# Patient Record
Sex: Female | Born: 1955 | Race: White | Hispanic: No | Marital: Married | Smoking: Former smoker
Health system: Southern US, Community
[De-identification: ages and names within clinical notes are randomized; demographics above are authoritative.]

## PROBLEM LIST (undated history)

## (undated) DIAGNOSIS — K754 Autoimmune hepatitis: Secondary | ICD-10-CM

## (undated) DIAGNOSIS — R9430 Abnormal result of cardiovascular function study, unspecified: Secondary | ICD-10-CM

## (undated) DIAGNOSIS — G609 Hereditary and idiopathic neuropathy, unspecified: Secondary | ICD-10-CM

## (undated) DIAGNOSIS — E119 Type 2 diabetes mellitus without complications: Secondary | ICD-10-CM

## (undated) DIAGNOSIS — K7581 Nonalcoholic steatohepatitis (NASH): Secondary | ICD-10-CM

## (undated) DIAGNOSIS — IMO0002 Reserved for concepts with insufficient information to code with codable children: Secondary | ICD-10-CM

## (undated) DIAGNOSIS — K219 Gastro-esophageal reflux disease without esophagitis: Secondary | ICD-10-CM

## (undated) DIAGNOSIS — E785 Hyperlipidemia, unspecified: Secondary | ICD-10-CM

## (undated) DIAGNOSIS — Z973 Presence of spectacles and contact lenses: Secondary | ICD-10-CM

## (undated) DIAGNOSIS — M1990 Unspecified osteoarthritis, unspecified site: Secondary | ICD-10-CM

## (undated) DIAGNOSIS — K449 Diaphragmatic hernia without obstruction or gangrene: Secondary | ICD-10-CM

## (undated) HISTORY — DX: Abnormal result of cardiovascular function study, unspecified: R94.30

## (undated) HISTORY — PX: VAGINAL HYSTERECTOMY: SUR661

## (undated) HISTORY — DX: Reserved for concepts with insufficient information to code with codable children: IMO0002

## (undated) HISTORY — PX: NASAL SINUS SURGERY: SHX719

## (undated) HISTORY — PX: CHOLECYSTECTOMY: SHX55

## (undated) HISTORY — DX: Type 2 diabetes mellitus without complications: E11.9

## (undated) HISTORY — PX: ESOPHAGOGASTRODUODENOSCOPY: SHX1529

## (undated) HISTORY — DX: Hyperlipidemia, unspecified: E78.5

---

## 1997-04-04 HISTORY — PX: VAGINAL HYSTERECTOMY: SUR661

## 1997-09-26 ENCOUNTER — Other Ambulatory Visit: Admission: RE | Admit: 1997-09-26 | Discharge: 1997-09-26 | Payer: Self-pay | Admitting: Family Medicine

## 1997-10-10 ENCOUNTER — Ambulatory Visit (HOSPITAL_COMMUNITY): Admission: RE | Admit: 1997-10-10 | Discharge: 1997-10-10 | Payer: Self-pay | Admitting: Obstetrics & Gynecology

## 1997-11-18 ENCOUNTER — Observation Stay (HOSPITAL_COMMUNITY): Admission: RE | Admit: 1997-11-18 | Discharge: 1997-11-19 | Payer: Self-pay | Admitting: Obstetrics & Gynecology

## 1999-02-16 ENCOUNTER — Other Ambulatory Visit: Admission: RE | Admit: 1999-02-16 | Discharge: 1999-02-16 | Payer: Self-pay | Admitting: Obstetrics & Gynecology

## 1999-11-22 ENCOUNTER — Encounter (INDEPENDENT_AMBULATORY_CARE_PROVIDER_SITE_OTHER): Payer: Self-pay

## 1999-11-22 ENCOUNTER — Other Ambulatory Visit: Admission: RE | Admit: 1999-11-22 | Discharge: 1999-11-22 | Payer: Self-pay | Admitting: Otolaryngology

## 2000-06-06 ENCOUNTER — Other Ambulatory Visit: Admission: RE | Admit: 2000-06-06 | Discharge: 2000-06-06 | Payer: Self-pay | Admitting: Obstetrics & Gynecology

## 2002-04-16 ENCOUNTER — Encounter: Payer: Self-pay | Admitting: Family Medicine

## 2002-04-16 ENCOUNTER — Ambulatory Visit (HOSPITAL_COMMUNITY): Admission: RE | Admit: 2002-04-16 | Discharge: 2002-04-16 | Payer: Self-pay | Admitting: Family Medicine

## 2002-05-09 ENCOUNTER — Ambulatory Visit (HOSPITAL_COMMUNITY): Admission: RE | Admit: 2002-05-09 | Discharge: 2002-05-09 | Payer: Self-pay | Admitting: General Surgery

## 2002-05-09 ENCOUNTER — Encounter: Payer: Self-pay | Admitting: General Surgery

## 2002-06-05 ENCOUNTER — Encounter: Payer: Self-pay | Admitting: Gastroenterology

## 2003-04-05 HISTORY — PX: LIVER BIOPSY: SHX301

## 2003-09-02 ENCOUNTER — Other Ambulatory Visit: Admission: RE | Admit: 2003-09-02 | Discharge: 2003-09-02 | Payer: Self-pay | Admitting: Obstetrics & Gynecology

## 2003-09-05 ENCOUNTER — Encounter: Payer: Self-pay | Admitting: Gastroenterology

## 2003-09-15 ENCOUNTER — Encounter: Payer: Self-pay | Admitting: Gastroenterology

## 2003-10-07 ENCOUNTER — Encounter (INDEPENDENT_AMBULATORY_CARE_PROVIDER_SITE_OTHER): Payer: Self-pay | Admitting: Specialist

## 2003-10-07 ENCOUNTER — Ambulatory Visit (HOSPITAL_COMMUNITY): Admission: RE | Admit: 2003-10-07 | Discharge: 2003-10-07 | Payer: Self-pay | Admitting: Gastroenterology

## 2003-10-07 ENCOUNTER — Encounter: Payer: Self-pay | Admitting: Gastroenterology

## 2003-12-25 ENCOUNTER — Encounter: Payer: Self-pay | Admitting: Gastroenterology

## 2004-01-30 ENCOUNTER — Encounter: Admission: RE | Admit: 2004-01-30 | Discharge: 2004-01-30 | Payer: Self-pay | Admitting: Gastroenterology

## 2004-02-05 ENCOUNTER — Ambulatory Visit: Payer: Self-pay | Admitting: Gastroenterology

## 2004-02-17 ENCOUNTER — Ambulatory Visit: Payer: Self-pay | Admitting: Gastroenterology

## 2004-02-19 ENCOUNTER — Encounter: Payer: Self-pay | Admitting: Gastroenterology

## 2004-02-25 ENCOUNTER — Ambulatory Visit: Payer: Self-pay | Admitting: Gastroenterology

## 2004-03-02 ENCOUNTER — Ambulatory Visit: Payer: Self-pay | Admitting: Gastroenterology

## 2004-03-09 ENCOUNTER — Ambulatory Visit: Payer: Self-pay | Admitting: Gastroenterology

## 2004-04-08 ENCOUNTER — Ambulatory Visit: Payer: Self-pay | Admitting: Gastroenterology

## 2004-06-22 ENCOUNTER — Ambulatory Visit: Payer: Self-pay | Admitting: Gastroenterology

## 2004-07-13 ENCOUNTER — Ambulatory Visit: Payer: Self-pay | Admitting: Gastroenterology

## 2005-04-13 ENCOUNTER — Encounter: Admission: RE | Admit: 2005-04-13 | Discharge: 2005-04-19 | Payer: Self-pay | Admitting: Family Medicine

## 2005-10-11 ENCOUNTER — Emergency Department (HOSPITAL_COMMUNITY): Admission: EM | Admit: 2005-10-11 | Discharge: 2005-10-12 | Payer: Self-pay | Admitting: Emergency Medicine

## 2006-04-04 HISTORY — PX: CHOLECYSTECTOMY: SHX55

## 2006-09-25 ENCOUNTER — Emergency Department (HOSPITAL_COMMUNITY): Admission: EM | Admit: 2006-09-25 | Discharge: 2006-09-25 | Payer: Self-pay | Admitting: Emergency Medicine

## 2008-07-08 ENCOUNTER — Encounter: Payer: Self-pay | Admitting: Gastroenterology

## 2008-08-07 ENCOUNTER — Encounter: Payer: Self-pay | Admitting: Gastroenterology

## 2008-09-04 DIAGNOSIS — K449 Diaphragmatic hernia without obstruction or gangrene: Secondary | ICD-10-CM | POA: Insufficient documentation

## 2008-09-04 DIAGNOSIS — E782 Mixed hyperlipidemia: Secondary | ICD-10-CM | POA: Insufficient documentation

## 2008-09-04 DIAGNOSIS — K754 Autoimmune hepatitis: Secondary | ICD-10-CM | POA: Insufficient documentation

## 2008-09-04 DIAGNOSIS — I10 Essential (primary) hypertension: Secondary | ICD-10-CM

## 2008-09-04 DIAGNOSIS — I152 Hypertension secondary to endocrine disorders: Secondary | ICD-10-CM | POA: Insufficient documentation

## 2008-09-04 DIAGNOSIS — E1169 Type 2 diabetes mellitus with other specified complication: Secondary | ICD-10-CM | POA: Insufficient documentation

## 2008-09-04 DIAGNOSIS — E785 Hyperlipidemia, unspecified: Secondary | ICD-10-CM

## 2008-09-04 DIAGNOSIS — K219 Gastro-esophageal reflux disease without esophagitis: Secondary | ICD-10-CM | POA: Insufficient documentation

## 2008-09-05 ENCOUNTER — Ambulatory Visit: Payer: Self-pay | Admitting: Gastroenterology

## 2008-09-05 DIAGNOSIS — R1011 Right upper quadrant pain: Secondary | ICD-10-CM | POA: Insufficient documentation

## 2008-09-05 DIAGNOSIS — R112 Nausea with vomiting, unspecified: Secondary | ICD-10-CM | POA: Insufficient documentation

## 2008-09-05 LAB — CONVERTED CEMR LAB
Ferritin: 70.8 ng/mL (ref 10.0–291.0)
Folate: 9.2 ng/mL
Iron: 146 ug/dL — ABNORMAL HIGH (ref 42–145)
Saturation Ratios: 37.5 % (ref 20.0–50.0)
Transferrin: 278.1 mg/dL (ref 212.0–360.0)
Vitamin B-12: 264 pg/mL (ref 211–911)

## 2008-09-08 ENCOUNTER — Encounter: Payer: Self-pay | Admitting: Gastroenterology

## 2008-09-08 LAB — CONVERTED CEMR LAB
IgA: 253 mg/dL (ref 68–378)
Tissue Transglutaminase Ab, IgA: 1.6 units (ref <7)

## 2008-09-19 ENCOUNTER — Encounter: Payer: Self-pay | Admitting: Gastroenterology

## 2008-09-19 ENCOUNTER — Ambulatory Visit: Payer: Self-pay | Admitting: Gastroenterology

## 2008-09-23 ENCOUNTER — Encounter: Payer: Self-pay | Admitting: Gastroenterology

## 2008-10-03 ENCOUNTER — Ambulatory Visit: Payer: Self-pay | Admitting: Gastroenterology

## 2009-04-27 ENCOUNTER — Encounter: Payer: Self-pay | Admitting: Gastroenterology

## 2010-05-04 NOTE — Letter (Signed)
Summary: [facility]   Imported By: Sherian Rein 05/14/2009 10:49:08  _____________________________________________________________________  External Attachment:    Type:   Image     Comment:   External Document

## 2010-10-23 ENCOUNTER — Other Ambulatory Visit: Payer: Self-pay

## 2010-10-23 ENCOUNTER — Observation Stay (HOSPITAL_COMMUNITY)
Admission: EM | Admit: 2010-10-23 | Discharge: 2010-10-24 | Disposition: A | Discharge status: Home / Self Care | Attending: Internal Medicine | Admitting: Internal Medicine

## 2010-10-23 ENCOUNTER — Emergency Department (HOSPITAL_COMMUNITY)

## 2010-10-23 ENCOUNTER — Encounter: Payer: Self-pay | Admitting: *Deleted

## 2010-10-23 DIAGNOSIS — R079 Chest pain, unspecified: Secondary | ICD-10-CM | POA: Diagnosis present

## 2010-10-23 DIAGNOSIS — K219 Gastro-esophageal reflux disease without esophagitis: Secondary | ICD-10-CM | POA: Diagnosis present

## 2010-10-23 DIAGNOSIS — R0789 Other chest pain: Principal | ICD-10-CM | POA: Insufficient documentation

## 2010-10-23 DIAGNOSIS — E785 Hyperlipidemia, unspecified: Secondary | ICD-10-CM | POA: Insufficient documentation

## 2010-10-23 DIAGNOSIS — E1169 Type 2 diabetes mellitus with other specified complication: Secondary | ICD-10-CM | POA: Diagnosis present

## 2010-10-23 DIAGNOSIS — K754 Autoimmune hepatitis: Secondary | ICD-10-CM | POA: Insufficient documentation

## 2010-10-23 DIAGNOSIS — M7989 Other specified soft tissue disorders: Secondary | ICD-10-CM | POA: Insufficient documentation

## 2010-10-23 DIAGNOSIS — I10 Essential (primary) hypertension: Secondary | ICD-10-CM | POA: Insufficient documentation

## 2010-10-23 DIAGNOSIS — E782 Mixed hyperlipidemia: Secondary | ICD-10-CM | POA: Diagnosis present

## 2010-10-23 HISTORY — DX: Unspecified osteoarthritis, unspecified site: M19.90

## 2010-10-23 HISTORY — DX: Gastro-esophageal reflux disease without esophagitis: K21.9

## 2010-10-23 HISTORY — DX: Autoimmune hepatitis: K75.4

## 2010-10-23 LAB — BASIC METABOLIC PANEL
BUN: 16 mg/dL (ref 6–23)
CO2: 25 mEq/L (ref 19–32)
Chloride: 101 mEq/L (ref 96–112)
Creatinine, Ser: 0.92 mg/dL (ref 0.50–1.10)
GFR calc Af Amer: >60 mL/min (ref >60)
Glucose, Bld: 110 mg/dL — ABNORMAL HIGH (ref 70–99)

## 2010-10-23 LAB — CARDIAC PANEL(CRET KIN+CKTOT+MB+TROPI)
CK, MB: 2.4 ng/mL (ref 0.3–4.0)
Relative Index: 2.3 (ref 0.0–2.5)
Relative Index: INVALID (ref 0.0–2.5)
Total CK: 98 U/L (ref 7–177)
Troponin I: <0.3 ng/mL (ref <0.30)
Troponin I: <0.3 ng/mL (ref <0.30)

## 2010-10-23 LAB — CBC
HCT: 39.5 % (ref 36.0–46.0)
Hemoglobin: 13.5 g/dL (ref 12.0–15.0)
MCV: 96.6 fL (ref 78.0–100.0)
RDW: 12.8 % (ref 11.5–15.5)
WBC: 6.2 10*3/uL (ref 4.0–10.5)

## 2010-10-23 MED ORDER — SODIUM CHLORIDE 0.9 % IJ SOLN: 3.0000 mL | INTRAMUSCULAR | Status: DC | PRN

## 2010-10-23 MED ORDER — ACETAMINOPHEN 650 MG RE SUPP: 650.0000 mg | Freq: Four times a day (QID) | RECTAL | Status: DC | PRN

## 2010-10-23 MED ORDER — ACETAMINOPHEN 325 MG PO TABS: 650.0000 mg | ORAL_TABLET | Freq: Four times a day (QID) | ORAL | Status: DC | PRN

## 2010-10-23 MED ORDER — SODIUM CHLORIDE 0.9 % IJ SOLN
3.0000 mL | Freq: Two times a day (BID) | INTRAMUSCULAR | Status: DC
  Administered 2010-10-23: 3 mL via INTRAVENOUS
  Filled 2010-10-23: qty 3

## 2010-10-23 MED ORDER — SENNA 8.6 MG PO TABS: 2.0000 | ORAL_TABLET | Freq: Every day | ORAL | Status: DC | PRN

## 2010-10-23 MED ORDER — ZOLPIDEM TARTRATE 5 MG PO TABS: 5.0000 mg | ORAL_TABLET | Freq: Every evening | ORAL | Status: DC | PRN

## 2010-10-23 MED ORDER — ASPIRIN 325 MG PO TABS
325.0000 mg | ORAL_TABLET | ORAL | Status: AC
  Administered 2010-10-23: 325 mg via ORAL
  Filled 2010-10-23: qty 1

## 2010-10-23 MED ORDER — ASPIRIN 81 MG PO CHEW: 324.0000 mg | CHEWABLE_TABLET | Freq: Once | ORAL | Status: DC

## 2010-10-23 MED ORDER — PANTOPRAZOLE SODIUM 40 MG PO TBEC
40.0000 mg | DELAYED_RELEASE_TABLET | Freq: Two times a day (BID) | ORAL | Status: DC
  Administered 2010-10-24: 40 mg via ORAL
  Filled 2010-10-23: qty 1

## 2010-10-23 MED ORDER — MERCAPTOPURINE 50 MG PO TABS
50.0000 mg | ORAL_TABLET | Freq: Every day | ORAL | Status: DC
  Administered 2010-10-23: 50 mg via ORAL
  Filled 2010-10-23 (×3): qty 1

## 2010-10-23 MED ORDER — ONDANSETRON HCL 4 MG/2ML IJ SOLN: 4.0000 mg | Freq: Four times a day (QID) | INTRAMUSCULAR | Status: DC | PRN

## 2010-10-23 MED ORDER — ONDANSETRON HCL 4 MG PO TABS: 4.0000 mg | ORAL_TABLET | Freq: Four times a day (QID) | ORAL | Status: DC | PRN

## 2010-10-23 MED ORDER — SODIUM CHLORIDE 0.9 % IV SOLN: 250.0000 mL | INTRAVENOUS | Status: DC

## 2010-10-23 NOTE — ED Provider Notes (Signed)
Evaluation and management procedures were performed by the PA/NP under my supervision/collaboration.     Dione Booze, MD 10/23/10 [phone]

## 2010-10-23 NOTE — ED Provider Notes (Signed)
History     Chief Complaint  Patient presents with  . Chest Pain   HPI Comments: Pt reports problem with fleeting chest pain for the past 2 months that were mild, would come, last 40 to 45 sec, and go away. Today she felt the mild pain followed by a much more severe pain with tightness that gradually went away with rest. She go up to finish working in the kitchen and the more severe pain with tightness came again. She had mild to mod shortness of breath. No diaphoresis. No vomiting, and no LOC.  Patient is a 55 y.o. female presenting with chest pain. The history is provided by the patient.  Chest Pain The chest pain began 1 - 2 hours ago. Chest pain occurs constantly. The chest pain is unchanged. Associated with: working in [employer]. The severity of the pain is moderate. The quality of the pain is described as sharp and tightness. The pain does not radiate. Pertinent negatives for primary symptoms include no shortness of breath, no cough, no wheezing, no palpitations, no abdominal pain and no dizziness.  Pertinent negatives for past medical history include no seizures.     Past Medical History  Diagnosis Date  . Autoimmune hepatitis   . GERD (gastroesophageal reflux disease)     Past Surgical History  Procedure Date  . Abdominal hysterectomy   . Cholecystectomy   . Nasal sinus surgery     History reviewed. No pertinent family history.  History  Substance Use Topics  . Smoking status: Never Smoker   . Smokeless tobacco: Not on file  . Alcohol Use: No    OB History    Grav Para Term Preterm Abortions TAB SAB Ect Mult Living                  Review of Systems  Constitutional: Negative for activity change.       All ROS Neg except as noted in HPI  HENT: Negative for nosebleeds and neck pain.   Eyes: Negative for photophobia and discharge.  Respiratory: Positive for chest tightness. Negative for cough, shortness of breath and wheezing.   Cardiovascular: Positive for chest  pain and leg swelling. Negative for palpitations.  Gastrointestinal: Negative for abdominal pain and blood in stool.       Indigestion  Genitourinary: Negative for dysuria, frequency and hematuria.  Musculoskeletal: Negative for back pain and arthralgias.  Skin: Negative.   Neurological: Negative for dizziness, seizures and speech difficulty.  Psychiatric/Behavioral: Negative for hallucinations and confusion.    Physical Exam  BP 153/64  Pulse 78  Resp 16  Ht 5' 5.5" (1.664 m)  Wt 220 lb (99.791 kg)  BMI 36.05 kg/m2  SpO2 98%  Physical Exam  Nursing note and vitals reviewed. Constitutional: She is oriented to person, place, and time. She appears well-developed and well-nourished.  Non-toxic appearance.  HENT:  Head: Normocephalic.  Right Ear: Tympanic membrane and external ear normal.  Left Ear: Tympanic membrane and external ear normal.  Eyes: EOM and lids are normal. Pupils are equal, round, and reactive to light.  Neck: Normal range of motion. Neck supple. Carotid bruit is not present.       No carotid bruits  Cardiovascular: Normal rate, regular rhythm, normal heart sounds, intact distal pulses and normal pulses.   Pulmonary/Chest: Breath sounds normal. No respiratory distress. She exhibits no tenderness.  Abdominal: Soft. Bowel sounds are normal. There is no tenderness. There is no guarding.  Musculoskeletal: Normal range of motion.  She exhibits no edema.  Lymphadenopathy:       Head (right side): No submandibular adenopathy present.       Head (left side): No submandibular adenopathy present.    She has no cervical adenopathy.  Neurological: She is alert and oriented to person, place, and time. She has normal strength. No cranial nerve deficit or sensory deficit.  Skin: Skin is warm and dry.  Psychiatric: She has a normal mood and affect. Her speech is normal.    ED Course  Procedures  MDM I have reviewed nursing notes, vital signs, and all appropriate lab and  imaging results for this patient.      Kathie Dike, [place] 10/23/10 1441

## 2010-10-23 NOTE — H&P (Signed)
Taylor Leach [id number] 12/22/1955  Referring physician: PCP: Edilia Bo, PA   Chief Complaint: Chest Pain  HPI: 55 year old female with past medical history of GERD hyperlipidemia also with  autoimmune hepatitis that comes in for atypical chest pain. She relates that for the past 4 days she's been having intermittent chest pains. On her left side. She relates this morning when she was making breakfast she started having these intermittent chest spasms. Resting for about 30 minutes. She relates she was not doing any heavy exertion, she relates nothing makes it better, nothing makes it worse. She has no recent viral  infections. She has no shortness of breath, palpitation, dizziness, nausea, no recent sick contacts and no change in her vision.  Past Medical History  Diagnosis Date  . Autoimmune hepatitis   . GERD (gastroesophageal reflux disease)    Past Surgical History  Procedure Date  . Abdominal hysterectomy   . Cholecystectomy   . Nasal sinus surgery    Social History:  reports that she has never smoked. She does not have any smokeless tobacco history on file. She reports that she drinks about .6 ounces of alcohol per week. She reports that she does not use illicit drugs. Allergies:  Allergies  Allergen Reactions  . Hydrocodone    Family History  Problem Relation Age of Onset  . Cancer Mother   . Cancer Father    Prior to Admission medications   Medication Sig Start Date End Date Taking? Authorizing Provider  mercaptopurine (PURINETHOL) 50 MG tablet Take 50 mg by mouth daily. Give on an empty stomach 1 hour before or 2 hours after meals. Caution: Chemotherapy.    Yes Historical Provider, MD  naproxen (NAPROSYN) 500 MG tablet Take 500 mg by mouth 2 (two) times daily with a meal.     Yes Historical Provider, MD  omeprazole (PRILOSEC) 20 MG capsule Take 20 mg by mouth daily.     Yes Historical Provider, MD   Review of Systems:  10 point review of systems aren't pertinent  positive per history of present illness.  Physical Exam: General: Lying comfortably in bed in no acute distress Eyes: Anicteric pallor ENT: Moist mucous membranes, no discharges Neck: no JVD no bruits no thyroid Cardiovascular: Shows regular rate and rhythm with positive S1-S2 no tenderness to palpation of her chest. Respiratory: Good air movement clear to auscultation Abdomen: Positive bowel sounds nontender nondistended soft. Skin: No rashes ulcerations Musculoskeletal: Moving all 4 extremities without any difficulty. No joint deformities  Neurologic: Alert and oriented x4 coherent for language and nonfocal.  Labs on Admission:  Results for orders placed during the hospital encounter of 10/23/10 (from the past 24 hour(s))  CBC     Status: Normal   Collection Time   10/23/10 12:23 PM      Component Value Range   WBC 6.2  4.0 - 10.5 (K/uL)   RBC 4.09  3.87 - 5.11 (MIL/uL)   Hemoglobin 13.5  12.0 - 15.0 (g/dL)   HCT 40.9  81.1 - 91.4 (%)   MCV 96.6  78.0 - 100.0 (fL)   MCH 33.0  26.0 - 34.0 (pg)   MCHC 34.2  30.0 - 36.0 (g/dL)   RDW 78.2  95.6 - 21.3 (%)   Platelets 248  150 - 400 (K/uL)  BASIC METABOLIC PANEL     Status: Abnormal   Collection Time   10/23/10 12:23 PM      Component Value Range   Sodium 136  135 -  145 (mEq/L)   Potassium 3.8  3.5 - 5.1 (mEq/L)   Chloride 101  96 - 112 (mEq/L)   CO2 25  19 - 32 (mEq/L)   Glucose, Bld 110 (*) 70 - 99 (mg/dL)   BUN 16  6 - 23 (mg/dL)   Creatinine, Ser 2.13  0.50 - 1.10 (mg/dL)   Calcium 9.3  8.4 - 08.6 (mg/dL)   GFR calc non Af Amer >60  >60 (mL/min)   GFR calc Af Amer >60  >60 (mL/min)  CARDIAC PANEL(CRET KIN+CKTOT+MB+TROPI)     Status: Normal   Collection Time   10/23/10 12:23 PM      Component Value Range   Total CK 115  7 - 177 (U/L)   CK, MB 2.7  0.3 - 4.0 (ng/mL)   Troponin I <0.30  <0.30 (ng/mL)   Relative Index 2.3  0.0 - 2.5   CARDIAC PANEL(CRET KIN+CKTOT+MB+TROPI)     Status: Normal   Collection Time   10/23/10   3:10 PM      Component Value Range   Total CK 98  7 - 177 (U/L)   CK, MB 2.4  0.3 - 4.0 (ng/mL)   Troponin I <0.30  <0.30 (ng/mL)   Relative Index RELATIVE INDEX IS INVALID  0.0 - 2.5     Radiological Exams on Admission: No results found.  EKG:  shows normal sinus rhythm good R-wave progression normal axis no T wave inversion no ST segment changes.   Assessment/Plan 1. Atypical chest pain 55 year old female with past medical history of autoimmune hepatitis and GERD. The most likely cause of chest pain is GERD. She is on a PPI. I would admit her for 23 hour observation. Cycle cardiac enzymes repeat an EKG in the morning. I will increase her protonix to twice a day. And watch how she does overnight. If everything comes back within normal limits. We'll schedule a stress as an outpatient. 2. GERD increase PPI to twice a day 3. auto immune hepatitis. Continue current meds.    Code Status: Family Communication: Disposition Plan:     Marinda Elk MD 10/23/2010, 5:24 PM

## 2010-10-23 NOTE — ED Notes (Signed)
Patient is resting comfortably. 

## 2010-10-23 NOTE — ED Notes (Signed)
remains pain free, for admission

## 2010-10-23 NOTE — Progress Notes (Signed)
Case discussed wth Dr Chryl Heck. He will see pt in ED for admission. Pt sitting in room with family. No distress. No c/o pain at this time.

## 2010-10-23 NOTE — ED Notes (Signed)
Pt states intermittent, non radiating  pain to center of chest. Described as "tightening up for about 30-45 seconds, then goes away"  NAD. Denies SOB

## 2010-10-23 NOTE — ED Notes (Signed)
Remains pain free. Talking w/family, waiting for re-eval

## 2010-10-23 NOTE — ED Notes (Signed)
To room via stretcher

## 2010-10-23 NOTE — ED Notes (Signed)
Date: 10/23/2010  Rate: 80  Rhythm: normal sinus rhythm  QRS Axis: normal  Intervals: normal  ST/T Wave abnormalities: normal  Conduction Disutrbances:none  Narrative Interpretation: Normal ECG  Old EKG Reviewed: none available   Dione Booze, MD 10/23/10 1554

## 2010-10-23 NOTE — ED Notes (Signed)
edpa in with pt 

## 2010-10-23 NOTE — ED Notes (Signed)
Patient denies pain and is resting comfortably.  

## 2010-10-24 ENCOUNTER — Other Ambulatory Visit: Payer: Self-pay

## 2010-10-24 LAB — CARDIAC PANEL(CRET KIN+CKTOT+MB+TROPI)
CK, MB: 1.9 ng/mL (ref 0.3–4.0)
CK, MB: 1.8 ng/mL (ref 0.3–4.0)
Relative Index: INVALID (ref 0.0–2.5)
Relative Index: INVALID (ref 0.0–2.5)
Total CK: 69 U/L (ref 7–177)
Total CK: 65 U/L (ref 7–177)

## 2010-10-24 LAB — LIPID PANEL
Cholesterol: 212 mg/dL — ABNORMAL HIGH (ref 0–200)
HDL: 52 mg/dL (ref >39)
Total CHOL/HDL Ratio: 4.1 RATIO

## 2010-10-24 MED ORDER — ASPIRIN 81 MG PO CHEW: 81.0000 mg | CHEWABLE_TABLET | Freq: Once | ORAL | Status: DC

## 2010-10-24 MED ORDER — OMEPRAZOLE 20 MG PO CPDR: 40.0000 mg | DELAYED_RELEASE_CAPSULE | Freq: Two times a day (BID) | ORAL | Status: DC

## 2010-10-24 MED ORDER — SENNA 8.6 MG PO TABS: 2.0000 | ORAL_TABLET | Freq: Every day | ORAL | Status: DC | PRN

## 2010-10-24 NOTE — Progress Notes (Signed)
Evaluation and management procedures were performed by the PA/NP under my supervision/collaboration.   

## 2010-10-24 NOTE — Progress Notes (Signed)
10/24/10 0935 Patient discharged home this morning. Reviewed discharge instructions with patient, given copy of instructions, med list, prescription, carenotes, f/u appointment information. Pt to f/u with cardiology for outpatient echo. Denies pain or discomfort this am, verbalized understanding of instructions. Telemetry and IV site d/c'd per nurse tech this am, site within normal limits. Pt left floor in stable condition via ambulation, accompanied by nurse tech. Lewis Shock, RN

## 2010-10-24 NOTE — Discharge Summary (Signed)
Taylor Leach MRN: [id number] DOB/AGE: [birth year] 55 y.o.  Admit date: 11/10/2010 Discharge date: 10/26/2010  Primary Care Physician:  Edilia Bo, PA   Discharge Diagnoses:   Patient Active Problem List  Diagnoses  . HYPERLIPIDEMIA  . HYPERTENSION  . HIATAL HERNIA  . AUTOIMMUNE HEPATITIS  . NAUSEA AND VOMITING  . NAUSEA  . RUQ PAIN  . Autoimmune hepatitis    DISCHARGE MEDICATION: Discharge Medication List as of 10/24/2010  9:13 AM    START taking these medications   Details  aspirin 81 MG chewable tablet Chew 1 tablet (81 mg total) by mouth once., Starting 10/24/2010, Until Mon 10/24/11, OTC    senna (SENOKOT) 8.6 MG TABS Take 2 tablets (17.2 mg total) by mouth daily as needed., Starting 10/24/2010, Until Discontinued, OTC      CONTINUE these medications which have CHANGED   Details  omeprazole (PRILOSEC) 20 MG capsule Take 2 capsules (40 mg total) by mouth 2 (two) times daily., Starting 10/24/2010, Until Discontinued, OTC      CONTINUE these medications which have NOT CHANGED   Details  fluticasone (FLONASE) 50 MCG/ACT nasal spray Place 2 sprays into the nose daily as needed. Sinus Allergies , Until Discontinued, Historical Med    mercaptopurine (PURINETHOL) 50 MG tablet Take 50 mg by mouth daily. Give on an empty stomach 1 hour before or 2 hours after meals. Caution: Chemotherapy., Until Discontinued, Historical Med      STOP taking these medications     naproxen (NAPROSYN) 500 MG tablet      naproxen (NAPROSYN) 500 MG tablet            Consults: Treatment Team:  [facility]   SIGNIFICANT DIAGNOSTIC STUDIES:  Chest Portable 1 View  11-10-2010  *RADIOLOGY REPORT*  Clinical Data: Chest pain.  PORTABLE CHEST - 1 VIEW  Comparison: 10/11/2005  Findings: Low lung volumes are present, causing crowding of the pulmonary vasculature.  Cardiac and mediastinal contours appear normal.  The lungs appear clear.  No pleural effusion is identified.  IMPRESSION:  No  significant abnormality identified.  Original Report Authenticated By: Dellia Cloud, M.D.           BRIEF ADMITTING H & P;  55 year old female with past medical history of GERD hyperlipidemia also with autoimmune hepatitis that comes in for atypical chest pain. She relates that for the past 4 days she's been having intermittent chest pains. On her left side. She relates this morning when she was making breakfast she started having these intermittent chest spasms. Resting for about 30 minutes. She relates she was not doing any heavy exertion, she relates nothing makes it better, nothing makes it worse. She has no recent viral infections. She has no shortness of breath, palpitation, dizziness, nausea, no recent sick contacts and no change in her vision.    Hospital Course:  Hospital Course by problem list: 1. Chest pain, 55 year old female with past medical history of GERD also autoimmune hepatitis was moved to the floor for atypical chest pains on telemetry strip she had no events. EKG showed normal sinus rhythm EKG revealed T wave inversion. Cardiac enzymes negative x4.  Will followup with Dr. With cardiology as an outpatient for a stress test. 2. HYPERLIPIDEMIA, labs pending will followup with cardiology. 3. GERD, take omeprazole 40 mg twice a day. Stop taking naproxen. Use Tylenol when necessary.  Disposition and Follow-up:  Discharge Orders    Future Appointments: Provider: Department: Dept Phone: Center:   12/10/2010 2:00 PM Tinnie Gens  Sedonia Small, MD [facility] [phone] [facility]     Future Orders Please Complete By Expires   Diet general      Increase activity slowly      Discharge instructions      Comments:   Taking NSAIDs. (Like naproxen) Increase activity as tolerated   Call MD for:      Scheduling Instructions:   With her primary care doctor as routine.      DISCHARGE EXAM:  BP 123/66  Pulse 63  Temp(Src) 98.1 F (36.7 C) (Oral)  Resp 16  Ht 5\' 5"   (1.651 m)  Wt 102.4 kg (225 lb 12 oz)  BMI 37.57 kg/m2  SpO2 97%  General: Lying comfortably in bed in no acute distress  Eyes: Anicteric pallor  ENT: Moist mucous membranes, no discharges  Neck: no JVD no bruits no thyroid  Cardiovascular: Shows regular rate and rhythm with positive S1-S2 no tenderness to palpation of her chest.  Respiratory: Good air movement clear to auscultation  Abdomen: Positive bowel sounds nontender nondistended soft.  Skin: No rashes ulcerations  Musculoskeletal: Moving all 4 extremities without any difficulty. No joint deformities  Neurologic: Alert and oriented x4 coherent for language     Labs on discharge: Cardiac markers continue to be negative.      Signed: Marinda Elk 10/26/2010, 1:25 PM

## 2010-12-02 NOTE — Progress Notes (Signed)
Encounter addended by: Clarene Critchley on: 12/02/2010  6:55 AM<BR>     Documentation filed: Flowsheet VN

## 2010-12-08 ENCOUNTER — Encounter: Payer: Self-pay | Admitting: Cardiology

## 2010-12-10 ENCOUNTER — Ambulatory Visit: Payer: Self-pay | Admitting: Cardiology

## 2010-12-10 NOTE — Progress Notes (Signed)
Encounter addended by: Karen Kays on: 12/10/2010 10:10 AM<BR>     Documentation filed: Flowsheet VN

## 2011-01-03 ENCOUNTER — Encounter: Payer: Self-pay | Admitting: Cardiology

## 2011-01-05 ENCOUNTER — Encounter: Payer: Self-pay | Admitting: Cardiology

## 2011-01-07 ENCOUNTER — Encounter: Payer: Self-pay | Admitting: Cardiology

## 2011-01-07 ENCOUNTER — Other Ambulatory Visit: Payer: Self-pay | Admitting: Cardiology

## 2011-01-07 ENCOUNTER — Telehealth: Payer: Self-pay | Admitting: *Deleted

## 2011-01-07 ENCOUNTER — Ambulatory Visit (INDEPENDENT_AMBULATORY_CARE_PROVIDER_SITE_OTHER): Admitting: Cardiology

## 2011-01-07 ENCOUNTER — Encounter: Payer: Self-pay | Admitting: *Deleted

## 2011-01-07 DIAGNOSIS — R079 Chest pain, unspecified: Secondary | ICD-10-CM | POA: Insufficient documentation

## 2011-01-07 DIAGNOSIS — E785 Hyperlipidemia, unspecified: Secondary | ICD-10-CM

## 2011-01-07 DIAGNOSIS — I10 Essential (primary) hypertension: Secondary | ICD-10-CM

## 2011-01-07 NOTE — Progress Notes (Signed)
HPI The patient is seen today as a new patient.  She had been hospitalized overnight at [facility]  for evaluation of chest pain. There was no MI.  There is a plan for her to be seen by cardiology as an outpatient.  This was delayed because of illness with her husband.  She's been stable.  She does continue to have chest discomfort.  It is random.  It is not related to exertion.  She does not have any at rest at night.  There is no shortness of breath.  There is no nausea vomiting or diaphoresis.  Patient does have a history of autoimmune hepatitis and she is treated for this.  There is also hypertension and hyperlipidemia.  As part of his evaluation I have reviewed all the data available to me including her discharge summary from the hospital in July.  Allergies  Allergen Reactions  . Hydrocodone Nausea And Vomiting and Other (See Comments)    Makes body hot.     Current Outpatient Prescriptions  Medication Sig Dispense Refill  . fluticasone (FLONASE) 50 MCG/ACT nasal spray Place 2 sprays into the nose daily as needed. Sinus Allergies       . ibuprofen (ADVIL,MOTRIN) 200 MG tablet Take 200 mg by mouth every 6 (six) hours as needed.        . mercaptopurine (PURINETHOL) 50 MG tablet Take 50 mg by mouth daily. Give on an empty stomach 1 hour before or 2 hours after meals. Caution: Chemotherapy.      Marland Kitchen omeprazole (PRILOSEC) 20 MG capsule Take 2 capsules (40 mg total) by mouth 2 (two) times daily.        History   Social History  . Marital Status: Married    Spouse Name: N/A    Number of Children: N/A  . Years of Education: N/A   Occupational History  . Not on file.   Social History Main Topics  . Smoking status: Former Smoker -- 1.0 packs/day  . Smokeless tobacco: Not on file  . Alcohol Use: 0.6 oz/week    1 Glasses of wine per week  . Drug Use: No  . Sexually Active: Yes    Birth Control/ Protection: Surgical   Other Topics Concern  . Not on file   Social History Narrative    . No narrative on file    Family History  Problem Relation Age of Onset  . Cancer Mother   . Cancer Father     Past Medical History  Diagnosis Date  . Autoimmune hepatitis   . GERD (gastroesophageal reflux disease)   . Arthritis   . Chest pain   . Dyslipidemia     Past Surgical History  Procedure Date  . Cholecystectomy   . Nasal sinus surgery   . Vaginal hysterectomy     ROS  Patient denies fever, chills, headache, sweats, rash, change in vision, change in hearing, chest pain, cough, nausea vomiting, urinary symptoms.  All other systems are reviewed and are negative.  PHYSICAL EXAM Patient is overweight and quite stable.  She is oriented to person time and place.  Affect is normal.  Head is atraumatic.  There is no xanthelasma.  There is no jugular venous distention.  Lungs are clear.  Respiratory effort is nonlabored.  Cardiac exam reveals S1 and S2.  There are no clicks or significant murmurs.  The abdomen is soft there is no peripheral edema.  No musculoskeletal deformities.  No skin rashes  Filed Vitals:  01/07/11 0820  BP: 131/74  Pulse: 73  Resp: 16  Height: 5\' 5"  (1.651 m)  Weight: 221 lb (100.245 kg)    EKG Hospital dated to light the 22nd 2012 is reviewed.  It was normal.  I have reviewed EKG as she has had some chest discomfort since that time.  I reviewed today's EKG.  It is unchanged.  It is normal. ASSESSMENT & PLAN

## 2011-01-07 NOTE — Assessment & Plan Note (Signed)
Patient has only hepatitis.  She does not have proven coronary disease.  I presume this is why she is not on a statin

## 2011-01-07 NOTE — Assessment & Plan Note (Signed)
Blood pressure is controlled. No change in therapy. 

## 2011-01-07 NOTE — Patient Instructions (Addendum)
   Echo  Stress Echo  If the results of your test are normal or stable, you will receive a letter.  If they are abnormal, the nurse will contact you by phone. Follow up in  3 weeks - see above

## 2011-01-07 NOTE — Telephone Encounter (Signed)
No precert required 

## 2011-01-07 NOTE — Assessment & Plan Note (Signed)
Patient is seen for evaluation of chest pain.  When she was in the hospital briefly overnight there was no enzyme abnormality.  She continues to have some mild symptoms.  We do not have any echo data or exercise at this point.  I believe that an echo and a stress echo would be the most appropriate approach and this will be arranged.

## 2011-01-07 NOTE — Telephone Encounter (Signed)
Stress Echo and 2 D echo scheduled for 01-14-2011 @ [facility] Checking percert

## 2011-01-14 DIAGNOSIS — R072 Precordial pain: Secondary | ICD-10-CM

## 2011-01-19 LAB — URINE CULTURE
Colony Count: NO GROWTH
Culture: NO GROWTH

## 2011-01-19 LAB — COMPREHENSIVE METABOLIC PANEL
ALT: 67 — ABNORMAL HIGH
AST: 30
Albumin: 3.2 — ABNORMAL LOW
Alkaline Phosphatase: 42
BUN: 12
CO2: 27
Calcium: 8.9
Chloride: 104
Creatinine, Ser: 0.89
GFR calc Af Amer: >60
GFR calc non Af Amer: >60
Glucose, Bld: 137 — ABNORMAL HIGH
Potassium: 3.9
Sodium: 135
Total Bilirubin: 0.9
Total Protein: 6

## 2011-01-19 LAB — DIFFERENTIAL
Band Neutrophils: 0
Basophils Relative: 0
Eosinophils Relative: 1
Metamyelocytes Relative: 0
Myelocytes: 0
Neutrophils Relative %: 75

## 2011-01-19 LAB — CBC
HCT: 41.7
Hemoglobin: 14.1
MCHC: 33.7
MCV: 106.4 — ABNORMAL HIGH
Platelets: 255
RBC: 3.92
RDW: 14.3 — ABNORMAL HIGH
WBC: 7

## 2011-01-19 LAB — URINE MICROSCOPIC-ADD ON

## 2011-01-19 LAB — URINALYSIS, ROUTINE W REFLEX MICROSCOPIC
Bilirubin Urine: NEGATIVE
Glucose, UA: NEGATIVE
Ketones, ur: NEGATIVE
Protein, ur: NEGATIVE
pH: 5.5

## 2011-02-17 ENCOUNTER — Encounter: Payer: Self-pay | Admitting: Cardiology

## 2011-02-18 ENCOUNTER — Encounter: Payer: Self-pay | Admitting: Cardiology

## 2011-02-18 ENCOUNTER — Ambulatory Visit: Admitting: Cardiology

## 2012-04-04 HISTORY — PX: RECTOCELE REPAIR: SHX761

## 2012-08-26 ENCOUNTER — Emergency Department (HOSPITAL_COMMUNITY)

## 2012-08-26 ENCOUNTER — Emergency Department (HOSPITAL_COMMUNITY)
Admission: EM | Admit: 2012-08-26 | Discharge: 2012-08-26 | Disposition: A | Discharge status: Home / Self Care | Attending: Emergency Medicine | Admitting: Emergency Medicine

## 2012-08-26 ENCOUNTER — Encounter (HOSPITAL_COMMUNITY): Payer: Self-pay | Admitting: Emergency Medicine

## 2012-08-26 DIAGNOSIS — Z862 Personal history of diseases of the blood and blood-forming organs and certain disorders involving the immune mechanism: Secondary | ICD-10-CM | POA: Insufficient documentation

## 2012-08-26 DIAGNOSIS — H539 Unspecified visual disturbance: Secondary | ICD-10-CM | POA: Insufficient documentation

## 2012-08-26 DIAGNOSIS — Z79899 Other long term (current) drug therapy: Secondary | ICD-10-CM | POA: Insufficient documentation

## 2012-08-26 DIAGNOSIS — K219 Gastro-esophageal reflux disease without esophagitis: Secondary | ICD-10-CM | POA: Insufficient documentation

## 2012-08-26 DIAGNOSIS — R200 Anesthesia of skin: Secondary | ICD-10-CM

## 2012-08-26 DIAGNOSIS — IMO0001 Reserved for inherently not codable concepts without codable children: Secondary | ICD-10-CM | POA: Insufficient documentation

## 2012-08-26 DIAGNOSIS — Z8719 Personal history of other diseases of the digestive system: Secondary | ICD-10-CM | POA: Insufficient documentation

## 2012-08-26 DIAGNOSIS — Z8739 Personal history of other diseases of the musculoskeletal system and connective tissue: Secondary | ICD-10-CM | POA: Insufficient documentation

## 2012-08-26 DIAGNOSIS — Z8639 Personal history of other endocrine, nutritional and metabolic disease: Secondary | ICD-10-CM | POA: Insufficient documentation

## 2012-08-26 DIAGNOSIS — R202 Paresthesia of skin: Secondary | ICD-10-CM

## 2012-08-26 DIAGNOSIS — R110 Nausea: Secondary | ICD-10-CM | POA: Insufficient documentation

## 2012-08-26 DIAGNOSIS — Z87891 Personal history of nicotine dependence: Secondary | ICD-10-CM | POA: Insufficient documentation

## 2012-08-26 DIAGNOSIS — R209 Unspecified disturbances of skin sensation: Secondary | ICD-10-CM | POA: Insufficient documentation

## 2012-08-26 DIAGNOSIS — M79609 Pain in unspecified limb: Secondary | ICD-10-CM | POA: Insufficient documentation

## 2012-08-26 DIAGNOSIS — Z8679 Personal history of other diseases of the circulatory system: Secondary | ICD-10-CM | POA: Insufficient documentation

## 2012-08-26 LAB — CBC WITH DIFFERENTIAL/PLATELET
Basophils Absolute: 0 10*3/uL (ref 0.0–0.1)
Basophils Relative: 1 % (ref 0–1)
Eosinophils Relative: 7 % — ABNORMAL HIGH (ref 0–5)
HCT: 38.3 % (ref 36.0–46.0)
MCHC: 34.7 g/dL (ref 30.0–36.0)
Monocytes Absolute: 0.9 10*3/uL (ref 0.1–1.0)
Neutro Abs: 5 10*3/uL (ref 1.7–7.7)
Platelets: 252 10*3/uL (ref 150–400)
RDW: 12.3 % (ref 11.5–15.5)

## 2012-08-26 LAB — BASIC METABOLIC PANEL
BUN: 17 mg/dL (ref 6–23)
CO2: 27 mEq/L (ref 19–32)
Calcium: 9.4 mg/dL (ref 8.4–10.5)
Glucose, Bld: 166 mg/dL — ABNORMAL HIGH (ref 70–99)
Sodium: 137 mEq/L (ref 135–145)

## 2012-08-26 LAB — TROPONIN I: Troponin I: <0.3 ng/mL (ref <0.30)

## 2012-08-26 MED ORDER — ASPIRIN 81 MG PO CHEW
324.0000 mg | CHEWABLE_TABLET | Freq: Once | ORAL | Status: AC
  Administered 2012-08-26: 324 mg via ORAL
  Filled 2012-08-26: qty 4

## 2012-08-26 MED ORDER — SODIUM CHLORIDE 0.9 % IV SOLN
Freq: Once | INTRAVENOUS | Status: AC
  Administered 2012-08-26: 1000 mL via INTRAVENOUS

## 2012-08-26 MED ORDER — NAPROXEN 500 MG PO TABS: 500.0000 mg | ORAL_TABLET | Freq: Two times a day (BID) | ORAL | Status: DC

## 2012-08-26 NOTE — ED Notes (Signed)
Patient denies chest pain and states that numbness has now resolved.  Patient remains A&O; follows commands.

## 2012-08-26 NOTE — ED Notes (Signed)
Patient states she was walking around [employer] at 1730 today and her left arm began hurting.  Patient states lip became numb and her left hand/fingers became numb.  A&O; skin w/d. Respirations even and unlabored.  Grips and pedal pushes equal.

## 2012-08-26 NOTE — ED Provider Notes (Signed)
History  This chart was scribed for Vida Roller, MD by Bennett Scrape, ED Scribe. This patient was seen in room [facility]2/[facility] and the patient's care was started at 8:14 PM.  CSN: [id number]  Arrival date & time 08/26/12  1947   First MD Initiated Contact with Patient 08/26/12 2007      Chief Complaint  Patient presents with  . Arm Pain  . Numbness    The history is provided by the patient. No language interpreter was used.    HPI Comments: Taylor Leach is a 57 y.o. female who presents to the Emergency Department complaining of gradual onset, gradually worsening, constant pain in the middle of her left arm which began three hours ago while she was shopping. She describes the pain as "throbbing like a tooth ache." She reports that after the pain in her left arm began, she also experienced associated symptoms of numbness in her lips,  tingling in the 4th and 5th fingers of her left hand, nausea, and  minor discomfort in her left jaw. She denies that increased activity increases the pain her arm, causes her to feel SOB, or fatigues her. She also reports that for the past several weeks she has experienced blurred vision in her left eye, and that for months she has experienced neuropathy in her feet although yesterday the neuropathy in her large toe increased. She also denies headaches and leg swelling bilaterally. She states that a year ago she had a prior episode of chest pain which was attributed to acid reflux, and she had a subsequent stress test which did not indicate any cardiac disease.  The pt reports that her A1C is 6.5, and she does not take medication for diabetes due to its side effects. She states that the only medication she takes is motrin. She denies a h/o HTN. 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  She is a former smoker and drinks a glass of wine a week.   Past Medical History  Diagnosis Date  . Autoimmune hepatitis   . GERD (gastroesophageal  reflux disease)   . Arthritis   . Chest pain     Stress echo normal, October, 2012  . Dyslipidemia   . Ejection fraction     EF 55-60%, echo, October, 2012    Past Surgical History  Procedure Laterality Date  . Cholecystectomy    . Nasal sinus surgery    . Vaginal hysterectomy      Family History  Problem Relation Age of Onset  . Cancer Mother   . Cancer Father     History  Substance Use Topics  . Smoking status: Former Smoker -- 1.00 packs/day  . Smokeless tobacco: Not on file  . Alcohol Use: 0.6 oz/week    1 Glasses of wine per week    No OB history.   Review of Systems  Eyes: Positive for visual disturbance.  Gastrointestinal: Positive for nausea. Negative for vomiting.  Musculoskeletal: Positive for myalgias. Negative for back pain.  Neurological: Positive for numbness. Negative for headaches.  All other systems reviewed and are negative.    Allergies  Hydrocodone  Home Medications   Current Outpatient Rx  Name  Route  Sig  Dispense  Refill  . fluticasone (FLONASE) 50 MCG/ACT nasal spray   Nasal   Place 2 sprays into the nose daily as needed. Sinus Allergies          . ibuprofen (ADVIL,MOTRIN) 200 MG tablet   Oral  Take 200 mg by mouth every 6 (six) hours as needed.           . mercaptopurine (PURINETHOL) 50 MG tablet   Oral   Take 50 mg by mouth daily. Give on an empty stomach 1 hour before or 2 hours after meals. Caution: Chemotherapy.         . naproxen (NAPROSYN) 500 MG tablet   Oral   Take 1 tablet (500 mg total) by mouth 2 (two) times daily with a meal.   30 tablet   0   . omeprazole (PRILOSEC) 20 MG capsule   Oral   Take 2 capsules (40 mg total) by mouth 2 (two) times daily.           BP 144/58  Pulse 79  Temp(Src) 98.4 F (36.9 C) (Oral)  Resp 20  Ht 5\' 5"  (1.651 m)  Wt 220 lb (99.791 kg)  BMI 36.61 kg/m2  SpO2 98%  Physical Exam  Nursing note and vitals reviewed. Constitutional: She is oriented to person, place,  and time. She appears well-developed and well-nourished. No distress.  HENT:  Head: Normocephalic and atraumatic.  Mouth/Throat: Oropharynx is clear and moist.  Eyes: Conjunctivae and EOM are normal. Pupils are equal, round, and reactive to light.  Neck: Neck supple. No tracheal deviation present.  Cardiovascular: Normal rate, regular rhythm and normal heart sounds.   Pulmonary/Chest: Effort normal and breath sounds normal. No respiratory distress.  Musculoskeletal: Normal range of motion. She exhibits no edema.  Reproducible pain in the left arm with movement and palpation.   Neurological: She is alert and oriented to person, place, and time. She has normal reflexes. No cranial nerve deficit.  Sensation is intact. No motor or sensory deficits noted  Skin: Skin is warm and dry.  Psychiatric: She has a normal mood and affect. Her behavior is normal.    ED Course  Procedures (including critical care time)  DIAGNOSTIC STUDIES: Oxygen Saturation is 98% on room air, normal by my interpretation.    COORDINATION OF CARE: 8:20 PM Discussed normal EKG with pt and her lack of history of cardiac problems. However, advised pt that tests will be performed due to anomalous symptoms of arm pain, tingling lips, and loss of sensation in fingers. Pt agreed to plan.   Labs Reviewed  BASIC METABOLIC PANEL - Abnormal; Notable for the following:    Glucose, Bld 166 (*)    GFR calc non Af Amer 72 (*)    GFR calc Af Amer 84 (*)    All other components within normal limits  CBC WITH DIFFERENTIAL - Abnormal; Notable for the following:    Eosinophils Relative 7 (*)    All other components within normal limits  TROPONIN I   Dg Chest 2 View  08/26/2012   *RADIOLOGY REPORT*  Clinical Data: Left upper extremity pain.  Tick bite approximately 2 weeks ago.  CHEST - 2 VIEW  Comparison: Portable chest x-ray 10/23/2010.  Findings: Suboptimal inspiration due to body habitus which accounts for crowded bronchovascular  markings at the bases and accentuates the cardiac silhouette.  Taking this into account, cardiac silhouette normal in size, unchanged.  Thoracic aorta mildly tortuous, unchanged.  Hilar and mediastinal contours otherwise unremarkable.  Lungs clear.  Bronchovascular markings normal. Pulmonary vascularity normal.  No pneumothorax.  No pleural effusions.  Mild degenerative changes involving the thoracic spine.  IMPRESSION: Suboptimal inspiration.  No acute cardiopulmonary disease.   Original Report Authenticated By: Hulan Saas, M.D.  1. Upper extremity pain, left   2. Numbness and tingling       MDM  The patient's symptoms are consistent with more of a muscular or a neuropathic pain as right fourth and fifth digits of the left hand are involved which is old or nerve distribution. She has reproducible pain on my exam and has an EKG which is unremarkable and unchanged from prior EKGs from 2 years ago. She has a normal troponin, normal lab work and a chest x-ray which shows no acute findings. She is stable for discharge, Naprosyn for pain, precautions for return and given that she is expressed her understanding. She is low risk for acute coronary syndrome.  ED ECG REPORT  I personally interpreted this EKG   Date: 08/26/2012   Rate: 75  Rhythm: normal sinus rhythm  QRS Axis: normal  Intervals: normal  ST/T Wave abnormalities: normal  Conduction Disutrbances:none  Narrative Interpretation:   Old EKG Reviewed: unchanged  I personally performed the services described in this documentation, which was scribed in my presence. The recorded information has been reviewed and is accurate.    Meds given in ED:  Medications  0.9 %  sodium chloride infusion (1,000 mLs Intravenous New Bag/Given 08/26/12 2114)  aspirin chewable tablet 324 mg (324 mg Oral Given 08/26/12 2059)    New Prescriptions   NAPROXEN (NAPROSYN) 500 MG TABLET    Take 1 tablet (500 mg total) by mouth 2 (two) times daily with  a meal.       Vida Roller, MD 08/26/12 2134

## 2013-11-14 ENCOUNTER — Encounter: Payer: Self-pay | Admitting: Gastroenterology

## 2013-11-14 ENCOUNTER — Encounter: Payer: Self-pay | Admitting: Family Medicine

## 2014-09-06 LAB — HM DIABETES EYE EXAM

## 2015-07-13 DIAGNOSIS — I10 Essential (primary) hypertension: Secondary | ICD-10-CM | POA: Diagnosis not present

## 2015-07-13 DIAGNOSIS — K754 Autoimmune hepatitis: Secondary | ICD-10-CM | POA: Diagnosis not present

## 2015-07-13 DIAGNOSIS — R740 Nonspecific elevation of levels of transaminase and lactic acid dehydrogenase [LDH]: Secondary | ICD-10-CM | POA: Diagnosis not present

## 2015-07-13 DIAGNOSIS — Z6839 Body mass index (BMI) 39.0-39.9, adult: Secondary | ICD-10-CM | POA: Diagnosis not present

## 2015-07-13 DIAGNOSIS — E8881 Metabolic syndrome: Secondary | ICD-10-CM | POA: Diagnosis not present

## 2015-07-13 DIAGNOSIS — Z87891 Personal history of nicotine dependence: Secondary | ICD-10-CM | POA: Diagnosis not present

## 2015-07-13 DIAGNOSIS — K449 Diaphragmatic hernia without obstruction or gangrene: Secondary | ICD-10-CM | POA: Diagnosis not present

## 2015-07-13 DIAGNOSIS — K219 Gastro-esophageal reflux disease without esophagitis: Secondary | ICD-10-CM | POA: Diagnosis not present

## 2015-07-13 DIAGNOSIS — K760 Fatty (change of) liver, not elsewhere classified: Secondary | ICD-10-CM | POA: Diagnosis not present

## 2015-07-13 DIAGNOSIS — Z5181 Encounter for therapeutic drug level monitoring: Secondary | ICD-10-CM | POA: Diagnosis not present

## 2015-07-13 DIAGNOSIS — Z79899 Other long term (current) drug therapy: Secondary | ICD-10-CM | POA: Diagnosis not present

## 2015-07-13 DIAGNOSIS — E1140 Type 2 diabetes mellitus with diabetic neuropathy, unspecified: Secondary | ICD-10-CM | POA: Diagnosis not present

## 2015-07-13 DIAGNOSIS — R1011 Right upper quadrant pain: Secondary | ICD-10-CM | POA: Diagnosis not present

## 2015-07-13 DIAGNOSIS — E781 Pure hyperglyceridemia: Secondary | ICD-10-CM | POA: Diagnosis not present

## 2015-07-13 DIAGNOSIS — R5383 Other fatigue: Secondary | ICD-10-CM | POA: Diagnosis not present

## 2015-07-13 DIAGNOSIS — E669 Obesity, unspecified: Secondary | ICD-10-CM | POA: Diagnosis not present

## 2015-08-27 DIAGNOSIS — J40 Bronchitis, not specified as acute or chronic: Secondary | ICD-10-CM | POA: Diagnosis not present

## 2015-08-27 DIAGNOSIS — K219 Gastro-esophageal reflux disease without esophagitis: Secondary | ICD-10-CM | POA: Diagnosis not present

## 2015-08-28 ENCOUNTER — Encounter: Payer: Self-pay | Admitting: Gastroenterology

## 2015-10-23 ENCOUNTER — Telehealth: Payer: Self-pay | Admitting: *Deleted

## 2015-10-23 NOTE — Telephone Encounter (Signed)
Patient was on schedule with Dr Adela LankArmbruster. However, it was recently discovered that she is being seen at [facility] (Dr Piedad Climesarling). Last appointment 07/13/15. Patient will be taken off of Dr Lanetta InchArmbruster's schedule until all records have been received and reviewed by Dr Adela LankArmbruster. Once received, Dr Adela LankArmbruster will decide if he will be able to accept patient.

## 2015-10-25 DIAGNOSIS — N3000 Acute cystitis without hematuria: Secondary | ICD-10-CM | POA: Diagnosis not present

## 2015-10-25 DIAGNOSIS — R109 Unspecified abdominal pain: Secondary | ICD-10-CM | POA: Diagnosis not present

## 2015-10-28 DIAGNOSIS — M9903 Segmental and somatic dysfunction of lumbar region: Secondary | ICD-10-CM | POA: Diagnosis not present

## 2015-10-28 DIAGNOSIS — M5031 Other cervical disc degeneration,  high cervical region: Secondary | ICD-10-CM | POA: Diagnosis not present

## 2015-10-28 DIAGNOSIS — M9901 Segmental and somatic dysfunction of cervical region: Secondary | ICD-10-CM | POA: Diagnosis not present

## 2015-10-29 ENCOUNTER — Ambulatory Visit: Admitting: Gastroenterology

## 2015-11-04 DIAGNOSIS — K759 Inflammatory liver disease, unspecified: Secondary | ICD-10-CM | POA: Diagnosis not present

## 2015-11-04 DIAGNOSIS — Z6838 Body mass index (BMI) 38.0-38.9, adult: Secondary | ICD-10-CM | POA: Diagnosis not present

## 2015-11-04 DIAGNOSIS — I10 Essential (primary) hypertension: Secondary | ICD-10-CM | POA: Diagnosis not present

## 2015-11-04 DIAGNOSIS — K449 Diaphragmatic hernia without obstruction or gangrene: Secondary | ICD-10-CM | POA: Diagnosis not present

## 2015-11-04 DIAGNOSIS — M1990 Unspecified osteoarthritis, unspecified site: Secondary | ICD-10-CM | POA: Diagnosis not present

## 2015-11-04 DIAGNOSIS — K219 Gastro-esophageal reflux disease without esophagitis: Secondary | ICD-10-CM | POA: Diagnosis not present

## 2015-11-04 DIAGNOSIS — K222 Esophageal obstruction: Secondary | ICD-10-CM | POA: Diagnosis not present

## 2015-11-04 DIAGNOSIS — K2970 Gastritis, unspecified, without bleeding: Secondary | ICD-10-CM | POA: Diagnosis not present

## 2015-11-04 DIAGNOSIS — E039 Hypothyroidism, unspecified: Secondary | ICD-10-CM | POA: Diagnosis not present

## 2015-11-04 DIAGNOSIS — E119 Type 2 diabetes mellitus without complications: Secondary | ICD-10-CM | POA: Diagnosis not present

## 2015-11-04 DIAGNOSIS — Z79899 Other long term (current) drug therapy: Secondary | ICD-10-CM | POA: Diagnosis not present

## 2015-11-04 DIAGNOSIS — E669 Obesity, unspecified: Secondary | ICD-10-CM | POA: Diagnosis not present

## 2015-11-04 DIAGNOSIS — Z87891 Personal history of nicotine dependence: Secondary | ICD-10-CM | POA: Diagnosis not present

## 2016-01-11 DIAGNOSIS — Z87891 Personal history of nicotine dependence: Secondary | ICD-10-CM | POA: Diagnosis not present

## 2016-01-11 DIAGNOSIS — I10 Essential (primary) hypertension: Secondary | ICD-10-CM | POA: Diagnosis not present

## 2016-01-11 DIAGNOSIS — E669 Obesity, unspecified: Secondary | ICD-10-CM | POA: Diagnosis not present

## 2016-01-11 DIAGNOSIS — E1140 Type 2 diabetes mellitus with diabetic neuropathy, unspecified: Secondary | ICD-10-CM | POA: Diagnosis not present

## 2016-01-11 DIAGNOSIS — E781 Pure hyperglyceridemia: Secondary | ICD-10-CM | POA: Diagnosis not present

## 2016-01-11 DIAGNOSIS — E042 Nontoxic multinodular goiter: Secondary | ICD-10-CM | POA: Diagnosis not present

## 2016-01-11 DIAGNOSIS — K754 Autoimmune hepatitis: Secondary | ICD-10-CM | POA: Diagnosis not present

## 2016-01-11 DIAGNOSIS — Z7951 Long term (current) use of inhaled steroids: Secondary | ICD-10-CM | POA: Diagnosis not present

## 2016-01-11 DIAGNOSIS — M1990 Unspecified osteoarthritis, unspecified site: Secondary | ICD-10-CM | POA: Diagnosis not present

## 2016-01-11 DIAGNOSIS — R740 Nonspecific elevation of levels of transaminase and lactic acid dehydrogenase [LDH]: Secondary | ICD-10-CM | POA: Diagnosis not present

## 2016-01-11 DIAGNOSIS — R300 Dysuria: Secondary | ICD-10-CM | POA: Diagnosis not present

## 2016-01-11 DIAGNOSIS — Z6838 Body mass index (BMI) 38.0-38.9, adult: Secondary | ICD-10-CM | POA: Diagnosis not present

## 2016-01-11 DIAGNOSIS — T451X5A Adverse effect of antineoplastic and immunosuppressive drugs, initial encounter: Secondary | ICD-10-CM | POA: Diagnosis not present

## 2016-01-11 DIAGNOSIS — N390 Urinary tract infection, site not specified: Secondary | ICD-10-CM | POA: Diagnosis not present

## 2016-01-11 DIAGNOSIS — K760 Fatty (change of) liver, not elsewhere classified: Secondary | ICD-10-CM | POA: Diagnosis not present

## 2016-01-11 DIAGNOSIS — Z7984 Long term (current) use of oral hypoglycemic drugs: Secondary | ICD-10-CM | POA: Diagnosis not present

## 2016-01-11 DIAGNOSIS — Z87440 Personal history of urinary (tract) infections: Secondary | ICD-10-CM | POA: Diagnosis not present

## 2016-01-11 DIAGNOSIS — N343 Urethral syndrome, unspecified: Secondary | ICD-10-CM | POA: Diagnosis not present

## 2016-01-23 DIAGNOSIS — Z803 Family history of malignant neoplasm of breast: Secondary | ICD-10-CM | POA: Diagnosis not present

## 2016-01-23 DIAGNOSIS — Z1231 Encounter for screening mammogram for malignant neoplasm of breast: Secondary | ICD-10-CM | POA: Diagnosis not present

## 2016-05-18 ENCOUNTER — Encounter: Payer: Self-pay | Admitting: Physician Assistant

## 2016-05-18 ENCOUNTER — Ambulatory Visit (INDEPENDENT_AMBULATORY_CARE_PROVIDER_SITE_OTHER): Payer: BLUE CROSS/BLUE SHIELD | Admitting: Physician Assistant

## 2016-05-18 VITALS — BP 145/79 | HR 64 | Temp 97.3°F | Ht 65.0 in | Wt 227.8 lb

## 2016-05-18 DIAGNOSIS — E7800 Pure hypercholesterolemia, unspecified: Secondary | ICD-10-CM | POA: Insufficient documentation

## 2016-05-18 DIAGNOSIS — R7309 Other abnormal glucose: Secondary | ICD-10-CM | POA: Insufficient documentation

## 2016-05-18 DIAGNOSIS — K219 Gastro-esophageal reflux disease without esophagitis: Secondary | ICD-10-CM

## 2016-05-18 DIAGNOSIS — J301 Allergic rhinitis due to pollen: Secondary | ICD-10-CM | POA: Insufficient documentation

## 2016-05-18 DIAGNOSIS — Z23 Encounter for immunization: Secondary | ICD-10-CM | POA: Diagnosis not present

## 2016-05-18 DIAGNOSIS — K754 Autoimmune hepatitis: Secondary | ICD-10-CM

## 2016-05-18 DIAGNOSIS — Z0000 Encounter for general adult medical examination without abnormal findings: Secondary | ICD-10-CM

## 2016-05-18 LAB — BAYER DCA HB A1C WAIVED: HB A1C: 7 % — AB (ref <7.0)

## 2016-05-18 MED ORDER — FLUTICASONE PROPIONATE 50 MCG/ACT NA SUSP: 2.0000 | Freq: Every day | NASAL | 11 refills | Status: DC

## 2016-05-18 MED ORDER — PANTOPRAZOLE SODIUM 40 MG PO TBEC: 40.0000 mg | DELAYED_RELEASE_TABLET | Freq: Every day | ORAL | 11 refills | Status: DC

## 2016-05-18 NOTE — Progress Notes (Signed)
BP (!) 145/79   Pulse 64   Temp 97.3 F (36.3 C) (Oral)   Ht '5\' 5"'  (1.651 m)   Wt 227 lb 12.8 oz (103.3 kg)   BMI 37.91 kg/m    Subjective:    Patient ID: Taylor Leach, female    DOB: 1956/01/04, 61 y.o.   MRN: [id number]  Taylor Leach is a 61 y.o. female presenting on 05/18/2016 for Annual Exam  HPI Patient here to be established as new patient at [facility].  This patient is known to me from [facility]. This patient comes in for annual well physical examination. All medications are reviewed today. There are no reports of any problems with the medications. All of the medical conditions are reviewed and updated.  Lab work is reviewed and will be ordered as medically necessary. There are no new problems reported with today's visit.  Patient reports doing well overall.  She reports that in the past she has taken Crestor without complication. She did have trouble with fenofibrate raising her liver function tests. She has also taken metformin in the past without severe complication. We will consider these medications after her labs have returned today.  Past Medical History:  Diagnosis Date  . Arthritis   . Autoimmune hepatitis ([place])   . Chest pain    Stress echo normal, October, 2012  . Dyslipidemia   . Ejection fraction    EF 55-60%, echo, October, 2012  . GERD (gastroesophageal reflux disease)    Relevant past medical, surgical, family and social history reviewed and updated as indicated. Interim medical history since our last visit reviewed. Allergies and medications reviewed and updated.   Data reviewed from any sources in EPIC.  Review of Systems  Constitutional: Negative.  Negative for activity change, fatigue and fever.  HENT: Negative.   Eyes: Negative.   Respiratory: Negative.  Negative for cough.   Cardiovascular: Negative.  Negative for chest pain.  Gastrointestinal: Positive for diarrhea and nausea. Negative for abdominal distention  and abdominal pain.  Endocrine: Negative.   Genitourinary: Negative.  Negative for dysuria.  Musculoskeletal: Positive for arthralgias.  Skin: Negative.   Neurological: Negative.      Social History   Social History  . Marital status: Married    Spouse name: N/A  . Number of children: N/A  . Years of education: N/A   Occupational History  . Not on file.   Social History Main Topics  . Smoking status: Former Smoker    Packs/day: 1.00  . Smokeless tobacco: Never Used  . Alcohol use 0.6 oz/week    1 Glasses of wine per week  . Drug use: No  . Sexual activity: Yes    Birth control/ protection: Surgical   Other Topics Concern  . Not on file   Social History Narrative  . No narrative on file    Past Surgical History:  Procedure Laterality Date  . CHOLECYSTECTOMY    . NASAL SINUS SURGERY    . VAGINAL HYSTERECTOMY      Family History  Problem Relation Age of Onset  . Cancer Mother   . Cancer Father     Allergies as of 05/18/2016      Reactions   Fenofibrate Micronized Other (See Comments)   Caused increased LFT's Caused increased LFT's   Hydrocodone Nausea And Vomiting, Other (See Comments)   Makes body hot.    Hydrocodone-acetaminophen Nausea And Vomiting      Medication List  Accurate as of 05/18/16 11:17 AM. Always use your most recent med list.          fluticasone 50 MCG/ACT nasal spray Commonly known as:  FLONASE Place 2 sprays into both nostrils daily. Sinus Allergies   ibuprofen 200 MG tablet Commonly known as:  ADVIL,MOTRIN Take 200 mg by mouth every 6 (six) hours as needed.   mercaptopurine 50 MG tablet Commonly known as:  PURINETHOL Take 50 mg by mouth daily. Give on an empty stomach 1 hour before or 2 hours after meals. Caution: Chemotherapy.   pantoprazole 40 MG tablet Commonly known as:  PROTONIX Take 1 tablet (40 mg total) by mouth daily.          Objective:    BP (!) 145/79   Pulse 64   Temp 97.3 F (36.3 C)  (Oral)   Ht '5\' 5"'  (1.651 m)   Wt 227 lb 12.8 oz (103.3 kg)   BMI 37.91 kg/m   Allergies  Allergen Reactions  . Fenofibrate Micronized Other (See Comments)    Caused increased LFT's Caused increased LFT's  . Hydrocodone Nausea And Vomiting and Other (See Comments)    Makes body hot.   . Hydrocodone-Acetaminophen Nausea And Vomiting   Wt Readings from Last 3 Encounters:  05/18/16 227 lb 12.8 oz (103.3 kg)  08/26/12 220 lb (99.8 kg)  01/07/11 221 lb (100.2 kg)    Physical Exam  Constitutional: She is oriented to person, place, and time. She appears well-developed and well-nourished.  HENT:  Head: Normocephalic and atraumatic.  Right Ear: Tympanic membrane, external ear and ear canal normal.  Left Ear: Tympanic membrane, external ear and ear canal normal.  Nose: Nose normal. No rhinorrhea.  Mouth/Throat: Oropharynx is clear and moist and mucous membranes are normal. No oropharyngeal exudate or posterior oropharyngeal erythema.  Eyes: Conjunctivae and EOM are normal. Pupils are equal, round, and reactive to light.  Neck: Normal range of motion. Neck supple.  Cardiovascular: Normal rate, regular rhythm, normal heart sounds and intact distal pulses.   Pulmonary/Chest: Effort normal and breath sounds normal.  Abdominal: Soft. Bowel sounds are normal.  Neurological: She is alert and oriented to person, place, and time. She has normal reflexes.  Skin: Skin is warm and dry. No rash noted.  Psychiatric: She has a normal mood and affect. Her behavior is normal. Judgment and thought content normal.        Assessment & Plan:   1. Well adult exam - CBC with Differential/Platelet - CMP14+EGFR - Lipid panel - TSH - Bayer DCA Hb A1c Waived  2. Autoimmune hepatitis ([place])  3. Elevated glucose - Bayer DCA Hb A1c Waived  4. Gastroesophageal reflux disease without esophagitis - pantoprazole (PROTONIX) 40 MG tablet; Take 1 tablet (40 mg total) by mouth daily.  Dispense: 30 tablet;  Refill: 11  5. Pure hypercholesterolemia  6. Chronic allergic rhinitis due to pollen, unspecified seasonality - fluticasone (FLONASE) 50 MCG/ACT nasal spray; Place 2 sprays into both nostrils daily. Sinus Allergies  Dispense: 16 g; Refill: 11   Continue all other maintenance medications as listed above. Educational handout given for carb counting  Follow up plan: Return in about 3 months (around 08/15/2016) for recheck.  Terald Sleeper PA-C [facility] [address]  [place], [place] [address] [phone]   05/18/2016, 11:17 AM

## 2016-05-18 NOTE — Patient Instructions (Signed)
Carbohydrate Counting for Diabetes Mellitus, Adult Carbohydrate counting is a method for keeping track of how many carbohydrates you eat. Eating carbohydrates naturally increases the amount of sugar (glucose) in the blood. Counting how many carbohydrates you eat helps keep your blood glucose within normal limits, which helps you manage your diabetes (diabetes mellitus). It is important to know how many carbohydrates you can safely have in each meal. This is different for every person. A diet and nutrition specialist (registered dietitian) can help you make a meal plan and calculate how many carbohydrates you should have at each meal and snack. Carbohydrates are found in the following foods:  Grains, such as breads and cereals.  Dried beans and soy products.  Starchy vegetables, such as potatoes, peas, and corn.  Fruit and fruit juices.  Milk and yogurt.  Sweets and snack foods, such as cake, cookies, candy, chips, and soft drinks. How do I count carbohydrates? There are two ways to count carbohydrates in food. You can use either of the methods or a combination of both. Reading "Nutrition Facts" on packaged food  The "Nutrition Facts" list is included on the labels of almost all packaged foods and beverages in the [place] It includes:  The serving size.  Information about nutrients in each serving, including the grams (g) of carbohydrate per serving. To use the "Nutrition Facts":  Decide how many servings you will have.  Multiply the number of servings by the number of carbohydrates per serving.  The resulting number is the total amount of carbohydrates that you will be having. Learning standard serving sizes of other foods  When you eat foods containing carbohydrates that are not packaged or do not include "Nutrition Facts" on the label, you need to measure the servings in order to count the amount of carbohydrates:  Measure the foods that you will eat with a food scale or measuring  cup, if needed.  Decide how many standard-size servings you will eat.  Multiply the number of servings by 15. Most carbohydrate-rich foods have about 15 g of carbohydrates per serving.  For example, if you eat 8 oz (170 g) of strawberries, you will have eaten 2 servings and 30 g of carbohydrates (2 servings x 15 g = 30 g).  For foods that have more than one food mixed, such as soups and casseroles, you must count the carbohydrates in each food that is included. The following list contains standard serving sizes of common carbohydrate-rich foods. Each of these servings has about 15 g of carbohydrates:   hamburger bun or  English muffin.   oz (15 mL) syrup.   oz (14 g) jelly.  1 slice of bread.  1 six-inch tortilla.  3 oz (85 g) cooked rice or pasta.  4 oz (113 g) cooked dried beans.  4 oz (113 g) starchy vegetable, such as peas, corn, or potatoes.  4 oz (113 g) hot cereal.  4 oz (113 g) mashed potatoes or  of a large baked potato.  4 oz (113 g) canned or frozen fruit.  4 oz (120 mL) fruit juice.  4-6 crackers.  6 chicken nuggets.  6 oz (170 g) unsweetened dry cereal.  6 oz (170 g) plain fat-free yogurt or yogurt sweetened with artificial sweeteners.  8 oz (240 mL) milk.  8 oz (170 g) fresh fruit or one small piece of fruit.  24 oz (680 g) popped popcorn. Example of carbohydrate counting Sample meal  3 oz (85 g) chicken breast.  6 oz (  170 g) brown rice.  4 oz (113 g) corn.  8 oz (240 mL) milk.  8 oz (170 g) strawberries with sugar-free whipped topping. Carbohydrate calculation 1. Identify the foods that contain carbohydrates:  Rice.  Corn.  Milk.  Strawberries. 2. Calculate how many servings you have of each food:  2 servings rice.  1 serving corn.  1 serving milk.  1 serving strawberries. 3. Multiply each number of servings by 15 g:  2 servings rice x 15 g = 30 g.  1 serving corn x 15 g = 15 g.  1 serving milk x 15 g = 15  g.  1 serving strawberries x 15 g = 15 g. 4. Add together all of the amounts to find the total grams of carbohydrates eaten:  30 g + 15 g + 15 g + 15 g = 75 g of carbohydrates total. This information is not intended to replace advice given to you by your health care provider. Make sure you discuss any questions you have with your health care provider. Document Released: 03/21/2005 Document Revised: 10/09/2015 Document Reviewed: 09/02/2015 Elsevier Interactive Patient Education  2017 [employer]  

## 2016-05-19 LAB — CBC WITH DIFFERENTIAL/PLATELET
BASOS: 0 %
Basophils Absolute: 0 10*3/uL (ref 0.0–0.2)
EOS (ABSOLUTE): 0.3 10*3/uL (ref 0.0–0.4)
Eos: 5 %
HEMOGLOBIN: 14.4 g/dL (ref 11.1–15.9)
Hematocrit: 41.6 % (ref 34.0–46.6)
Immature Grans (Abs): 0 10*3/uL (ref 0.0–0.1)
Immature Granulocytes: 0 %
Lymphocytes Absolute: 1.4 10*3/uL (ref 0.7–3.1)
Lymphs: 25 %
MCH: 34.2 pg — AB (ref 26.6–33.0)
MCHC: 34.6 g/dL (ref 31.5–35.7)
MCV: 99 fL — AB (ref 79–97)
MONOCYTES: 12 %
Monocytes Absolute: 0.7 10*3/uL (ref 0.1–0.9)
NEUTROS ABS: 3.2 10*3/uL (ref 1.4–7.0)
Neutrophils: 58 %
Platelets: 256 10*3/uL (ref 150–379)
RBC: 4.21 x10E6/uL (ref 3.77–5.28)
RDW: 13.9 % (ref 12.3–15.4)
WBC: 5.6 10*3/uL (ref 3.4–10.8)

## 2016-05-19 LAB — LIPID PANEL
CHOL/HDL RATIO: 5.5 ratio — AB (ref 0.0–4.4)
Cholesterol, Total: 231 mg/dL — ABNORMAL HIGH (ref 100–199)
HDL: 42 mg/dL (ref >39)
LDL CALC: 132 mg/dL — AB (ref 0–99)
Triglycerides: 283 mg/dL — ABNORMAL HIGH (ref 0–149)
VLDL CHOLESTEROL CAL: 57 mg/dL — AB (ref 5–40)

## 2016-05-19 LAB — CMP14+EGFR
A/G RATIO: 1.3 (ref 1.2–2.2)
ALBUMIN: 4.5 g/dL (ref 3.6–4.8)
ALT: 111 IU/L — ABNORMAL HIGH (ref 0–32)
AST: 60 IU/L — ABNORMAL HIGH (ref 0–40)
Alkaline Phosphatase: 76 IU/L (ref 39–117)
BILIRUBIN TOTAL: 0.5 mg/dL (ref 0.0–1.2)
BUN / CREAT RATIO: 14 (ref 12–28)
BUN: 12 mg/dL (ref 8–27)
CHLORIDE: 99 mmol/L (ref 96–106)
CO2: 18 mmol/L (ref 18–29)
Calcium: 9.4 mg/dL (ref 8.7–10.3)
Creatinine, Ser: 0.84 mg/dL (ref 0.57–1.00)
GFR calc non Af Amer: 76 mL/min/{1.73_m2} (ref >59)
GFR, EST AFRICAN AMERICAN: 87 mL/min/{1.73_m2} (ref >59)
Globulin, Total: 3.5 g/dL (ref 1.5–4.5)
Glucose: 102 mg/dL — ABNORMAL HIGH (ref 65–99)
Potassium: 4.3 mmol/L (ref 3.5–5.2)
Sodium: 142 mmol/L (ref 134–144)
TOTAL PROTEIN: 8 g/dL (ref 6.0–8.5)

## 2016-05-19 LAB — TSH: TSH: 2.51 u[IU]/mL (ref 0.450–4.500)

## 2016-07-06 DIAGNOSIS — K760 Fatty (change of) liver, not elsewhere classified: Secondary | ICD-10-CM | POA: Diagnosis not present

## 2016-07-06 DIAGNOSIS — K754 Autoimmune hepatitis: Secondary | ICD-10-CM | POA: Diagnosis not present

## 2016-07-06 DIAGNOSIS — Z6837 Body mass index (BMI) 37.0-37.9, adult: Secondary | ICD-10-CM | POA: Diagnosis not present

## 2016-07-27 ENCOUNTER — Encounter: Payer: Self-pay | Admitting: Physician Assistant

## 2016-07-27 ENCOUNTER — Ambulatory Visit (INDEPENDENT_AMBULATORY_CARE_PROVIDER_SITE_OTHER): Payer: BLUE CROSS/BLUE SHIELD | Admitting: Physician Assistant

## 2016-07-27 VITALS — BP 124/78 | HR 88 | Temp 97.0°F | Ht 65.0 in | Wt 226.8 lb

## 2016-07-27 DIAGNOSIS — E041 Nontoxic single thyroid nodule: Secondary | ICD-10-CM | POA: Diagnosis not present

## 2016-07-27 DIAGNOSIS — R222 Localized swelling, mass and lump, trunk: Secondary | ICD-10-CM | POA: Diagnosis not present

## 2016-07-27 DIAGNOSIS — E119 Type 2 diabetes mellitus without complications: Secondary | ICD-10-CM

## 2016-07-27 DIAGNOSIS — E1169 Type 2 diabetes mellitus with other specified complication: Secondary | ICD-10-CM

## 2016-07-27 DIAGNOSIS — E785 Hyperlipidemia, unspecified: Secondary | ICD-10-CM

## 2016-07-27 MED ORDER — EXENATIDE ER 2 MG ~~LOC~~ SRER: 2.0000 mg | SUBCUTANEOUS | 6 refills | Status: DC

## 2016-07-27 MED ORDER — GABAPENTIN 100 MG PO CAPS: 300.0000 mg | ORAL_CAPSULE | Freq: Every day | ORAL | 3 refills | Status: DC

## 2016-07-27 MED ORDER — AMOXICILLIN 500 MG PO CAPS: 1000.0000 mg | ORAL_CAPSULE | Freq: Two times a day (BID) | ORAL | 0 refills | Status: DC

## 2016-07-28 DIAGNOSIS — R222 Localized swelling, mass and lump, trunk: Secondary | ICD-10-CM | POA: Insufficient documentation

## 2016-07-28 DIAGNOSIS — E119 Type 2 diabetes mellitus without complications: Secondary | ICD-10-CM | POA: Insufficient documentation

## 2016-07-28 DIAGNOSIS — E041 Nontoxic single thyroid nodule: Secondary | ICD-10-CM | POA: Insufficient documentation

## 2016-07-28 HISTORY — DX: Type 2 diabetes mellitus without complications: E11.9

## 2016-07-28 NOTE — Progress Notes (Signed)
BP 124/78   Pulse 88   Temp 97 F (36.1 C) (Oral)   Ht  (1.651 m)   Wt 226 lb 12.8 oz (102.9 kg)   BMI 37.74 kg/m    Subjective:    Patient ID: Taylor Leach, female    DOB: Apr 29, 1955, 61 y.o.   MRN: [id number]  HPI: Taylor Leach is a 61 y.o. female presenting on 07/27/2016 for Sinusitis and Discuss neuropathy (The pain wakes her up )  This patient comes in for periodic recheck on medications and conditions including worsening diabetes, need for medicine change, neuropathy, fatty liver, high  Cholesterol, family history of Castleman's disease.  This patient has had many days of sinus headache and postnasal drainage. There is copious drainage at times. Denies any fever at this time. There has been a history of sinus infections in the past.  No history of sinus surgery. There is cough at night. It has become more prevalent in recent days.  All medications are reviewed today. There are no reports of any problems with the medications. All of the medical conditions are reviewed and updated.  Lab work is reviewed and will be ordered as medically necessary. There are no new problems reported with today's visit.   Relevant past medical, surgical, family and social history reviewed and updated as indicated. Allergies and medications reviewed and updated.  Past Medical History:  Diagnosis Date  . Arthritis   . Autoimmune hepatitis (HCC)   . Chest pain    Stress echo normal, October, 2012  . Dyslipidemia   . Ejection fraction    EF 55-60%, echo, October, 2012  . GERD (gastroesophageal reflux disease)     Past Surgical History:  Procedure Laterality Date  . CHOLECYSTECTOMY    . NASAL SINUS SURGERY    . VAGINAL HYSTERECTOMY      Review of Systems  Constitutional: Positive for fever. Negative for activity change, appetite change and fatigue.  HENT: Positive for congestion, postnasal drip, sinus pain, sinus pressure and sore throat.   Eyes: Negative.   Respiratory: Negative.   Negative for cough and shortness of breath.   Cardiovascular: Negative.  Negative for chest pain, palpitations and leg swelling.  Gastrointestinal: Negative.  Negative for abdominal pain.  Endocrine: Negative.   Genitourinary: Negative.  Negative for dysuria.  Musculoskeletal: Negative.   Skin: Negative.   Neurological: Positive for numbness and headaches. Negative for tremors and weakness.    Allergies as of 07/27/2016      Reactions   Fenofibrate Micronized Other (See Comments)   Caused increased LFT's Caused increased LFT's   Hydrocodone Nausea And Vomiting, Other (See Comments)   Makes body hot.    Hydrocodone-acetaminophen Nausea And Vomiting      Medication List       Accurate as of 07/27/16 11:59 PM. Always use your most recent med list.          amoxicillin 500 MG capsule Commonly known as:  AMOXIL Take 2 capsules (1,000 mg total) by mouth 2 (two) times daily.   atorvastatin 10 MG tablet Commonly known as:  LIPITOR   Exenatide ER 2 MG Srer Commonly known as:  BYDUREON Inject 2 mg into the skin once a week.   fluticasone 50 MCG/ACT nasal spray Commonly known as:  FLONASE Place 2 sprays into both nostrils daily. Sinus Allergies   gabapentin 100 MG capsule Commonly known as:  NEURONTIN Take 3 capsules (300 mg total) by mouth at bedtime.   ibuprofen  200 MG tablet Commonly known as:  ADVIL,MOTRIN Take 200 mg by mouth every 6 (six) hours as needed.   mercaptopurine 50 MG tablet Commonly known as:  PURINETHOL Take 50 mg by mouth daily. Give on an empty stomach 1 hour before or 2 hours after meals. Caution: Chemotherapy.   pantoprazole 40 MG tablet Commonly known as:  PROTONIX Take 1 tablet (40 mg total) by mouth daily.          Objective:    BP 124/78   Pulse 88   Temp 97 F (36.1 C) (Oral)   Ht  (1.651 m)   Wt 226 lb 12.8 oz (102.9 kg)   BMI 37.74 kg/m   Allergies  Allergen Reactions  . Fenofibrate Micronized Other (See Comments)     Caused increased LFT's Caused increased LFT's  . Hydrocodone Nausea And Vomiting and Other (See Comments)    Makes body hot.   . Hydrocodone-Acetaminophen Nausea And Vomiting    Physical Exam  Constitutional: She is oriented to person, place, and time. She appears well-developed and well-nourished.  HENT:  Head: Normocephalic and atraumatic.  Right Ear: Tympanic membrane, external ear and ear canal normal. No middle ear effusion.  Left Ear: Tympanic membrane, external ear and ear canal normal.  No middle ear effusion.  Nose: Nose normal. No rhinorrhea. Right sinus exhibits no maxillary sinus tenderness. Left sinus exhibits no maxillary sinus tenderness.  Mouth/Throat: Uvula is midline, oropharynx is clear and moist and mucous membranes are normal. No oropharyngeal exudate or posterior oropharyngeal erythema.  Eyes: Conjunctivae and EOM are normal. Pupils are equal, round, and reactive to light. Right eye exhibits no discharge. Left eye exhibits no discharge.  Neck: Normal range of motion. Neck supple.  Cardiovascular: Normal rate, regular rhythm, normal heart sounds and intact distal pulses.   Pulmonary/Chest: Effort normal and breath sounds normal. No respiratory distress. She has no wheezes.  Abdominal: Soft. Bowel sounds are normal.  Lymphadenopathy:    She has no cervical adenopathy.  Neurological: She is alert and oriented to person, place, and time. She has normal reflexes.  Skin: Skin is warm and dry. No rash noted.  Psychiatric: She has a normal mood and affect. Her behavior is normal. Judgment and thought content normal.    Results for orders placed or performed in visit on 05/26/16  HM DIABETES EYE EXAM  Result Value Ref Range   HM Diabetic Eye Exam No Retinopathy No Retinopathy      Assessment & Plan:   1. Hyperlipidemia associated with type 2 diabetes mellitus (HCC) - atorvastatin (LIPITOR) 10 MG tablet; ; Refill: 10 - Exenatide ER (BYDUREON) 2 MG SRER; Inject 2 mg  into the skin once a week.  Dispense: 1 each; Refill: 6  2. Diabetes mellitus without complication (HCC) - Exenatide ER (BYDUREON) 2 MG SRER; Inject 2 mg into the skin once a week.  Dispense: 1 each; Refill: 6  3. Chest mass - CT Chest Wo Contrast; Future  4. Thyroid nodule - US Thyroid Biopsy; Future   Current Outpatient Prescriptions:  .  atorvastatin (LIPITOR) 10 MG tablet, , Disp: , Rfl: 10 .  fluticasone (FLONASE) 50 MCG/ACT nasal spray, Place 2 sprays into both nostrils daily. Sinus Allergies, Disp: 16 g, Rfl: 11 .  ibuprofen (ADVIL,MOTRIN) 200 MG tablet, Take 200 mg by mouth every 6 (six) hours as needed.  , Disp: , Rfl:  .  mercaptopurine (PURINETHOL) 50 MG tablet, Take 50 mg by mouth daily. Give on an  empty stomach 1 hour before or 2 hours after meals. Caution: Chemotherapy., Disp: , Rfl:  .  pantoprazole (PROTONIX) 40 MG tablet, Take 1 tablet (40 mg total) by mouth daily., Disp: 30 tablet, Rfl: 11 .  amoxicillin (AMOXIL) 500 MG capsule, Take 2 capsules (1,000 mg total) by mouth 2 (two) times daily., Disp: 40 capsule, Rfl: 0 .  Exenatide ER (BYDUREON) 2 MG SRER, Inject 2 mg into the skin once a week., Disp: 1 each, Rfl: 6 .  gabapentin (NEURONTIN) 100 MG capsule, Take 3 capsules (300 mg total) by mouth at bedtime., Disp: 90 capsule, Rfl: 3  Continue all other maintenance medications as listed above.  Follow up plan: Follow-up as needed or worsening of symptoms. Call office for any issues.  Educational handout given for autoimmune hepatitis  Remus Loffler PA-C [facility] [address]  [place], [place] [address] [phone]   07/28/2016, 8:37 PM

## 2016-07-28 NOTE — Patient Instructions (Signed)
Autoimmune Hepatitis Autoimmune hepatitis is a disease in which body's disease-fighting system (immune system) attacks the liver and causes it to become inflamed. Without treatment, this condition gets worse. It can last for years and lead to scarring of the liver (cirrhosis) and liver failure. It also increases a person's risk for liver cancer. What are the causes? The cause of this condition is not known. Genetic factors, the environment, and previous infections may play a role. What increases the risk? This condition is more likely to develop in:  Females.  People who are 27-68 years of age. What are the signs or symptoms? Symptoms of this condition include:  Fatigue (common).  A yellowish color to the skin, the whites of the eyes, and mucous membranes (jaundice).  Itching.  Rash.  Joint pain.  Discomfort in the abdomen.  Nausea.  Loss of appetite. People who have advanced disease are more likely to have symptoms such as:  Fluid in the abdomen (ascites).  Confusion.  A stop in menstrual periods in women. How is this diagnosed? This condition is diagnosed with:  Blood tests.  A liver biopsy. In this test, a sample of liver tissue is removed from the body with a needle and examined under a microscope. How is this treated? This condition is treated with medicines that slow down the immune system. These medicines may need to be taken for life. In severe cases, this condition may be treated with a liver transplant. Usually, the earlier a person gets treatment for this condition, the better the results will be. Most people stop having symptoms with treatment. Follow these instructions at home:  Do not drink alcohol. It may cause further damage to your liver.  Take over-the-counter and prescription medicines only as told by your health care provider.  Keep all follow-up visits as told by your health care provider. This is important. Contact a health care provider  if:  You have a fever.  You have new symptoms.  Your symptoms get worse.  You have increasing fatigue or weakness.  You develop jaundice, or your jaundice gets worse. Get help right away if:  You are unable to eat or drink.  You feel nauseous or you vomit.  You feel confused.  You develop a rash.  Your skin, throat, mouth, or face becomes swollen.  You have a seizure.  You become very sleepy or you have trouble waking up.  You bruise or bleed easily.  You develop pain in your abdomen.  You feel dizzy.  You have difficulty walking. This information is not intended to replace advice given to you by your health care provider. Make sure you discuss any questions you have with your health care provider. Document Released: 06/11/2003 Document Revised: 12/08/2015 Document Reviewed: 07/14/2014 Elsevier Interactive Patient Education  2017 [employer].

## 2016-07-29 ENCOUNTER — Telehealth: Payer: Self-pay | Admitting: *Deleted

## 2016-07-29 MED ORDER — OXYCODONE HCL 5 MG PO CAPS: 2.5000 mg | ORAL_CAPSULE | Freq: Four times a day (QID) | ORAL | 0 refills | Status: DC | PRN

## 2016-07-29 MED ORDER — OXYCODONE HCL 5 MG PO TABS: 5.0000 mg | ORAL_TABLET | Freq: Four times a day (QID) | ORAL | 0 refills | Status: DC | PRN

## 2016-07-29 MED ORDER — ACETAMINOPHEN-CODEINE 300-30 MG PO TABS: 1.0000 | ORAL_TABLET | Freq: Four times a day (QID) | ORAL | 0 refills | Status: DC | PRN

## 2016-07-29 NOTE — Telephone Encounter (Signed)
Patient aware that rx is ready to be picked up.  

## 2016-07-29 NOTE — Telephone Encounter (Signed)
Patient states that her sinuses are really unbearable and wants to know if we can send in something for pain. Please advise

## 2016-07-29 NOTE — Telephone Encounter (Signed)
Patient states that she is unable to take tylenol due to her liver and codeine makes her sick. Please advise

## 2016-07-29 NOTE — Addendum Note (Signed)
Addended by: Tamera Punt on: 07/29/2016 12:07 PM   Modules accepted: Orders

## 2016-07-29 NOTE — Telephone Encounter (Signed)
Patient aware.

## 2016-08-04 ENCOUNTER — Other Ambulatory Visit: Payer: Self-pay | Admitting: Physician Assistant

## 2016-08-10 ENCOUNTER — Ambulatory Visit
Admission: RE | Admit: 2016-08-10 | Discharge: 2016-08-10 | Disposition: A | Discharge status: Home / Self Care | Payer: BLUE CROSS/BLUE SHIELD | Source: Ambulatory Visit | Attending: Physician Assistant | Admitting: Physician Assistant

## 2016-08-10 DIAGNOSIS — R222 Localized swelling, mass and lump, trunk: Secondary | ICD-10-CM | POA: Diagnosis not present

## 2016-08-10 DIAGNOSIS — E041 Nontoxic single thyroid nodule: Secondary | ICD-10-CM

## 2016-08-13 LAB — HM DIABETES EYE EXAM

## 2016-08-18 DIAGNOSIS — H52223 Regular astigmatism, bilateral: Secondary | ICD-10-CM | POA: Diagnosis not present

## 2016-08-18 DIAGNOSIS — H524 Presbyopia: Secondary | ICD-10-CM | POA: Diagnosis not present

## 2016-08-18 DIAGNOSIS — H5213 Myopia, bilateral: Secondary | ICD-10-CM | POA: Diagnosis not present

## 2016-08-22 ENCOUNTER — Ambulatory Visit (INDEPENDENT_AMBULATORY_CARE_PROVIDER_SITE_OTHER): Payer: BLUE CROSS/BLUE SHIELD | Admitting: Pediatrics

## 2016-08-22 ENCOUNTER — Encounter: Payer: Self-pay | Admitting: Pediatrics

## 2016-08-22 VITALS — BP 137/77 | HR 73 | Temp 98.0°F | Ht 65.0 in | Wt 225.0 lb

## 2016-08-22 DIAGNOSIS — J069 Acute upper respiratory infection, unspecified: Secondary | ICD-10-CM | POA: Diagnosis not present

## 2016-08-22 DIAGNOSIS — J40 Bronchitis, not specified as acute or chronic: Secondary | ICD-10-CM | POA: Diagnosis not present

## 2016-08-22 MED ORDER — AZITHROMYCIN 250 MG PO TABS
ORAL_TABLET | ORAL | 0 refills | Status: DC
Start: 2016-08-22 — End: 2016-11-07

## 2016-08-22 NOTE — Patient Instructions (Addendum)
Netipot with distilled water 2-3 times a day to clear out sinuses Or Normal saline nasal spray Flonase steroid nasal spray Antihistamine daily such as cetirizine Lots of fluids  

## 2016-08-22 NOTE — Progress Notes (Signed)
  Subjective:   Patient ID: Taylor Leach, female    DOB: [birth year], 61 y.o.   MRN: [id number] CC: Cough (low gd fever) and Nasal Congestion  HPI: Taylor Leach is a 61 y.o. female presenting for Cough (low gd fever) and Nasal Congestion  Started about a week ago with sore throat Taking sudafed Two days felt worse Feels about the same yesterday and today Worse today than end of last week  Relevant past medical, surgical, family and social history reviewed. Allergies and medications reviewed and updated. History  Smoking Status  . Former Smoker  . Packs/day: 1.00  Smokeless Tobacco  . Never Used   ROS: Per HPI   Objective:    BP 137/77 (BP Location: Left Arm)   Pulse 73   Temp 98 F (36.7 C) (Oral)   Ht 5\' 5"  (1.651 m)   Wt 225 lb (102.1 kg)   BMI 37.44 kg/m   Wt Readings from Last 3 Encounters:  08/22/16 225 lb (102.1 kg)  07/27/16 226 lb 12.8 oz (102.9 kg)  05/18/16 227 lb 12.8 oz (103.3 kg)    Gen: NAD, alert, cooperative with exam, NCAT, dry cough present EYES: EOMI, no conjunctival injection, or no icterus ENT:  TMs pearly gray b/l, OP without erythema LYMPH: no cervical LAD CV: NRRR, normal S1/S2, no murmur, distal pulses 2+ b/l Resp: CTABL, no wheezes, normal WOB Ext: No edema, warm Neuro: Alert and oriented MSK: normal muscle bulk  Assessment & Plan:  Taylor Leach was seen today for cough and nasal congestion.  Diagnoses and all orders for this visit:  Bronchitis Ongoing cough for a week, worse today Treat with below -     azithromycin (ZITHROMAX) 250 MG tablet; Take 2 the first day and then one each day after.  Acute URI Discussed symptomatic care, sinus rinses, antihistamine use  Follow up plan: Return if symptoms worsen or fail to improve. Rex Krasarol Vincent, MD [facility]

## 2016-11-07 ENCOUNTER — Encounter: Payer: Self-pay | Admitting: Physician Assistant

## 2016-11-07 ENCOUNTER — Ambulatory Visit (INDEPENDENT_AMBULATORY_CARE_PROVIDER_SITE_OTHER): Payer: BLUE CROSS/BLUE SHIELD | Admitting: Physician Assistant

## 2016-11-07 VITALS — BP 120/66 | HR 64 | Temp 98.0°F | Ht 65.0 in | Wt 214.0 lb

## 2016-11-07 DIAGNOSIS — E785 Hyperlipidemia, unspecified: Secondary | ICD-10-CM

## 2016-11-07 DIAGNOSIS — E1169 Type 2 diabetes mellitus with other specified complication: Secondary | ICD-10-CM

## 2016-11-07 DIAGNOSIS — I10 Essential (primary) hypertension: Secondary | ICD-10-CM

## 2016-11-07 DIAGNOSIS — K754 Autoimmune hepatitis: Secondary | ICD-10-CM | POA: Diagnosis not present

## 2016-11-07 DIAGNOSIS — K219 Gastro-esophageal reflux disease without esophagitis: Secondary | ICD-10-CM | POA: Diagnosis not present

## 2016-11-07 LAB — BAYER DCA HB A1C WAIVED: HB A1C (BAYER DCA - WAIVED): 6.1 % (ref <7.0)

## 2016-11-07 MED ORDER — METFORMIN HCL 500 MG PO TABS: 500.0000 mg | ORAL_TABLET | Freq: Two times a day (BID) | ORAL | 3 refills | Status: DC

## 2016-11-07 NOTE — Patient Instructions (Signed)
In a few days you may receive a survey in the mail or online from [employer] regarding your visit with us today. Please take a moment to fill this out. Your feedback is very important to our whole office. It can help us better understand your needs as well as improve your experience and satisfaction. Thank you for taking your time to complete it. We care about you.  Janiyla Long, PA-C  

## 2016-11-07 NOTE — Progress Notes (Signed)
BP 120/66   Pulse 64   Temp 98 F (36.7 C) (Oral)   Ht '5\' 5"'  (1.651 m)   Wt 214 lb (97.1 kg)   BMI 35.61 kg/m    Subjective:    Patient ID: Taylor Leach, female    DOB: [birth year], 61 y.o.   MRN: [id number]  HPI: Taylor Leach is a 61 y.o. female presenting on 11/07/2016 for Follow-up (3 month ); Diabetes; and Hyperlipidemia  This patient comes in for periodic recheck on medications and conditions including hypertension, GERD, autoimmune hepatitis, NIDDM, hyperlipidemia. She reports doing well and does not need anything else. She will go to Dr. Drue Novel in October for recheck. Stopped bydureon due to nausea. Has gone on a keto diet.   All medications are reviewed today. There are no reports of any problems with the medications. All of the medical conditions are reviewed and updated.  Lab work is reviewed and will be ordered as medically necessary. There are no new problems reported with today's visit.   Relevant past medical, surgical, family and social history reviewed and updated as indicated. Allergies and medications reviewed and updated.  Past Medical History:  Diagnosis Date  . Arthritis   . Autoimmune hepatitis ([place])   . Chest pain    Stress echo normal, October, 2012  . Dyslipidemia   . Ejection fraction    EF 55-60%, echo, October, 2012  . GERD (gastroesophageal reflux disease)     Past Surgical History:  Procedure Laterality Date  . CHOLECYSTECTOMY    . NASAL SINUS SURGERY    . VAGINAL HYSTERECTOMY      Review of Systems  Constitutional: Negative.  Negative for activity change, fatigue and fever.  HENT: Negative.   Eyes: Negative.   Respiratory: Negative.  Negative for cough.   Cardiovascular: Negative.  Negative for chest pain.  Gastrointestinal: Negative.  Negative for abdominal pain.  Endocrine: Negative.   Genitourinary: Negative.  Negative for dysuria.  Musculoskeletal: Positive for arthralgias and myalgias. Negative for joint swelling.  Skin: Negative.    Neurological: Negative.     Allergies as of 11/07/2016      Reactions   Fenofibrate Micronized Other (See Comments)   Caused increased LFT's Caused increased LFT's   Codeine    Hydrocodone Nausea And Vomiting, Other (See Comments)   Makes body hot.    Hydrocodone-acetaminophen Nausea And Vomiting      Medication List       Accurate as of 11/07/16  8:33 AM. Always use your most recent med list.          atorvastatin 10 MG tablet Commonly known as:  LIPITOR   fluticasone 50 MCG/ACT nasal spray Commonly known as:  FLONASE Place 2 sprays into both nostrils daily. Sinus Allergies   ibuprofen 200 MG tablet Commonly known as:  ADVIL,MOTRIN Take 200 mg by mouth every 6 (six) hours as needed.   magnesium 30 MG tablet Take 30 mg by mouth 2 (two) times daily.   mercaptopurine 50 MG tablet Commonly known as:  PURINETHOL Take 50 mg by mouth daily. Give on an empty stomach 1 hour before or 2 hours after meals. Caution: Chemotherapy.   metFORMIN 500 MG tablet Commonly known as:  GLUCOPHAGE Take 1 tablet (500 mg total) by mouth 2 (two) times daily with a meal.   pantoprazole 40 MG tablet Commonly known as:  PROTONIX Take 1 tablet (40 mg total) by mouth daily.   vitamin B-12 1000 MCG tablet Commonly known  as:  CYANOCOBALAMIN Take 1,000 mcg by mouth daily.   vitamin E 1000 UNIT capsule Take 1,000 Units by mouth daily.          Objective:    BP 120/66   Pulse 64   Temp 98 F (36.7 C) (Oral)   Ht '5\' 5"'  (1.651 m)   Wt 214 lb (97.1 kg)   BMI 35.61 kg/m   Allergies  Allergen Reactions  . Fenofibrate Micronized Other (See Comments)    Caused increased LFT's Caused increased LFT's  . Codeine   . Hydrocodone Nausea And Vomiting and Other (See Comments)    Makes body hot.   . Hydrocodone-Acetaminophen Nausea And Vomiting    Physical Exam  Constitutional: She is oriented to person, place, and time. She appears well-developed and well-nourished.  HENT:  Head:  Normocephalic and atraumatic.  Right Ear: Tympanic membrane, external ear and ear canal normal.  Left Ear: Tympanic membrane, external ear and ear canal normal.  Nose: Nose normal. No rhinorrhea.  Mouth/Throat: Oropharynx is clear and moist and mucous membranes are normal. No oropharyngeal exudate or posterior oropharyngeal erythema.  Eyes: Pupils are equal, round, and reactive to light. Conjunctivae and EOM are normal.  Neck: Normal range of motion. Neck supple.  Cardiovascular: Normal rate, regular rhythm, normal heart sounds and intact distal pulses.   Pulmonary/Chest: Effort normal and breath sounds normal.  Abdominal: Soft. Bowel sounds are normal.  Neurological: She is alert and oriented to person, place, and time. She has normal reflexes.  Skin: Skin is warm and dry. No rash noted.  Psychiatric: She has a normal mood and affect. Her behavior is normal. Judgment and thought content normal.  Nursing note and vitals reviewed.   Results for orders placed or performed in visit on 09/14/16  HM DIABETES EYE EXAM  Result Value Ref Range   HM Diabetic Eye Exam No Retinopathy No Retinopathy      Assessment & Plan:   1. Essential hypertension - CMP14+EGFR - CBC with Differential/Platelet - TSH  2. Gastroesophageal reflux disease without esophagitis  3. Autoimmune hepatitis ([place])  4. Hyperlipidemia associated with type 2 diabetes mellitus (HCC) - CMP14+EGFR - CBC with Differential/Platelet - Lipid panel - TSH - Bayer DCA Hb A1c Waived - metFORMIN (GLUCOPHAGE) 500 MG tablet; Take 1 tablet (500 mg total) by mouth 2 (two) times daily with a meal.  Dispense: 180 tablet; Refill: 3   Current Outpatient Prescriptions:  .  atorvastatin (LIPITOR) 10 MG tablet, , Disp: , Rfl: 10 .  fluticasone (FLONASE) 50 MCG/ACT nasal spray, Place 2 sprays into both nostrils daily. Sinus Allergies, Disp: 16 g, Rfl: 11 .  ibuprofen (ADVIL,MOTRIN) 200 MG tablet, Take 200 mg by mouth every 6 (six) hours  as needed.  , Disp: , Rfl:  .  magnesium 30 MG tablet, Take 30 mg by mouth 2 (two) times daily., Disp: , Rfl:  .  mercaptopurine (PURINETHOL) 50 MG tablet, Take 50 mg by mouth daily. Give on an empty stomach 1 hour before or 2 hours after meals. Caution: Chemotherapy., Disp: , Rfl:  .  pantoprazole (PROTONIX) 40 MG tablet, Take 1 tablet (40 mg total) by mouth daily., Disp: 30 tablet, Rfl: 11 .  vitamin B-12 (CYANOCOBALAMIN) 1000 MCG tablet, Take 1,000 mcg by mouth daily., Disp: , Rfl:  .  vitamin E 1000 UNIT capsule, Take 1,000 Units by mouth daily., Disp: , Rfl:  .  metFORMIN (GLUCOPHAGE) 500 MG tablet, Take 1 tablet (500 mg total) by mouth  2 (two) times daily with a meal., Disp: 180 tablet, Rfl: 3  Continue all other maintenance medications as listed above.  Follow up plan: Return in about 3 months (around 02/07/2017) for recheck.  Educational handout given for [place] PA-C [facility] [address]  [place], [place] [id number] [phone]   11/07/2016, 8:33 AM

## 2016-11-08 LAB — CBC WITH DIFFERENTIAL/PLATELET
Basophils Absolute: 0 10*3/uL (ref 0.0–0.2)
Basos: 1 %
EOS (ABSOLUTE): 0.3 10*3/uL (ref 0.0–0.4)
EOS: 7 %
HEMATOCRIT: 40 % (ref 34.0–46.6)
HEMOGLOBIN: 13.6 g/dL (ref 11.1–15.9)
IMMATURE GRANS (ABS): 0 10*3/uL (ref 0.0–0.1)
Immature Granulocytes: 0 %
LYMPHS ABS: 1.2 10*3/uL (ref 0.7–3.1)
LYMPHS: 27 %
MCH: 34 pg — ABNORMAL HIGH (ref 26.6–33.0)
MCHC: 34 g/dL (ref 31.5–35.7)
MCV: 100 fL — ABNORMAL HIGH (ref 79–97)
MONOCYTES: 11 %
Monocytes Absolute: 0.5 10*3/uL (ref 0.1–0.9)
NEUTROS ABS: 2.4 10*3/uL (ref 1.4–7.0)
Neutrophils: 54 %
Platelets: 243 10*3/uL (ref 150–379)
RBC: 4 x10E6/uL (ref 3.77–5.28)
RDW: 14.1 % (ref 12.3–15.4)
WBC: 4.5 10*3/uL (ref 3.4–10.8)

## 2016-11-08 LAB — LIPID PANEL
CHOL/HDL RATIO: 3.5 ratio (ref 0.0–4.4)
CHOLESTEROL TOTAL: 172 mg/dL (ref 100–199)
HDL: 49 mg/dL (ref >39)
LDL Calculated: 95 mg/dL (ref 0–99)
Triglycerides: 140 mg/dL (ref 0–149)
VLDL Cholesterol Cal: 28 mg/dL (ref 5–40)

## 2016-11-08 LAB — CMP14+EGFR
ALBUMIN: 4.1 g/dL (ref 3.6–4.8)
ALT: 55 IU/L — ABNORMAL HIGH (ref 0–32)
AST: 29 IU/L (ref 0–40)
Albumin/Globulin Ratio: 1.4 (ref 1.2–2.2)
Alkaline Phosphatase: 54 IU/L (ref 39–117)
BUN / CREAT RATIO: 12 (ref 12–28)
BUN: 11 mg/dL (ref 8–27)
Bilirubin Total: 0.3 mg/dL (ref 0.0–1.2)
CO2: 23 mmol/L (ref 20–29)
CREATININE: 0.9 mg/dL (ref 0.57–1.00)
Calcium: 9.2 mg/dL (ref 8.7–10.3)
Chloride: 103 mmol/L (ref 96–106)
GFR calc non Af Amer: 70 mL/min/{1.73_m2} (ref >59)
GFR, EST AFRICAN AMERICAN: 80 mL/min/{1.73_m2} (ref >59)
GLUCOSE: 131 mg/dL — AB (ref 65–99)
Globulin, Total: 2.9 g/dL (ref 1.5–4.5)
Potassium: 4.2 mmol/L (ref 3.5–5.2)
Sodium: 141 mmol/L (ref 134–144)
TOTAL PROTEIN: 7 g/dL (ref 6.0–8.5)

## 2016-11-08 LAB — TSH: TSH: 3.61 u[IU]/mL (ref 0.450–4.500)

## 2017-01-03 DIAGNOSIS — M7742 Metatarsalgia, left foot: Secondary | ICD-10-CM | POA: Diagnosis not present

## 2017-01-03 DIAGNOSIS — M79672 Pain in left foot: Secondary | ICD-10-CM | POA: Diagnosis not present

## 2017-02-18 DIAGNOSIS — Z1231 Encounter for screening mammogram for malignant neoplasm of breast: Secondary | ICD-10-CM | POA: Diagnosis not present

## 2017-02-21 DIAGNOSIS — M7742 Metatarsalgia, left foot: Secondary | ICD-10-CM | POA: Diagnosis not present

## 2017-03-07 DIAGNOSIS — M7742 Metatarsalgia, left foot: Secondary | ICD-10-CM | POA: Diagnosis not present

## 2017-03-07 DIAGNOSIS — M79672 Pain in left foot: Secondary | ICD-10-CM | POA: Diagnosis not present

## 2017-03-16 DIAGNOSIS — E119 Type 2 diabetes mellitus without complications: Secondary | ICD-10-CM | POA: Diagnosis not present

## 2017-03-16 DIAGNOSIS — Z79899 Other long term (current) drug therapy: Secondary | ICD-10-CM | POA: Diagnosis not present

## 2017-03-16 DIAGNOSIS — M25572 Pain in left ankle and joints of left foot: Secondary | ICD-10-CM | POA: Diagnosis not present

## 2017-03-16 DIAGNOSIS — M7742 Metatarsalgia, left foot: Secondary | ICD-10-CM | POA: Diagnosis not present

## 2017-03-16 DIAGNOSIS — M2042 Other hammer toe(s) (acquired), left foot: Secondary | ICD-10-CM | POA: Diagnosis not present

## 2017-03-16 DIAGNOSIS — Z886 Allergy status to analgesic agent status: Secondary | ICD-10-CM | POA: Diagnosis not present

## 2017-03-16 DIAGNOSIS — K219 Gastro-esophageal reflux disease without esophagitis: Secondary | ICD-10-CM | POA: Diagnosis not present

## 2017-03-16 DIAGNOSIS — Z7984 Long term (current) use of oral hypoglycemic drugs: Secondary | ICD-10-CM | POA: Diagnosis not present

## 2017-03-16 DIAGNOSIS — M216X2 Other acquired deformities of left foot: Secondary | ICD-10-CM | POA: Diagnosis not present

## 2017-03-16 DIAGNOSIS — G5762 Lesion of plantar nerve, left lower limb: Secondary | ICD-10-CM | POA: Diagnosis not present

## 2017-03-30 DIAGNOSIS — M216X2 Other acquired deformities of left foot: Secondary | ICD-10-CM | POA: Diagnosis not present

## 2017-03-30 DIAGNOSIS — M79672 Pain in left foot: Secondary | ICD-10-CM | POA: Diagnosis not present

## 2017-03-30 DIAGNOSIS — M7742 Metatarsalgia, left foot: Secondary | ICD-10-CM | POA: Diagnosis not present

## 2017-04-04 HISTORY — PX: LIVER BIOPSY: SHX301

## 2017-05-02 DIAGNOSIS — M79672 Pain in left foot: Secondary | ICD-10-CM | POA: Diagnosis not present

## 2017-05-02 DIAGNOSIS — M216X2 Other acquired deformities of left foot: Secondary | ICD-10-CM | POA: Diagnosis not present

## 2017-05-16 ENCOUNTER — Ambulatory Visit (INDEPENDENT_AMBULATORY_CARE_PROVIDER_SITE_OTHER): Payer: BLUE CROSS/BLUE SHIELD | Admitting: Physician Assistant

## 2017-05-16 ENCOUNTER — Encounter: Payer: Self-pay | Admitting: Physician Assistant

## 2017-05-16 VITALS — BP 135/77 | HR 69 | Temp 97.3°F | Ht 65.0 in | Wt 230.2 lb

## 2017-05-16 DIAGNOSIS — E119 Type 2 diabetes mellitus without complications: Secondary | ICD-10-CM | POA: Diagnosis not present

## 2017-05-16 DIAGNOSIS — K754 Autoimmune hepatitis: Secondary | ICD-10-CM

## 2017-05-16 DIAGNOSIS — J301 Allergic rhinitis due to pollen: Secondary | ICD-10-CM

## 2017-05-16 DIAGNOSIS — I10 Essential (primary) hypertension: Secondary | ICD-10-CM | POA: Diagnosis not present

## 2017-05-16 LAB — BAYER DCA HB A1C WAIVED: HB A1C (BAYER DCA - WAIVED): 7.6 % — ABNORMAL HIGH (ref <7.0)

## 2017-05-16 MED ORDER — FLUTICASONE PROPIONATE 50 MCG/ACT NA SUSP: 2.0000 | Freq: Every day | NASAL | 11 refills | Status: DC

## 2017-05-16 MED ORDER — BLOOD GLUCOSE MONITOR KIT: PACK | 0 refills | Status: AC

## 2017-05-16 NOTE — Progress Notes (Signed)
BP 135/77   Pulse 69   Temp (!) 97.3 F (36.3 C) (Oral)   Ht '5\' 5"'  (1.651 m)   Wt 230 lb 3.2 oz (104.4 kg)   BMI 38.31 kg/m    Subjective:    Patient ID: Taylor Leach, female    DOB: 02/26/1956, 62 y.o.   MRN: [id number]  HPI: Taylor Leach is a 62 y.o. female presenting on 05/16/2017 for Follow-up (6 month ) This patient comes in for periodic recheck on medications and conditions including hypertension, diabetes, autoimmune hepatitis and allergic rhinitis.  Overall she is doing fairly well.  She had foot surgery.  She has not fully recovered from it.  She is back in regular shoes.  She has limited walking at this time.  She knows she has gained weight in regards to having much less activity.  She is going to work hard on her diet modifications at this time.  We will have labs drawn today.  All medications are reviewed today. There are no reports of any problems with the medications. All of the medical conditions are reviewed and updated.  Lab work is reviewed and will be ordered as medically necessary. There are no new problems reported with today's visit.    Past Medical History:  Diagnosis Date  . Arthritis   . Autoimmune hepatitis ([place])   . Chest pain    Stress echo normal, October, 2012  . Dyslipidemia   . Ejection fraction    EF 55-60%, echo, October, 2012  . GERD (gastroesophageal reflux disease)    Relevant past medical, surgical, family and social history reviewed and updated as indicated. Interim medical history since our last visit reviewed. Allergies and medications reviewed and updated. DATA REVIEWED: CHART IN EPIC  Family History reviewed for pertinent findings.  Review of Systems  Constitutional: Negative.  Negative for activity change, fatigue and fever.  HENT: Negative.   Eyes: Negative.   Respiratory: Negative.  Negative for cough.   Cardiovascular: Negative.  Negative for chest pain.  Gastrointestinal: Negative.  Negative for abdominal pain.  Endocrine:  Negative.   Genitourinary: Negative.  Negative for dysuria.  Musculoskeletal: Negative.   Skin: Negative.   Neurological: Negative.     Allergies as of 05/16/2017      Reactions   Fenofibrate Micronized Other (See Comments)   Caused increased LFT's Caused increased LFT's   Codeine    Hydrocodone Nausea And Vomiting, Other (See Comments)   Makes body hot.    Hydrocodone-acetaminophen Nausea And Vomiting      Medication List        Accurate as of 05/16/17  8:44 AM. Always use your most recent med list.          atorvastatin 10 MG tablet Commonly known as:  LIPITOR   blood glucose meter kit and supplies Kit Dispense based on patient and insurance preference. Use up to four times daily as directed. (FOR ICD-9 250.00, 250.01). E11.9   fluticasone 50 MCG/ACT nasal spray Commonly known as:  FLONASE Place 2 sprays into both nostrils daily. Sinus Allergies   ibuprofen 200 MG tablet Commonly known as:  ADVIL,MOTRIN Take 200 mg by mouth every 6 (six) hours as needed.   magnesium 30 MG tablet Take 30 mg by mouth 2 (two) times daily.   mercaptopurine 50 MG tablet Commonly known as:  PURINETHOL Take 50 mg by mouth daily. Give on an empty stomach 1 hour before or 2 hours after meals. Caution: Chemotherapy.  metFORMIN 500 MG tablet Commonly known as:  GLUCOPHAGE Take 1 tablet (500 mg total) by mouth 2 (two) times daily with a meal.   pantoprazole 40 MG tablet Commonly known as:  PROTONIX Take 1 tablet (40 mg total) by mouth daily.   vitamin B-12 1000 MCG tablet Commonly known as:  CYANOCOBALAMIN Take 1,000 mcg by mouth daily.   vitamin E 1000 UNIT capsule Take 1,000 Units by mouth daily.          Objective:    BP 135/77   Pulse 69   Temp (!) 97.3 F (36.3 C) (Oral)   Ht '5\' 5"'  (1.651 m)   Wt 230 lb 3.2 oz (104.4 kg)   BMI 38.31 kg/m   Allergies  Allergen Reactions  . Fenofibrate Micronized Other (See Comments)    Caused increased LFT's Caused increased  LFT's  . Codeine   . Hydrocodone Nausea And Vomiting and Other (See Comments)    Makes body hot.   . Hydrocodone-Acetaminophen Nausea And Vomiting    Wt Readings from Last 3 Encounters:  05/16/17 230 lb 3.2 oz (104.4 kg)  11/07/16 214 lb (97.1 kg)  08/22/16 225 lb (102.1 kg)    Physical Exam  Constitutional: She is oriented to person, place, and time. She appears well-developed and well-nourished.  HENT:  Head: Normocephalic and atraumatic.  Right Ear: Tympanic membrane, external ear and ear canal normal.  Left Ear: Tympanic membrane, external ear and ear canal normal.  Nose: Nose normal. No rhinorrhea.  Mouth/Throat: Oropharynx is clear and moist and mucous membranes are normal. No oropharyngeal exudate or posterior oropharyngeal erythema.  Eyes: Conjunctivae and EOM are normal. Pupils are equal, round, and reactive to light.  Neck: Normal range of motion. Neck supple.  Cardiovascular: Normal rate, regular rhythm, normal heart sounds and intact distal pulses.  Pulmonary/Chest: Effort normal and breath sounds normal.  Abdominal: Soft. Bowel sounds are normal.  Neurological: She is alert and oriented to person, place, and time. She has normal reflexes.  Skin: Skin is warm and dry. No rash noted.  Psychiatric: She has a normal mood and affect. Her behavior is normal. Judgment and thought content normal.  Nursing note and vitals reviewed.   Results for orders placed or performed in visit on 11/07/16  CMP14+EGFR  Result Value Ref Range   Glucose 131 (H) 65 - 99 mg/dL   BUN 11 8 - 27 mg/dL   Creatinine, Ser 0.90 0.57 - 1.00 mg/dL   GFR calc non Af Amer 70 >59 mL/min/1.73   GFR calc Af Amer 80 >59 mL/min/1.73   BUN/Creatinine Ratio 12 12 - 28   Sodium 141 134 - 144 mmol/L   Potassium 4.2 3.5 - 5.2 mmol/L   Chloride 103 96 - 106 mmol/L   CO2 23 20 - 29 mmol/L   Calcium 9.2 8.7 - 10.3 mg/dL   Total Protein 7.0 6.0 - 8.5 g/dL   Albumin 4.1 3.6 - 4.8 g/dL   Globulin, Total 2.9  1.5 - 4.5 g/dL   Albumin/Globulin Ratio 1.4 1.2 - 2.2   Bilirubin Total 0.3 0.0 - 1.2 mg/dL   Alkaline Phosphatase 54 39 - 117 IU/L   AST 29 0 - 40 IU/L   ALT 55 (H) 0 - 32 IU/L  CBC with Differential/Platelet  Result Value Ref Range   WBC 4.5 3.4 - 10.8 x10E3/uL   RBC 4.00 3.77 - 5.28 x10E6/uL   Hemoglobin 13.6 11.1 - 15.9 g/dL   Hematocrit 40.0 34.0 - 46.6 %  MCV 100 (H) 79 - 97 fL   MCH 34.0 (H) 26.6 - 33.0 pg   MCHC 34.0 31.5 - 35.7 g/dL   RDW 14.1 12.3 - 15.4 %   Platelets 243 150 - 379 x10E3/uL   Neutrophils 54 Not Estab. %   Lymphs 27 Not Estab. %   Monocytes 11 Not Estab. %   Eos 7 Not Estab. %   Basos 1 Not Estab. %   Neutrophils Absolute 2.4 1.4 - 7.0 x10E3/uL   Lymphocytes Absolute 1.2 0.7 - 3.1 x10E3/uL   Monocytes Absolute 0.5 0.1 - 0.9 x10E3/uL   EOS (ABSOLUTE) 0.3 0.0 - 0.4 x10E3/uL   Basophils Absolute 0.0 0.0 - 0.2 x10E3/uL   Immature Granulocytes 0 Not Estab. %   Immature Grans (Abs) 0.0 0.0 - 0.1 x10E3/uL  Lipid panel  Result Value Ref Range   Cholesterol, Total 172 100 - 199 mg/dL   Triglycerides 140 0 - 149 mg/dL   HDL 49 >39 mg/dL   VLDL Cholesterol Cal 28 5 - 40 mg/dL   LDL Calculated 95 0 - 99 mg/dL   Chol/HDL Ratio 3.5 0.0 - 4.4 ratio  TSH  Result Value Ref Range   TSH 3.610 0.450 - 4.500 uIU/mL  Bayer DCA Hb A1c Waived  Result Value Ref Range   Bayer DCA Hb A1c Waived 6.1 <7.0 %      Assessment & Plan:   1. Chronic allergic rhinitis due to pollen - fluticasone (FLONASE) 50 MCG/ACT nasal spray; Place 2 sprays into both nostrils daily. Sinus Allergies  Dispense: 16 g; Refill: 11  2. Essential hypertension - CBC with Differential/Platelet - CMP14+EGFR - Lipid panel  3. Autoimmune hepatitis ([place]) - CBC with Differential/Platelet - CMP14+EGFR  4. Diabetes mellitus without complication ([place]) - CBC with Differential/Platelet - Lipid panel - Thyroid Panel With TSH - blood glucose meter kit and supplies KIT; Dispense based on patient  and insurance preference. Use up to four times daily as directed. (FOR ICD-9 250.00, 250.01). E11.9  Dispense: 1 each; Refill: 0 - Bayer DCA Hb A1c Waived   Continue all other maintenance medications as listed above.  Follow up plan: Recheck 6 months  Educational handout given for [place] PA-C [facility] [address]  [place], [place] [address] [phone]   05/16/2017, 8:44 AM

## 2017-05-17 LAB — CMP14+EGFR
A/G RATIO: 1.2 (ref 1.2–2.2)
ALT: 74 IU/L — AB (ref 0–32)
AST: 35 IU/L (ref 0–40)
Albumin: 4.2 g/dL (ref 3.6–4.8)
Alkaline Phosphatase: 79 IU/L (ref 39–117)
BILIRUBIN TOTAL: 0.4 mg/dL (ref 0.0–1.2)
BUN/Creatinine Ratio: 14 (ref 12–28)
BUN: 13 mg/dL (ref 8–27)
CHLORIDE: 106 mmol/L (ref 96–106)
CO2: 19 mmol/L — ABNORMAL LOW (ref 20–29)
Calcium: 9.4 mg/dL (ref 8.7–10.3)
Creatinine, Ser: 0.92 mg/dL (ref 0.57–1.00)
GFR calc non Af Amer: 67 mL/min/{1.73_m2} (ref >59)
GFR, EST AFRICAN AMERICAN: 78 mL/min/{1.73_m2} (ref >59)
Globulin, Total: 3.4 g/dL (ref 1.5–4.5)
Glucose: 165 mg/dL — ABNORMAL HIGH (ref 65–99)
POTASSIUM: 4.7 mmol/L (ref 3.5–5.2)
Sodium: 143 mmol/L (ref 134–144)
Total Protein: 7.6 g/dL (ref 6.0–8.5)

## 2017-05-17 LAB — LIPID PANEL
Chol/HDL Ratio: 3.2 ratio (ref 0.0–4.4)
Cholesterol, Total: 155 mg/dL (ref 100–199)
HDL: 48 mg/dL (ref >39)
LDL Calculated: 68 mg/dL (ref 0–99)
Triglycerides: 193 mg/dL — ABNORMAL HIGH (ref 0–149)
VLDL Cholesterol Cal: 39 mg/dL (ref 5–40)

## 2017-05-17 LAB — CBC WITH DIFFERENTIAL/PLATELET
BASOS: 0 %
Basophils Absolute: 0 10*3/uL (ref 0.0–0.2)
EOS (ABSOLUTE): 0.2 10*3/uL (ref 0.0–0.4)
Eos: 5 %
Hematocrit: 42.1 % (ref 34.0–46.6)
Hemoglobin: 14.4 g/dL (ref 11.1–15.9)
Immature Grans (Abs): 0 10*3/uL (ref 0.0–0.1)
Immature Granulocytes: 1 %
Lymphocytes Absolute: 1.4 10*3/uL (ref 0.7–3.1)
Lymphs: 30 %
MCH: 33.6 pg — ABNORMAL HIGH (ref 26.6–33.0)
MCHC: 34.2 g/dL (ref 31.5–35.7)
MCV: 98 fL — AB (ref 79–97)
Monocytes Absolute: 0.5 10*3/uL (ref 0.1–0.9)
Monocytes: 10 %
NEUTROS PCT: 54 %
Neutrophils Absolute: 2.5 10*3/uL (ref 1.4–7.0)
PLATELETS: 211 10*3/uL (ref 150–379)
RBC: 4.29 x10E6/uL (ref 3.77–5.28)
RDW: 13.8 % (ref 12.3–15.4)
WBC: 4.6 10*3/uL (ref 3.4–10.8)

## 2017-05-17 LAB — THYROID PANEL WITH TSH
FREE THYROXINE INDEX: 2.3 (ref 1.2–4.9)
T3 UPTAKE RATIO: 27 % (ref 24–39)
T4 TOTAL: 8.5 ug/dL (ref 4.5–12.0)
TSH: 2.72 u[IU]/mL (ref 0.450–4.500)

## 2017-05-30 ENCOUNTER — Ambulatory Visit (INDEPENDENT_AMBULATORY_CARE_PROVIDER_SITE_OTHER): Payer: BLUE CROSS/BLUE SHIELD | Admitting: Physician Assistant

## 2017-05-30 ENCOUNTER — Ambulatory Visit: Payer: BLUE CROSS/BLUE SHIELD | Admitting: Family Medicine

## 2017-05-30 ENCOUNTER — Encounter: Payer: Self-pay | Admitting: Physician Assistant

## 2017-05-30 VITALS — BP 142/83 | HR 69 | Temp 97.2°F | Ht 65.0 in | Wt 228.0 lb

## 2017-05-30 DIAGNOSIS — N3000 Acute cystitis without hematuria: Secondary | ICD-10-CM

## 2017-05-30 DIAGNOSIS — J0110 Acute frontal sinusitis, unspecified: Secondary | ICD-10-CM

## 2017-05-30 DIAGNOSIS — R300 Dysuria: Secondary | ICD-10-CM | POA: Diagnosis not present

## 2017-05-30 LAB — URINALYSIS, COMPLETE
BILIRUBIN UA: NEGATIVE
Ketones, UA: NEGATIVE
LEUKOCYTES UA: NEGATIVE
Nitrite, UA: NEGATIVE
PH UA: 5.5 (ref 5.0–7.5)
PROTEIN UA: NEGATIVE
RBC UA: NEGATIVE
Specific Gravity, UA: 1.015 (ref 1.005–1.030)
Urobilinogen, Ur: 0.2 mg/dL (ref 0.2–1.0)

## 2017-05-30 LAB — MICROSCOPIC EXAMINATION
RBC, UA: NONE SEEN /hpf (ref 0–?)
Renal Epithel, UA: NONE SEEN /hpf

## 2017-05-30 MED ORDER — NAPROXEN 500 MG PO TABS: 500.0000 mg | ORAL_TABLET | Freq: Two times a day (BID) | ORAL | 1 refills | Status: DC

## 2017-05-30 MED ORDER — SULFAMETHOXAZOLE-TRIMETHOPRIM 800-160 MG PO TABS: 1.0000 | ORAL_TABLET | Freq: Two times a day (BID) | ORAL | 0 refills | Status: DC

## 2017-05-30 NOTE — Progress Notes (Signed)
BP (!) 142/83   Pulse 69   Temp (!) 97.2 F (36.2 C) (Oral)   Ht _0  (1.651 m)   Wt 228 lb (103.4 kg)   BMI 37.94 kg/m    Subjective:    Patient ID: Taylor Leach, female    DOB: [birth year], 62 y.o.   MRN: [id number]  HPI: Taylor Leach is a 62 y.o. female presenting on 05/30/2017 for Sinus Problem and Urinary Tract Infection  This patient has had several days of dysuria, frequency and nocturia. There is also pain over the bladder in the suprapubic region, no back pain. Denies leakage or hematuria.  Denies fever or chills. No pain in flank area. This patient has had many days of sinus headache and postnasal drainage. There is copious drainage at times. Denies any fever at this time. There has been a history of sinus infections in the past.  No history of sinus surgery. There is cough at night. It has become more prevalent in recent days.  Past Medical History:  Diagnosis Date  . Arthritis   . Autoimmune hepatitis ([place])   . Chest pain    Stress echo normal, October, 2012  . Dyslipidemia   . Ejection fraction    EF 55-60%, echo, October, 2012  . GERD (gastroesophageal reflux disease)    Relevant past medical, surgical, family and social history reviewed and updated as indicated. Interim medical history since our last visit reviewed. Allergies and medications reviewed and updated. DATA REVIEWED: CHART IN EPIC  Family History reviewed for pertinent findings.  Review of Systems  Constitutional: Positive for chills and fatigue. Negative for activity change, appetite change and fever.  HENT: Positive for congestion, postnasal drip, sinus pain and sore throat.   Eyes: Negative.   Respiratory: Positive for cough. Negative for shortness of breath and wheezing.   Cardiovascular: Negative.  Negative for chest pain, palpitations and leg swelling.  Gastrointestinal: Negative.   Genitourinary: Positive for frequency and urgency. Negative for hematuria.  Musculoskeletal: Negative.   Skin:  Negative.   Neurological: Positive for headaches.    Allergies as of 05/30/2017      Reactions   Fenofibrate Micronized Other (See Comments)   Caused increased LFT's Caused increased LFT's   Codeine    Hydrocodone Nausea And Vomiting, Other (See Comments)   Makes body hot.    Hydrocodone-acetaminophen Nausea And Vomiting      Medication List        Accurate as of 05/30/17 12:12 PM. Always use your most recent med list.          atorvastatin 10 MG tablet Commonly known as:  LIPITOR   blood glucose meter kit and supplies Kit Dispense based on patient and insurance preference. Use up to four times daily as directed. (FOR ICD-9 250.00, 250.01). E11.9   fluticasone 50 MCG/ACT nasal spray Commonly known as:  FLONASE Place 2 sprays into both nostrils daily. Sinus Allergies   ibuprofen 200 MG tablet Commonly known as:  ADVIL,MOTRIN Take 200 mg by mouth every 6 (six) hours as needed.   magnesium 30 MG tablet Take 30 mg by mouth 2 (two) times daily.   mercaptopurine 50 MG tablet Commonly known as:  PURINETHOL Take 50 mg by mouth daily. Give on an empty stomach 1 hour before or 2 hours after meals. Caution: Chemotherapy.   metFORMIN 500 MG tablet Commonly known as:  GLUCOPHAGE Take 1 tablet (500 mg total) by mouth 2 (two) times daily with a meal.  naproxen 500 MG tablet Commonly known as:  NAPROSYN Take 1 tablet (500 mg total) by mouth 2 (two) times daily with a meal.   pantoprazole 40 MG tablet Commonly known as:  PROTONIX Take 1 tablet (40 mg total) by mouth daily.   sulfamethoxazole-trimethoprim 800-160 MG tablet Commonly known as:  BACTRIM DS Take 1 tablet by mouth 2 (two) times daily.   vitamin B-12 1000 MCG tablet Commonly known as:  CYANOCOBALAMIN Take 1,000 mcg by mouth daily.   vitamin E 1000 UNIT capsule Take 1,000 Units by mouth daily.          Objective:    BP (!) 142/83   Pulse 69   Temp (!) 97.2 F (36.2 C) (Oral)   Ht _0  (1.651 m)    Wt 228 lb (103.4 kg)   BMI 37.94 kg/m   Allergies  Allergen Reactions  . Fenofibrate Micronized Other (See Comments)    Caused increased LFT's Caused increased LFT's  . Codeine   . Hydrocodone Nausea And Vomiting and Other (See Comments)    Makes body hot.   . Hydrocodone-Acetaminophen Nausea And Vomiting    Wt Readings from Last 3 Encounters:  05/30/17 228 lb (103.4 kg)  05/16/17 230 lb 3.2 oz (104.4 kg)  11/07/16 214 lb (97.1 kg)    Physical Exam  Constitutional: She is oriented to person, place, and time. She appears well-developed and well-nourished.  HENT:  Head: Normocephalic and atraumatic.  Right Ear: Tympanic membrane and external ear normal. No middle ear effusion.  Left Ear: Tympanic membrane and external ear normal.  No middle ear effusion.  Nose: Mucosal edema and rhinorrhea present. Right sinus exhibits no maxillary sinus tenderness. Left sinus exhibits no maxillary sinus tenderness.  Mouth/Throat: Uvula is midline. Posterior oropharyngeal erythema present.  Eyes: Conjunctivae and EOM are normal. Pupils are equal, round, and reactive to light. Right eye exhibits no discharge. Left eye exhibits no discharge.  Neck: Normal range of motion.  Cardiovascular: Normal rate, regular rhythm and normal heart sounds.  Pulmonary/Chest: Effort normal and breath sounds normal. No respiratory distress. She has no wheezes.  Abdominal: Soft. She exhibits no distension and no mass. There is tenderness. There is no rebound and no guarding.  Lymphadenopathy:    She has no cervical adenopathy.  Neurological: She is alert and oriented to person, place, and time.  Skin: Skin is warm and dry.  Psychiatric: She has a normal mood and affect.    Results for orders placed or performed in visit on 05/30/17  Microscopic Examination  Result Value Ref Range   WBC, UA 0-5 0 - 5 /hpf   RBC, UA None seen 0 - 2 /hpf   Epithelial Cells (non renal) 0-10 0 - 10 /hpf   Renal Epithel, UA None seen  None seen /hpf   Bacteria, UA Few None seen/Few  Urinalysis, Complete  Result Value Ref Range   Specific Gravity, UA 1.015 1.005 - 1.030   pH, UA 5.5 5.0 - 7.5   Color, UA Yellow Yellow   Appearance Ur Clear Clear   Leukocytes, UA Negative Negative   Protein, UA Negative Negative/Trace   Glucose, UA 1+ (A) Negative   Ketones, UA Negative Negative   RBC, UA Negative Negative   Bilirubin, UA Negative Negative   Urobilinogen, Ur 0.2 0.2 - 1.0 mg/dL   Nitrite, UA Negative Negative   Microscopic Examination See below:       Assessment & Plan:   1. Dysuria - Urine  Culture - Urinalysis, Complete - Microscopic Examination  2. Acute cystitis without hematuria - sulfamethoxazole-trimethoprim (BACTRIM DS) 800-160 MG tablet; Take 1 tablet by mouth 2 (two) times daily.  Dispense: 14 tablet; Refill: 0  3. Acute non-recurrent frontal sinusitis - sulfamethoxazole-trimethoprim (BACTRIM DS) 800-160 MG tablet; Take 1 tablet by mouth 2 (two) times daily.  Dispense: 14 tablet; Refill: 0   Continue all other maintenance medications as listed above.  Follow up plan: No Follow-up on file.  Educational handout given for [place] PA-C [facility] [address]  [place], [place] [address] [phone]   05/30/2017, 12:12 PM

## 2017-05-30 NOTE — Progress Notes (Deleted)
Subjective: CC: ?sinus inf/ UTI PCP: Terald Sleeper, PA-C Taylor Leach is a 62 y.o. female presenting to clinic today for:  1. Cold symptoms  Patient reports *** that started ***.  *** cough, hemoptysis, congestion, rhinorrhea, sinus pressure, headache, SOB, dizziness, rash, nausea, vomiting, diarrhea, fevers, chills, myalgia, sick contacts, recent travel.  Patient has used *** with *** relief of symptoms.  *** history of COPD or asthma.  *** tobacco use/ exposure.  2. Urinary symptoms Patient reports a *** h/o *** .  *** urinary frequency, urgency, hematuria, fevers, chills, abdominal pain, nausea, vomiting, back pain, vaginal discharge.  Patient has used *** for symptoms.  Patient *** a h/o frequent or recurrent UTIs.      ROS: Per HPI  Allergies  Allergen Reactions  . Fenofibrate Micronized Other (See Comments)    Caused increased LFT's Caused increased LFT's  . Codeine   . Hydrocodone Nausea And Vomiting and Other (See Comments)    Makes body hot.   . Hydrocodone-Acetaminophen Nausea And Vomiting   Past Medical History:  Diagnosis Date  . Arthritis   . Autoimmune hepatitis ([place])   . Chest pain    Stress echo normal, October, 2012  . Dyslipidemia   . Ejection fraction    EF 55-60%, echo, October, 2012  . GERD (gastroesophageal reflux disease)     Current Outpatient Medications:  .  atorvastatin (LIPITOR) 10 MG tablet, , Disp: , Rfl: 10 .  blood glucose meter kit and supplies KIT, Dispense based on patient and insurance preference. Use up to four times daily as directed. (FOR ICD-9 250.00, 250.01). E11.9, Disp: 1 each, Rfl: 0 .  fluticasone (FLONASE) 50 MCG/ACT nasal spray, Place 2 sprays into both nostrils daily. Sinus Allergies, Disp: 16 g, Rfl: 11 .  ibuprofen (ADVIL,MOTRIN) 200 MG tablet, Take 200 mg by mouth every 6 (six) hours as needed.  , Disp: , Rfl:  .  magnesium 30 MG tablet, Take 30 mg by mouth 2 (two) times daily., Disp: , Rfl:  .  mercaptopurine  (PURINETHOL) 50 MG tablet, Take 50 mg by mouth daily. Give on an empty stomach 1 hour before or 2 hours after meals. Caution: Chemotherapy., Disp: , Rfl:  .  metFORMIN (GLUCOPHAGE) 500 MG tablet, Take 1 tablet (500 mg total) by mouth 2 (two) times daily with a meal., Disp: 180 tablet, Rfl: 3 .  pantoprazole (PROTONIX) 40 MG tablet, Take 1 tablet (40 mg total) by mouth daily., Disp: 30 tablet, Rfl: 11 .  vitamin B-12 (CYANOCOBALAMIN) 1000 MCG tablet, Take 1,000 mcg by mouth daily., Disp: , Rfl:  .  vitamin E 1000 UNIT capsule, Take 1,000 Units by mouth daily., Disp: , Rfl:  Social History   Socioeconomic History  . Marital status: Married    Spouse name: Not on file  . Number of children: Not on file  . Years of education: Not on file  . Highest education level: Not on file  Social Needs  . Financial resource strain: Not on file  . Food insecurity - worry: Not on file  . Food insecurity - inability: Not on file  . Transportation needs - medical: Not on file  . Transportation needs - non-medical: Not on file  Occupational History  . Not on file  Tobacco Use  . Smoking status: Former Smoker    Packs/day: 1.00  . Smokeless tobacco: Never Used  Substance and Sexual Activity  . Alcohol use: Yes    Alcohol/week: 0.6 oz  Types: 1 Glasses of wine per week  . Drug use: No  . Sexual activity: Yes    Birth control/protection: Surgical  Other Topics Concern  . Not on file  Social History Narrative  . Not on file   Family History  Problem Relation Age of Onset  . Cancer Mother   . Cancer Father     Objective: Office vital signs reviewed. There were no vitals taken for this visit.  Physical Examination:  General: Awake, alert, *** nourished, No acute distress HEENT: Normal    Neck: No masses palpated. No lymphadenopathy    Ears: Tympanic membranes intact, normal light reflex, no erythema, no bulging    Eyes: PERRLA, extraocular membranes intact, sclera ***    Nose: nasal  turbinates moist, *** nasal discharge    Throat: moist mucus membranes, no erythema, *** tonsillar exudate.  Airway is patent Cardio: regular rate and rhythm, S1S2 heard, no murmurs appreciated Pulm: clear to auscultation bilaterally, no wheezes, rhonchi or rales; normal work of breathing on room air GI: soft, non-tender, non-distended, bowel sounds present x4, no hepatomegaly, no splenomegaly, no masses GU: external vaginal tissue ***, cervix ***, *** punctate lesions on cervix appreciated, *** discharge from cervical os, *** bleeding, *** cervical motion tenderness, *** abdominal/ adnexal masses Extremities: warm, well perfused, No edema, cyanosis or clubbing; +*** pulses bilaterally MSK: *** gait and *** station Skin: dry; intact; no rashes or lesions Neuro: *** Strength and light touch sensation grossly intact, *** DTRs ***/4  Assessment/ Plan: 62 y.o. female   ***  No orders of the defined types were placed in this encounter.  No orders of the defined types were placed in this encounter.    [name], DO [facility] [phone]

## 2017-05-31 LAB — URINE CULTURE

## 2017-06-03 ENCOUNTER — Other Ambulatory Visit: Payer: Self-pay | Admitting: Physician Assistant

## 2017-06-03 DIAGNOSIS — K219 Gastro-esophageal reflux disease without esophagitis: Secondary | ICD-10-CM

## 2017-06-20 ENCOUNTER — Other Ambulatory Visit: Payer: Self-pay | Admitting: Physician Assistant

## 2017-07-19 DIAGNOSIS — K754 Autoimmune hepatitis: Secondary | ICD-10-CM | POA: Diagnosis not present

## 2017-07-19 DIAGNOSIS — Z6838 Body mass index (BMI) 38.0-38.9, adult: Secondary | ICD-10-CM | POA: Diagnosis not present

## 2017-07-19 DIAGNOSIS — K7581 Nonalcoholic steatohepatitis (NASH): Secondary | ICD-10-CM | POA: Diagnosis not present

## 2017-08-23 ENCOUNTER — Encounter: Payer: Self-pay | Admitting: Physician Assistant

## 2017-08-23 ENCOUNTER — Ambulatory Visit (INDEPENDENT_AMBULATORY_CARE_PROVIDER_SITE_OTHER): Payer: BLUE CROSS/BLUE SHIELD | Admitting: Physician Assistant

## 2017-08-23 VITALS — BP 153/88 | HR 65 | Ht 65.0 in | Wt 231.4 lb

## 2017-08-23 DIAGNOSIS — I10 Essential (primary) hypertension: Secondary | ICD-10-CM | POA: Diagnosis not present

## 2017-08-23 DIAGNOSIS — E119 Type 2 diabetes mellitus without complications: Secondary | ICD-10-CM

## 2017-08-23 LAB — CMP14+EGFR
ALT: 153 IU/L — AB (ref 0–32)
AST: 78 IU/L — ABNORMAL HIGH (ref 0–40)
Albumin/Globulin Ratio: 1.3 (ref 1.2–2.2)
Albumin: 4 g/dL (ref 3.6–4.8)
Alkaline Phosphatase: 74 IU/L (ref 39–117)
BILIRUBIN TOTAL: 0.5 mg/dL (ref 0.0–1.2)
BUN/Creatinine Ratio: 17 (ref 12–28)
BUN: 14 mg/dL (ref 8–27)
CHLORIDE: 99 mmol/L (ref 96–106)
CO2: 22 mmol/L (ref 20–29)
Calcium: 9.3 mg/dL (ref 8.7–10.3)
Creatinine, Ser: 0.84 mg/dL (ref 0.57–1.00)
GFR calc Af Amer: 87 mL/min/{1.73_m2} (ref >59)
GFR calc non Af Amer: 75 mL/min/{1.73_m2} (ref >59)
Globulin, Total: 3.1 g/dL (ref 1.5–4.5)
Glucose: 174 mg/dL — ABNORMAL HIGH (ref 65–99)
Potassium: 4.4 mmol/L (ref 3.5–5.2)
Sodium: 136 mmol/L (ref 134–144)
TOTAL PROTEIN: 7.1 g/dL (ref 6.0–8.5)

## 2017-08-23 LAB — BAYER DCA HB A1C WAIVED: HB A1C: 7.6 % — AB (ref <7.0)

## 2017-08-23 MED ORDER — LISINOPRIL 5 MG PO TABS: 5.0000 mg | ORAL_TABLET | Freq: Every day | ORAL | 3 refills | Status: DC

## 2017-08-23 MED ORDER — NYSTATIN 100000 UNIT/ML MT SUSP
5.0000 mL | Freq: Four times a day (QID) | OROMUCOSAL | 0 refills | Status: DC
Start: 2017-08-23 — End: 2018-10-12

## 2017-08-23 NOTE — Progress Notes (Signed)
BP (!) 153/88   Pulse 65   Ht '5\' 5"'  (1.651 m)   Wt 231 lb 6.4 oz (105 kg)   BMI 38.51 kg/m    Subjective:    Patient ID: Taylor Leach, female    DOB: 26-Mar-1956, 62 y.o.   MRN: [id number]  HPI: Taylor Leach is a 62 y.o. female presenting on 08/23/2017 for Hypertension; Thrush; and Diabetes  Patient comes in for recheck on her hypertension and diabetes.  She does need lab work today.  She states overall she is feeling fairly good.  She is still seeing her hepatitis a doctor at [facility] for her autoimmune hepatitis.  They did make a change of medication.  She does not have any other complaints at this time   Past Medical History:  Diagnosis Date  . Arthritis   . Autoimmune hepatitis ([place])   . Chest pain    Stress echo normal, October, 2012  . Dyslipidemia   . Ejection fraction    EF 55-60%, echo, October, 2012  . GERD (gastroesophageal reflux disease)    Relevant past medical, surgical, family and social history reviewed and updated as indicated. Interim medical history since our last visit reviewed. Allergies and medications reviewed and updated. DATA REVIEWED: CHART IN EPIC  Family History reviewed for pertinent findings.  Review of Systems  Constitutional: Negative.  Negative for activity change, fatigue and fever.  HENT: Negative.   Eyes: Negative.   Respiratory: Negative.  Negative for cough.   Cardiovascular: Negative.  Negative for chest pain.  Gastrointestinal: Negative.  Negative for abdominal pain.  Endocrine: Negative.   Genitourinary: Negative.  Negative for dysuria.  Musculoskeletal: Negative.   Skin: Negative.   Neurological: Negative.     Allergies as of 08/23/2017      Reactions   Fenofibrate Micronized Other (See Comments)   Caused increased LFT's Caused increased LFT's   Codeine    Hydrocodone Nausea And Vomiting, Other (See Comments)   Makes body hot.    Hydrocodone-acetaminophen Nausea And Vomiting      Medication List        Accurate as of  08/23/17 12:31 PM. Always use your most recent med list.          atorvastatin 10 MG tablet Commonly known as:  LIPITOR   blood glucose meter kit and supplies Kit Dispense based on patient and insurance preference. Use up to four times daily as directed. (FOR ICD-9 250.00, 250.01). E11.9   CONTOUR NEXT TEST test strip Generic drug:  glucose blood CHECK BLOOD SUGAR UP TO 4 TIMES A DAY OR AS DIRECTED   fluticasone 50 MCG/ACT nasal spray Commonly known as:  FLONASE Place 2 sprays into both nostrils daily. Sinus Allergies   ibuprofen 200 MG tablet Commonly known as:  ADVIL,MOTRIN Take 200 mg by mouth every 6 (six) hours as needed.   lisinopril 5 MG tablet Commonly known as:  PRINIVIL,ZESTRIL Take 1 tablet (5 mg total) by mouth daily.   magnesium 30 MG tablet Take 30 mg by mouth 2 (two) times daily.   mercaptopurine 50 MG tablet Commonly known as:  PURINETHOL Take 1.5 tablets PO daily   metFORMIN 500 MG tablet Commonly known as:  GLUCOPHAGE Take 1 tablet (500 mg total) by mouth 2 (two) times daily with a meal.   naproxen 500 MG tablet Commonly known as:  NAPROSYN Take 1 tablet (500 mg total) by mouth 2 (two) times daily with a meal.   nystatin 100000 UNIT/ML suspension  Commonly known as:  MYCOSTATIN Take 5 mLs (500,000 Units total) by mouth 4 (four) times daily.   pantoprazole 40 MG tablet Commonly known as:  PROTONIX TAKE 1 TABLET DAILY   vitamin B-12 1000 MCG tablet Commonly known as:  CYANOCOBALAMIN Take 1,000 mcg by mouth daily.   vitamin E 1000 UNIT capsule Take 1,000 Units by mouth daily.          Objective:    BP (!) 153/88   Pulse 65   Ht '5\' 5"'  (1.651 m)   Wt 231 lb 6.4 oz (105 kg)   BMI 38.51 kg/m   Allergies  Allergen Reactions  . Fenofibrate Micronized Other (See Comments)    Caused increased LFT's Caused increased LFT's  . Codeine   . Hydrocodone Nausea And Vomiting and Other (See Comments)    Makes body hot.   .  Hydrocodone-Acetaminophen Nausea And Vomiting    Wt Readings from Last 3 Encounters:  08/23/17 231 lb 6.4 oz (105 kg)  05/30/17 228 lb (103.4 kg)  05/16/17 230 lb 3.2 oz (104.4 kg)    Physical Exam  Constitutional: She is oriented to person, place, and time. She appears well-developed and well-nourished.  HENT:  Head: Normocephalic and atraumatic.  Eyes: Pupils are equal, round, and reactive to light. Conjunctivae and EOM are normal.  Cardiovascular: Normal rate, regular rhythm, normal heart sounds and intact distal pulses.  Pulmonary/Chest: Effort normal and breath sounds normal.  Abdominal: Soft. Bowel sounds are normal.  Neurological: She is alert and oriented to person, place, and time. She has normal reflexes.  Skin: Skin is warm and dry. No rash noted.  Psychiatric: She has a normal mood and affect. Her behavior is normal. Judgment and thought content normal.    Results for orders placed or performed in visit on 08/23/17  Bayer DCA Hb A1c Waived  Result Value Ref Range   HB A1C (BAYER DCA - WAIVED) 7.6 (H) <7.0 %      Assessment & Plan:   1. Essential hypertension - CMP14+EGFR  2. Diabetes mellitus without complication ([place]) - IEP32+RJJO - Bayer DCA Hb A1c Waived   Continue all other maintenance medications as listed above.  Follow up plan: Return in about 3 months (around 11/23/2017) for recheck.  Educational handout given for [employer] PA-C [place] [address]  [place], [place] [address] [phone]   08/23/2017, 12:31 PM

## 2017-08-23 NOTE — Patient Instructions (Signed)
In a few days you may receive a survey in the mail or online from [employer] regarding your visit with us today. Please take a moment to fill this out. Your feedback is very important to our whole office. It can help us better understand your needs as well as improve your experience and satisfaction. Thank you for taking your time to complete it. We care about you.  Reika Callanan, PA-C  

## 2017-11-22 ENCOUNTER — Other Ambulatory Visit: Payer: Self-pay | Admitting: Physician Assistant

## 2017-11-22 DIAGNOSIS — E785 Hyperlipidemia, unspecified: Principal | ICD-10-CM

## 2017-11-22 DIAGNOSIS — E1169 Type 2 diabetes mellitus with other specified complication: Secondary | ICD-10-CM

## 2017-11-30 ENCOUNTER — Encounter: Payer: Self-pay | Admitting: Physician Assistant

## 2017-11-30 ENCOUNTER — Ambulatory Visit (INDEPENDENT_AMBULATORY_CARE_PROVIDER_SITE_OTHER): Payer: BLUE CROSS/BLUE SHIELD

## 2017-11-30 ENCOUNTER — Ambulatory Visit (INDEPENDENT_AMBULATORY_CARE_PROVIDER_SITE_OTHER): Payer: BLUE CROSS/BLUE SHIELD | Admitting: Physician Assistant

## 2017-11-30 VITALS — BP 125/73 | HR 72 | Temp 98.2°F | Ht 65.0 in | Wt 223.4 lb

## 2017-11-30 DIAGNOSIS — Z23 Encounter for immunization: Secondary | ICD-10-CM

## 2017-11-30 DIAGNOSIS — E041 Nontoxic single thyroid nodule: Secondary | ICD-10-CM

## 2017-11-30 DIAGNOSIS — I10 Essential (primary) hypertension: Secondary | ICD-10-CM

## 2017-11-30 DIAGNOSIS — E1169 Type 2 diabetes mellitus with other specified complication: Secondary | ICD-10-CM

## 2017-11-30 DIAGNOSIS — M25551 Pain in right hip: Secondary | ICD-10-CM | POA: Diagnosis not present

## 2017-11-30 DIAGNOSIS — E785 Hyperlipidemia, unspecified: Secondary | ICD-10-CM

## 2017-11-30 DIAGNOSIS — K754 Autoimmune hepatitis: Secondary | ICD-10-CM | POA: Diagnosis not present

## 2017-11-30 DIAGNOSIS — E119 Type 2 diabetes mellitus without complications: Secondary | ICD-10-CM | POA: Diagnosis not present

## 2017-11-30 LAB — BAYER DCA HB A1C WAIVED: HB A1C (BAYER DCA - WAIVED): 7.3 % — ABNORMAL HIGH (ref <7.0)

## 2017-11-30 MED ORDER — PROMETHAZINE HCL 25 MG PO TABS: 25.0000 mg | ORAL_TABLET | Freq: Three times a day (TID) | ORAL | 0 refills | Status: DC | PRN

## 2017-11-30 MED ORDER — ATORVASTATIN CALCIUM 10 MG PO TABS: 10.0000 mg | ORAL_TABLET | Freq: Every day | ORAL | 11 refills | Status: DC

## 2017-12-01 LAB — LIPID PANEL
CHOL/HDL RATIO: 3.6 ratio (ref 0.0–4.4)
Cholesterol, Total: 188 mg/dL (ref 100–199)
HDL: 52 mg/dL (ref >39)
LDL Calculated: 113 mg/dL — ABNORMAL HIGH (ref 0–99)
Triglycerides: 115 mg/dL (ref 0–149)
VLDL Cholesterol Cal: 23 mg/dL (ref 5–40)

## 2017-12-01 LAB — CBC WITH DIFFERENTIAL/PLATELET
Basophils Absolute: 0 10*3/uL (ref 0.0–0.2)
Basos: 1 %
EOS (ABSOLUTE): 0.1 10*3/uL (ref 0.0–0.4)
EOS: 4 %
Hematocrit: 40.8 % (ref 34.0–46.6)
Hemoglobin: 14.9 g/dL (ref 11.1–15.9)
IMMATURE GRANULOCYTES: 0 %
Immature Grans (Abs): 0 10*3/uL (ref 0.0–0.1)
Lymphocytes Absolute: 1.2 10*3/uL (ref 0.7–3.1)
Lymphs: 31 %
MCH: 35.6 pg — ABNORMAL HIGH (ref 26.6–33.0)
MCHC: 36.5 g/dL — ABNORMAL HIGH (ref 31.5–35.7)
MCV: 97 fL (ref 79–97)
MONOS ABS: 0.6 10*3/uL (ref 0.1–0.9)
Monocytes: 17 %
NEUTROS PCT: 47 %
Neutrophils Absolute: 1.7 10*3/uL (ref 1.4–7.0)
PLATELETS: 226 10*3/uL (ref 150–450)
RBC: 4.19 x10E6/uL (ref 3.77–5.28)
RDW: 13.6 % (ref 12.3–15.4)
WBC: 3.7 10*3/uL (ref 3.4–10.8)

## 2017-12-01 LAB — CMP14+EGFR
ALT: 530 IU/L (ref 0–32)
AST: 239 IU/L — ABNORMAL HIGH (ref 0–40)
Albumin/Globulin Ratio: 1.2 (ref 1.2–2.2)
Albumin: 4.2 g/dL (ref 3.6–4.8)
Alkaline Phosphatase: 80 IU/L (ref 39–117)
BUN/Creatinine Ratio: 16 (ref 12–28)
BUN: 14 mg/dL (ref 8–27)
Bilirubin Total: 0.4 mg/dL (ref 0.0–1.2)
CO2: 22 mmol/L (ref 20–29)
CREATININE: 0.9 mg/dL (ref 0.57–1.00)
Calcium: 9.3 mg/dL (ref 8.7–10.3)
Chloride: 104 mmol/L (ref 96–106)
GFR calc Af Amer: 80 mL/min/{1.73_m2} (ref >59)
GFR calc non Af Amer: 69 mL/min/{1.73_m2} (ref >59)
GLOBULIN, TOTAL: 3.4 g/dL (ref 1.5–4.5)
Glucose: 133 mg/dL — ABNORMAL HIGH (ref 65–99)
Potassium: 4.5 mmol/L (ref 3.5–5.2)
SODIUM: 139 mmol/L (ref 134–144)
Total Protein: 7.6 g/dL (ref 6.0–8.5)

## 2017-12-01 LAB — TSH: TSH: 2.49 u[IU]/mL (ref 0.450–4.500)

## 2017-12-04 NOTE — Progress Notes (Signed)
BP 125/73   Pulse 72   Temp 98.2 F (36.8 C) (Oral)   Ht 5' 5" (1.651 m)   Wt 223 lb 6.4 oz (101.3 kg)   BMI 37.18 kg/m    Subjective:    Patient ID: Taylor Leach, female    DOB: [birth year], 62 y.o.   MRN: [id number]  HPI: Taylor Leach is a 62 y.o. female presenting on 11/30/2017 for Hypertension (3 month follow up ); Diabetes; and Hyperlipidemia  This patient comes in for multiple complaints.  She has chronic medical conditions including hypertension, diabetes, autoimmune hepatitis, hyperlipidemia, joint pain, thyroid nodule.  We will draw labs today.  She will be seeing her hepatologist at [facility] in a few months.  She does not know if she is having difficulty with her hepatitis or not.  She is feeling well overall she does need several labs drawn and she does need her thyroid ultrasound performed.  Past Medical History:  Diagnosis Date  . Arthritis   . Autoimmune hepatitis ([place])   . Chest pain    Stress echo normal, October, 2012  . Dyslipidemia   . Ejection fraction    EF 55-60%, echo, October, 2012  . GERD (gastroesophageal reflux disease)    Relevant past medical, surgical, family and social history reviewed and updated as indicated. Interim medical history since our last visit reviewed. Allergies and medications reviewed and updated. DATA REVIEWED: CHART IN EPIC  Family History reviewed for pertinent findings.  Review of Systems  Constitutional: Positive for fatigue.  HENT: Negative.   Eyes: Negative.   Respiratory: Negative.   Gastrointestinal: Negative.   Genitourinary: Negative.   Musculoskeletal: Positive for arthralgias.    Allergies as of 11/30/2017      Reactions   Fenofibrate Micronized Other (See Comments)   Caused increased LFT's Caused increased LFT's   Codeine    Hydrocodone Nausea And Vomiting, Other (See Comments)   Makes body hot.    Hydrocodone-acetaminophen Nausea And Vomiting      Medication List        Accurate as of 11/30/17 11:59 PM.  Always use your most recent med list.          atorvastatin 10 MG tablet Commonly known as:  LIPITOR Take 1 tablet (10 mg total) by mouth daily.   blood glucose meter kit and supplies Kit Dispense based on patient and insurance preference. Use up to four times daily as directed. (FOR ICD-9 250.00, 250.01). E11.9   CONTOUR NEXT TEST test strip Generic drug:  glucose blood CHECK BLOOD SUGAR UP TO 4 TIMES A DAY OR AS DIRECTED   fluticasone 50 MCG/ACT nasal spray Commonly known as:  FLONASE Place 2 sprays into both nostrils daily. Sinus Allergies   lisinopril 5 MG tablet Commonly known as:  PRINIVIL,ZESTRIL Take 1 tablet (5 mg total) by mouth daily.   magnesium 30 MG tablet Take 30 mg by mouth 2 (two) times daily.   mercaptopurine 50 MG tablet Commonly known as:  PURINETHOL Take 1.5 tablets PO daily   metFORMIN 500 MG tablet Commonly known as:  GLUCOPHAGE Take 1 tablet (500 mg total) by mouth 2 (two) times daily with a meal.   naproxen 500 MG tablet Commonly known as:  NAPROSYN TAKE  (1)  TABLET TWICE A DAY WITH MEALS (BREAKFAST AND SUPPER)   nystatin 100000 UNIT/ML suspension Commonly known as:  MYCOSTATIN Take 5 mLs (500,000 Units total) by mouth 4 (four) times daily.   pantoprazole 40  MG tablet Commonly known as:  PROTONIX TAKE 1 TABLET DAILY   promethazine 25 MG tablet Commonly known as:  PHENERGAN Take 1 tablet (25 mg total) by mouth every 8 (eight) hours as needed for nausea or vomiting.   vitamin B-12 1000 MCG tablet Commonly known as:  CYANOCOBALAMIN Take 1,000 mcg by mouth daily.   vitamin E 1000 UNIT capsule Take 1,000 Units by mouth daily.          Objective:    BP 125/73   Pulse 72   Temp 98.2 F (36.8 C) (Oral)   Ht 5' 5" (1.651 m)   Wt 223 lb 6.4 oz (101.3 kg)   BMI 37.18 kg/m   Allergies  Allergen Reactions  . Fenofibrate Micronized Other (See Comments)    Caused increased LFT's Caused increased LFT's  . Codeine   . Hydrocodone  Nausea And Vomiting and Other (See Comments)    Makes body hot.   . Hydrocodone-Acetaminophen Nausea And Vomiting    Wt Readings from Last 3 Encounters:  11/30/17 223 lb 6.4 oz (101.3 kg)  08/23/17 231 lb 6.4 oz (105 kg)  05/30/17 228 lb (103.4 kg)    Physical Exam  Constitutional: She is oriented to person, place, and time. She appears well-developed and well-nourished.  HENT:  Head: Normocephalic and atraumatic.  Eyes: Pupils are equal, round, and reactive to light. Conjunctivae and EOM are normal.  Cardiovascular: Normal rate, regular rhythm, normal heart sounds and intact distal pulses.  Pulmonary/Chest: Effort normal and breath sounds normal.  Abdominal: Soft. Bowel sounds are normal.  Neurological: She is alert and oriented to person, place, and time. She has normal reflexes.  Skin: Skin is warm and dry. No rash noted.  Psychiatric: She has a normal mood and affect. Her behavior is normal. Judgment and thought content normal.    Results for orders placed or performed in visit on 11/30/17  CMP14+EGFR  Result Value Ref Range   Glucose 133 (H) 65 - 99 mg/dL   BUN 14 8 - 27 mg/dL   Creatinine, Ser 0.90 0.57 - 1.00 mg/dL   GFR calc non Af Amer 69 >59 mL/min/1.73   GFR calc Af Amer 80 >59 mL/min/1.73   BUN/Creatinine Ratio 16 12 - 28   Sodium 139 134 - 144 mmol/L   Potassium 4.5 3.5 - 5.2 mmol/L   Chloride 104 96 - 106 mmol/L   CO2 22 20 - 29 mmol/L   Calcium 9.3 8.7 - 10.3 mg/dL   Total Protein 7.6 6.0 - 8.5 g/dL   Albumin 4.2 3.6 - 4.8 g/dL   Globulin, Total 3.4 1.5 - 4.5 g/dL   Albumin/Globulin Ratio 1.2 1.2 - 2.2   Bilirubin Total 0.4 0.0 - 1.2 mg/dL   Alkaline Phosphatase 80 39 - 117 IU/L   AST 239 (H) 0 - 40 IU/L   ALT 530 (HH) 0 - 32 IU/L  CBC with Differential/Platelet  Result Value Ref Range   WBC 3.7 3.4 - 10.8 x10E3/uL   RBC 4.19 3.77 - 5.28 x10E6/uL   Hemoglobin 14.9 11.1 - 15.9 g/dL   Hematocrit 40.8 34.0 - 46.6 %   MCV 97 79 - 97 fL   MCH 35.6 (H)  26.6 - 33.0 pg   MCHC 36.5 (H) 31.5 - 35.7 g/dL   RDW 13.6 12.3 - 15.4 %   Platelets 226 150 - 450 x10E3/uL   Neutrophils 47 Not Estab. %   Lymphs 31 Not Estab. %   Monocytes 17 Not  Estab. %   Eos 4 Not Estab. %   Basos 1 Not Estab. %   Neutrophils Absolute 1.7 1.4 - 7.0 x10E3/uL   Lymphocytes Absolute 1.2 0.7 - 3.1 x10E3/uL   Monocytes Absolute 0.6 0.1 - 0.9 x10E3/uL   EOS (ABSOLUTE) 0.1 0.0 - 0.4 x10E3/uL   Basophils Absolute 0.0 0.0 - 0.2 x10E3/uL   Immature Granulocytes 0 Not Estab. %   Immature Grans (Abs) 0.0 0.0 - 0.1 x10E3/uL   Hematology Comments: Note:   Lipid panel  Result Value Ref Range   Cholesterol, Total 188 100 - 199 mg/dL   Triglycerides 115 0 - 149 mg/dL   HDL 52 >39 mg/dL   VLDL Cholesterol Cal 23 5 - 40 mg/dL   LDL Calculated 113 (H) 0 - 99 mg/dL   Chol/HDL Ratio 3.6 0.0 - 4.4 ratio  TSH  Result Value Ref Range   TSH 2.490 0.450 - 4.500 uIU/mL  Bayer DCA Hb A1c Waived  Result Value Ref Range   HB A1C (BAYER DCA - WAIVED) 7.3 (H) <7.0 %      Assessment & Plan:   1. Essential hypertension - CMP14+EGFR - CBC with Differential/Platelet - Lipid panel - TSH  2. Diabetes mellitus without complication ([place]) - CBC with Differential/Platelet - Lipid panel - TSH - Bayer DCA Hb A1c Waived  3. Autoimmune hepatitis ([place] Leach) - CMP14+EGFR - CBC with Differential/Platelet - Lipid panel  4. Hyperlipidemia associated with type 2 diabetes mellitus (H[facility]) - atorvastatin (LIPITOR) 10 MG tablet; Take 1 tablet (10 mg total) by mouth daily.  Dispense: 30 tablet; Refill: 11  5. Pain of right hip joint pain - DG HIP UNILAT W OR W/O PELVIS 2-3 VIEWS RIGHT; Future  6. Thyroid nodule - US THYROID; Future   Continue all other maintenance medications as listed above.  Follow up plan: No follow-ups on file.  Educational handout given for [place] PA-C [place] [address]  [place], [place]  [address] [phone]   12/04/2017, 10:17 PM

## 2017-12-08 ENCOUNTER — Ambulatory Visit (HOSPITAL_COMMUNITY)
Admission: RE | Admit: 2017-12-08 | Discharge: 2017-12-08 | Disposition: A | Discharge status: Home / Self Care | Payer: BLUE CROSS/BLUE SHIELD | Source: Ambulatory Visit | Attending: Physician Assistant | Admitting: Physician Assistant

## 2017-12-08 DIAGNOSIS — E042 Nontoxic multinodular goiter: Secondary | ICD-10-CM | POA: Diagnosis not present

## 2017-12-08 DIAGNOSIS — E041 Nontoxic single thyroid nodule: Secondary | ICD-10-CM | POA: Diagnosis not present

## 2017-12-21 ENCOUNTER — Other Ambulatory Visit: Payer: BLUE CROSS/BLUE SHIELD

## 2017-12-21 DIAGNOSIS — R945 Abnormal results of liver function studies: Principal | ICD-10-CM

## 2017-12-21 DIAGNOSIS — R7989 Other specified abnormal findings of blood chemistry: Secondary | ICD-10-CM

## 2017-12-22 ENCOUNTER — Other Ambulatory Visit: Payer: Self-pay | Admitting: Physician Assistant

## 2017-12-22 DIAGNOSIS — E1169 Type 2 diabetes mellitus with other specified complication: Secondary | ICD-10-CM

## 2017-12-22 DIAGNOSIS — E785 Hyperlipidemia, unspecified: Principal | ICD-10-CM

## 2017-12-22 LAB — HEPATIC FUNCTION PANEL
ALT: 304 IU/L — ABNORMAL HIGH (ref 0–32)
AST: 188 IU/L — ABNORMAL HIGH (ref 0–40)
Albumin: 3.9 g/dL (ref 3.6–4.8)
Alkaline Phosphatase: 92 IU/L (ref 39–117)
BILIRUBIN TOTAL: 0.4 mg/dL (ref 0.0–1.2)
BILIRUBIN, DIRECT: 0.11 mg/dL (ref 0.00–0.40)
TOTAL PROTEIN: 7.1 g/dL (ref 6.0–8.5)

## 2017-12-22 LAB — BMP8+EGFR
BUN / CREAT RATIO: 17 (ref 12–28)
BUN: 14 mg/dL (ref 8–27)
CALCIUM: 9.4 mg/dL (ref 8.7–10.3)
CO2: 20 mmol/L (ref 20–29)
CREATININE: 0.84 mg/dL (ref 0.57–1.00)
Chloride: 103 mmol/L (ref 96–106)
GFR calc non Af Amer: 75 mL/min/{1.73_m2} (ref >59)
GFR, EST AFRICAN AMERICAN: 87 mL/min/{1.73_m2} (ref >59)
Glucose: 138 mg/dL — ABNORMAL HIGH (ref 65–99)
Potassium: 4.7 mmol/L (ref 3.5–5.2)
Sodium: 141 mmol/L (ref 134–144)

## 2017-12-26 ENCOUNTER — Ambulatory Visit: Payer: BLUE CROSS/BLUE SHIELD | Admitting: Physician Assistant

## 2018-01-04 ENCOUNTER — Telehealth: Payer: Self-pay | Admitting: *Deleted

## 2018-01-04 MED ORDER — AMOXICILLIN 500 MG PO CAPS: 1000.0000 mg | ORAL_CAPSULE | Freq: Two times a day (BID) | ORAL | 0 refills | Status: DC

## 2018-01-04 NOTE — Telephone Encounter (Signed)
Telephone call from [place] please send in Amoxicillin 500mg  2 bid for 10 days. Rx sent to pharmacy for patient. Patient aware.

## 2018-01-04 NOTE — Telephone Encounter (Signed)
Patient has a sinus infection and would like antibiotic sent to pharmacy please.

## 2018-01-05 NOTE — Telephone Encounter (Signed)
noted 

## 2018-01-12 ENCOUNTER — Ambulatory Visit: Payer: BLUE CROSS/BLUE SHIELD | Admitting: Physician Assistant

## 2018-01-24 DIAGNOSIS — K754 Autoimmune hepatitis: Secondary | ICD-10-CM | POA: Diagnosis not present

## 2018-01-24 DIAGNOSIS — Z6837 Body mass index (BMI) 37.0-37.9, adult: Secondary | ICD-10-CM | POA: Diagnosis not present

## 2018-02-13 DIAGNOSIS — R748 Abnormal levels of other serum enzymes: Secondary | ICD-10-CM | POA: Diagnosis not present

## 2018-02-13 DIAGNOSIS — K760 Fatty (change of) liver, not elsewhere classified: Secondary | ICD-10-CM | POA: Diagnosis not present

## 2018-02-13 DIAGNOSIS — K754 Autoimmune hepatitis: Secondary | ICD-10-CM | POA: Diagnosis not present

## 2018-02-13 DIAGNOSIS — K7581 Nonalcoholic steatohepatitis (NASH): Secondary | ICD-10-CM | POA: Diagnosis not present

## 2018-02-24 DIAGNOSIS — Z1231 Encounter for screening mammogram for malignant neoplasm of breast: Secondary | ICD-10-CM | POA: Diagnosis not present

## 2018-02-24 LAB — HM MAMMOGRAPHY

## 2018-03-30 ENCOUNTER — Other Ambulatory Visit: Payer: BLUE CROSS/BLUE SHIELD

## 2018-03-30 DIAGNOSIS — K754 Autoimmune hepatitis: Secondary | ICD-10-CM

## 2018-03-30 LAB — CMP14+EGFR
ALK PHOS: 108 IU/L (ref 39–117)
ALT: 319 IU/L — ABNORMAL HIGH (ref 0–32)
AST: 271 IU/L — AB (ref 0–40)
Albumin/Globulin Ratio: 0.9 — ABNORMAL LOW (ref 1.2–2.2)
Albumin: 3.7 g/dL (ref 3.6–4.8)
BUN/Creatinine Ratio: 11 — ABNORMAL LOW (ref 12–28)
BUN: 11 mg/dL (ref 8–27)
Bilirubin Total: 0.5 mg/dL (ref 0.0–1.2)
CALCIUM: 9.5 mg/dL (ref 8.7–10.3)
CO2: 23 mmol/L (ref 20–29)
Chloride: 98 mmol/L (ref 96–106)
Creatinine, Ser: 0.98 mg/dL (ref 0.57–1.00)
GFR calc Af Amer: 72 mL/min/{1.73_m2} (ref >59)
GFR, EST NON AFRICAN AMERICAN: 62 mL/min/{1.73_m2} (ref >59)
Globulin, Total: 4.2 g/dL (ref 1.5–4.5)
Glucose: 169 mg/dL — ABNORMAL HIGH (ref 65–99)
Potassium: 4.1 mmol/L (ref 3.5–5.2)
Sodium: 137 mmol/L (ref 134–144)
Total Protein: 7.9 g/dL (ref 6.0–8.5)

## 2018-05-04 ENCOUNTER — Ambulatory Visit (INDEPENDENT_AMBULATORY_CARE_PROVIDER_SITE_OTHER): Payer: BLUE CROSS/BLUE SHIELD | Admitting: Physician Assistant

## 2018-05-04 ENCOUNTER — Encounter: Payer: Self-pay | Admitting: Physician Assistant

## 2018-05-04 VITALS — BP 131/79 | HR 77 | Ht 65.0 in | Wt 212.6 lb

## 2018-05-04 DIAGNOSIS — E119 Type 2 diabetes mellitus without complications: Secondary | ICD-10-CM | POA: Diagnosis not present

## 2018-05-04 DIAGNOSIS — K754 Autoimmune hepatitis: Secondary | ICD-10-CM | POA: Diagnosis not present

## 2018-05-04 LAB — BAYER DCA HB A1C WAIVED: HB A1C (BAYER DCA - WAIVED): 7.6 % — ABNORMAL HIGH (ref <7.0)

## 2018-05-05 LAB — CMP14+EGFR
A/G RATIO: 0.9 — AB (ref 1.2–2.2)
ALT: 262 IU/L — ABNORMAL HIGH (ref 0–32)
AST: 215 IU/L — ABNORMAL HIGH (ref 0–40)
Albumin: 3.8 g/dL (ref 3.8–4.8)
Alkaline Phosphatase: 108 IU/L (ref 39–117)
BUN/Creatinine Ratio: 14 (ref 12–28)
BUN: 14 mg/dL (ref 8–27)
Bilirubin Total: 0.5 mg/dL (ref 0.0–1.2)
CO2: 21 mmol/L (ref 20–29)
Calcium: 9.3 mg/dL (ref 8.7–10.3)
Chloride: 102 mmol/L (ref 96–106)
Creatinine, Ser: 0.97 mg/dL (ref 0.57–1.00)
GFR calc non Af Amer: 63 mL/min/{1.73_m2} (ref >59)
GFR, EST AFRICAN AMERICAN: 73 mL/min/{1.73_m2} (ref >59)
GLOBULIN, TOTAL: 4.4 g/dL (ref 1.5–4.5)
Glucose: 132 mg/dL — ABNORMAL HIGH (ref 65–99)
POTASSIUM: 4.3 mmol/L (ref 3.5–5.2)
Sodium: 139 mmol/L (ref 134–144)
Total Protein: 8.2 g/dL (ref 6.0–8.5)

## 2018-05-05 LAB — CBC WITH DIFFERENTIAL/PLATELET
Basophils Absolute: 0 10*3/uL (ref 0.0–0.2)
Basos: 1 %
EOS (ABSOLUTE): 0.1 10*3/uL (ref 0.0–0.4)
Eos: 3 %
Hematocrit: 41.3 % (ref 34.0–46.6)
Hemoglobin: 14.7 g/dL (ref 11.1–15.9)
Immature Grans (Abs): 0 10*3/uL (ref 0.0–0.1)
Immature Granulocytes: 0 %
Lymphocytes Absolute: 1.2 10*3/uL (ref 0.7–3.1)
Lymphs: 28 %
MCH: 33.7 pg — ABNORMAL HIGH (ref 26.6–33.0)
MCHC: 35.6 g/dL (ref 31.5–35.7)
MCV: 95 fL (ref 79–97)
MONOS ABS: 0.6 10*3/uL (ref 0.1–0.9)
Monocytes: 14 %
Neutrophils Absolute: 2.2 10*3/uL (ref 1.4–7.0)
Neutrophils: 54 %
PLATELETS: 141 10*3/uL — AB (ref 150–450)
RBC: 4.36 x10E6/uL (ref 3.77–5.28)
RDW: 12.1 % (ref 11.7–15.4)
WBC: 4.2 10*3/uL (ref 3.4–10.8)

## 2018-05-05 LAB — LIPID PANEL
Chol/HDL Ratio: 3.5 ratio (ref 0.0–4.4)
Cholesterol, Total: 141 mg/dL (ref 100–199)
HDL: 40 mg/dL (ref >39)
LDL Calculated: 75 mg/dL (ref 0–99)
Triglycerides: 132 mg/dL (ref 0–149)
VLDL Cholesterol Cal: 26 mg/dL (ref 5–40)

## 2018-05-08 ENCOUNTER — Other Ambulatory Visit: Payer: Self-pay | Admitting: Physician Assistant

## 2018-05-08 DIAGNOSIS — J0141 Acute recurrent pansinusitis: Secondary | ICD-10-CM | POA: Diagnosis not present

## 2018-05-08 DIAGNOSIS — J3489 Other specified disorders of nose and nasal sinuses: Secondary | ICD-10-CM | POA: Diagnosis not present

## 2018-05-08 DIAGNOSIS — Z7289 Other problems related to lifestyle: Secondary | ICD-10-CM | POA: Diagnosis not present

## 2018-05-08 DIAGNOSIS — K219 Gastro-esophageal reflux disease without esophagitis: Secondary | ICD-10-CM

## 2018-05-08 DIAGNOSIS — Z87891 Personal history of nicotine dependence: Secondary | ICD-10-CM | POA: Diagnosis not present

## 2018-05-08 NOTE — Progress Notes (Signed)
BP 131/79   Pulse 77   Ht '5\' 5"'$  (1.651 m)   Wt 212 lb 9.6 oz (96.4 kg)   BMI 35.38 kg/m    Subjective:    Patient ID: Taylor Leach, female    DOB: 07/13/1955, 63 y.o.   MRN: [id number]  HPI: Taylor Leach is a 63 y.o. female presenting on 05/04/2018 for Hypertension (6 month); Diabetes; and Hyperlipidemia  This patient comes in for periodic recheck on medications and conditions including diabetes and hyperlipidemia.  Also autoimmune hepatitis.  She does need labs performed today she has lost 20 pounds or so with excellent vegetable-based dietary changes.  She is eating some fish and chicken but a significant amount of fruit and vegetables.  She states she is feeling quite good.  And is happy with her progress..   All medications are reviewed today. There are no reports of any problems with the medications. All of the medical conditions are reviewed and updated.  Lab work is reviewed and will be ordered as medically necessary. There are no new problems reported with today's visit. 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 Arthritis   . Autoimmune hepatitis ([place])   . Chest pain    Stress echo normal, October, 2012  . Dyslipidemia   . Ejection fraction    EF 55-60%, echo, October, 2012  . GERD (gastroesophageal reflux disease)    Relevant past medical, surgical, family and social history reviewed and updated as indicated. Interim medical history since our last visit reviewed. Allergies and medications reviewed and updated. DATA REVIEWED: CHART IN EPIC  Family History reviewed for pertinent findings.  Review of Systems  Constitutional: Negative.  Negative for activity change, fatigue and fever.  HENT: Negative.   Eyes: Negative.   Respiratory:  Negative.  Negative for cough.   Cardiovascular: Negative.  Negative for chest pain.  Gastrointestinal: Negative.  Negative for abdominal pain.  Endocrine: Negative.   Genitourinary: Negative.  Negative for dysuria.  Musculoskeletal: Negative.   Skin: Negative.   Neurological: Negative.     Allergies as of 05/04/2018      Reactions   Fenofibrate Micronized Other (See Comments)   Caused increased LFT's Caused increased LFT's   Codeine    Hydrocodone Nausea And Vomiting, Other (See Comments)   Makes body hot.    Hydrocodone-acetaminophen Nausea And Vomiting      Medication List       Accurate as of May 04, 2018 11:59 PM. Always use your most recent med list.        atorvastatin 10 MG tablet Commonly known as:  LIPITOR Take 1 tablet (10 mg total) by mouth daily.   blood glucose meter kit and supplies Kit Dispense based on patient and insurance preference. Use up to four times daily as directed. (FOR ICD-9 250.00, 250.01). E11.9   CONTOUR NEXT TEST test strip Generic drug:  glucose blood CHECK BLOOD SUGAR UP TO 4 TIMES A DAY OR AS DIRECTED   fluticasone 50 MCG/ACT nasal spray Commonly known as:  FLONASE Place 2 sprays into both nostrils daily. Sinus Allergies   lisinopril 5 MG tablet Commonly known as:  PRINIVIL,ZESTRIL Take 1 tablet (5 mg total) by mouth daily.  magnesium 30 MG tablet Take 30 mg by mouth 2 (two) times daily.   mercaptopurine 50 MG tablet Commonly known as:  PURINETHOL Take 1.5 tablets PO daily   metFORMIN 500 MG tablet Commonly known as:  GLUCOPHAGE TAKE  (1)  TABLET TWICE A DAY.   naproxen 500 MG tablet Commonly known as:  NAPROSYN TAKE  (1)  TABLET TWICE A DAY WITH MEALS (BREAKFAST AND SUPPER)   nystatin 100000 UNIT/ML suspension Commonly known as:  MYCOSTATIN Take 5 mLs (500,000 Units total) by mouth 4 (four) times daily.   pantoprazole 40 MG tablet Commonly known as:  PROTONIX TAKE 1 TABLET DAILY   promethazine 25 MG  tablet Commonly known as:  PHENERGAN Take 1 tablet (25 mg total) by mouth every 8 (eight) hours as needed for nausea or vomiting.   vitamin B-12 1000 MCG tablet Commonly known as:  CYANOCOBALAMIN Take 1,000 mcg by mouth daily.   vitamin E 1000 UNIT capsule Take 1,000 Units by mouth daily.          Objective:    BP 131/79   Pulse 77   Ht '5\' 5"'$  (1.651 m)   Wt 212 lb 9.6 oz (96.4 kg)   BMI 35.38 kg/m   Allergies  Allergen Reactions  . Fenofibrate Micronized Other (See Comments)    Caused increased LFT's Caused increased LFT's  . Codeine   . Hydrocodone Nausea And Vomiting and Other (See Comments)    Makes body hot.   . Hydrocodone-Acetaminophen Nausea And Vomiting    Wt Readings from Last 3 Encounters:  05/04/18 212 lb 9.6 oz (96.4 kg)  11/30/17 223 lb 6.4 oz (101.3 kg)  08/23/17 231 lb 6.4 oz (105 kg)    Physical Exam Constitutional:      Appearance: She is well-developed.  HENT:     Head: Normocephalic and atraumatic.  Eyes:     Conjunctiva/sclera: Conjunctivae normal.     Pupils: Pupils are equal, round, and reactive to light.  Cardiovascular:     Rate and Rhythm: Normal rate and regular rhythm.     Heart sounds: Normal heart sounds.  Pulmonary:     Effort: Pulmonary effort is normal.     Breath sounds: Normal breath sounds.  Abdominal:     General: Bowel sounds are normal.     Palpations: Abdomen is soft.  Skin:    General: Skin is warm and dry.     Findings: No rash.  Neurological:     Mental Status: She is alert and oriented to person, place, and time.     Deep Tendon Reflexes: Reflexes are normal and symmetric.  Psychiatric:        Behavior: Behavior normal.        Thought Content: Thought content normal.        Judgment: Judgment normal.     Results for orders placed or performed in visit on 05/04/18  CBC with Differential/Platelet  Result Value Ref Range   WBC 4.2 3.4 - 10.8 x10E3/uL   RBC 4.36 3.77 - 5.28 x10E6/uL   Hemoglobin 14.7 11.1  - 15.9 g/dL   Hematocrit 41.3 34.0 - 46.6 %   MCV 95 79 - 97 fL   MCH 33.7 (H) 26.6 - 33.0 pg   MCHC 35.6 31.5 - 35.7 g/dL   RDW 12.1 11.7 - 15.4 %   Platelets 141 (L) 150 - 450 x10E3/uL   Neutrophils 54 Not Estab. %   Lymphs 28 Not Estab. %   Monocytes 14  Not Estab. %   Eos 3 Not Estab. %   Basos 1 Not Estab. %   Neutrophils Absolute 2.2 1.4 - 7.0 x10E3/uL   Lymphocytes Absolute 1.2 0.7 - 3.1 x10E3/uL   Monocytes Absolute 0.6 0.1 - 0.9 x10E3/uL   EOS (ABSOLUTE) 0.1 0.0 - 0.4 x10E3/uL   Basophils Absolute 0.0 0.0 - 0.2 x10E3/uL   Immature Granulocytes 0 Not Estab. %   Immature Grans (Abs) 0.0 0.0 - 0.1 x10E3/uL  CMP14+EGFR  Result Value Ref Range   Glucose 132 (H) 65 - 99 mg/dL   BUN 14 8 - 27 mg/dL   Creatinine, Ser 0.97 0.57 - 1.00 mg/dL   GFR calc non Af Amer 63 >59 mL/min/1.73   GFR calc Af Amer 73 >59 mL/min/1.73   BUN/Creatinine Ratio 14 12 - 28   Sodium 139 134 - 144 mmol/L   Potassium 4.3 3.5 - 5.2 mmol/L   Chloride 102 96 - 106 mmol/L   CO2 21 20 - 29 mmol/L   Calcium 9.3 8.7 - 10.3 mg/dL   Total Protein 8.2 6.0 - 8.5 g/dL   Albumin 3.8 3.8 - 4.8 g/dL   Globulin, Total 4.4 1.5 - 4.5 g/dL   Albumin/Globulin Ratio 0.9 (L) 1.2 - 2.2   Bilirubin Total 0.5 0.0 - 1.2 mg/dL   Alkaline Phosphatase 108 39 - 117 IU/L   AST 215 (H) 0 - 40 IU/L   ALT 262 (H) 0 - 32 IU/L  Bayer DCA Hb A1c Waived  Result Value Ref Range   HB A1C (BAYER DCA - WAIVED) 7.6 (H) <7.0 %  Lipid panel  Result Value Ref Range   Cholesterol, Total 141 100 - 199 mg/dL   Triglycerides 132 0 - 149 mg/dL   HDL 40 >39 mg/dL   VLDL Cholesterol Cal 26 5 - 40 mg/dL   LDL Calculated 75 0 - 99 mg/dL   Chol/HDL Ratio 3.5 0.0 - 4.4 ratio      Assessment & Plan:   1. Diabetes mellitus without complication ([place]) - CBC with Differential/Platelet - CMP14+EGFR - Bayer DCA Hb A1c Waived - Lipid panel  2. AUTOIMMUNE HEPATITIS - CBC with Differential/Platelet - CMP14+EGFR - Bayer DCA Hb A1c Waived -  Lipid panel   Continue all other maintenance medications as listed above.  Follow up plan: No follow-ups on file.  Educational handout given for [place] PA-C [place] [address]  [place], [place] [address] [phone]   05/08/2018, 1:19 PM

## 2018-06-11 DIAGNOSIS — J0141 Acute recurrent pansinusitis: Secondary | ICD-10-CM | POA: Diagnosis not present

## 2018-06-11 DIAGNOSIS — J329 Chronic sinusitis, unspecified: Secondary | ICD-10-CM | POA: Diagnosis not present

## 2018-06-11 DIAGNOSIS — J310 Chronic rhinitis: Secondary | ICD-10-CM | POA: Diagnosis not present

## 2018-06-13 DIAGNOSIS — K7581 Nonalcoholic steatohepatitis (NASH): Secondary | ICD-10-CM | POA: Diagnosis not present

## 2018-06-13 DIAGNOSIS — K754 Autoimmune hepatitis: Secondary | ICD-10-CM | POA: Diagnosis not present

## 2018-06-13 DIAGNOSIS — Z6834 Body mass index (BMI) 34.0-34.9, adult: Secondary | ICD-10-CM | POA: Diagnosis not present

## 2018-07-07 ENCOUNTER — Other Ambulatory Visit: Payer: Self-pay | Admitting: Physician Assistant

## 2018-07-07 DIAGNOSIS — E1169 Type 2 diabetes mellitus with other specified complication: Secondary | ICD-10-CM

## 2018-07-07 DIAGNOSIS — E785 Hyperlipidemia, unspecified: Principal | ICD-10-CM

## 2018-07-31 ENCOUNTER — Ambulatory Visit: Payer: BLUE CROSS/BLUE SHIELD | Admitting: Physician Assistant

## 2018-08-08 ENCOUNTER — Ambulatory Visit (INDEPENDENT_AMBULATORY_CARE_PROVIDER_SITE_OTHER): Payer: BLUE CROSS/BLUE SHIELD | Admitting: Physician Assistant

## 2018-08-08 ENCOUNTER — Encounter: Payer: Self-pay | Admitting: Physician Assistant

## 2018-08-08 ENCOUNTER — Other Ambulatory Visit: Payer: Self-pay

## 2018-08-08 DIAGNOSIS — K754 Autoimmune hepatitis: Secondary | ICD-10-CM

## 2018-08-08 DIAGNOSIS — K760 Fatty (change of) liver, not elsewhere classified: Secondary | ICD-10-CM | POA: Diagnosis not present

## 2018-08-08 DIAGNOSIS — E119 Type 2 diabetes mellitus without complications: Secondary | ICD-10-CM | POA: Diagnosis not present

## 2018-08-08 DIAGNOSIS — E1169 Type 2 diabetes mellitus with other specified complication: Secondary | ICD-10-CM | POA: Diagnosis not present

## 2018-08-08 DIAGNOSIS — E785 Hyperlipidemia, unspecified: Secondary | ICD-10-CM

## 2018-08-08 DIAGNOSIS — E041 Nontoxic single thyroid nodule: Secondary | ICD-10-CM

## 2018-08-08 MED ORDER — METFORMIN HCL 500 MG PO TABS: ORAL_TABLET | ORAL | 11 refills | Status: DC

## 2018-08-08 NOTE — Progress Notes (Signed)
Telephone visit  Subjective: CC: Chronic recheck on chronic medical conditions PCP: Terald Sleeper, PA-C VEH:MCNO Taylor Leach is a 63 y.o. female calls for telephone consult today. Patient provides verbal consent for consult held via phone.  Patient is identified with 2 separate identifiers.  At this time the entire area is on COVID-19 social distancing and stay home orders are in place.  Patient is of higher risk and therefore we are performing this by a virtual method.  Location of patient: Home  Location of provider: HOME Others present for call: no  The patient states that she is currently out of work and alternating weeks of work for social distancing purposes.  She has been seeing her hepatologist at [facility].  They want her to start having monthly labs performed to monitor her liver functions and IgG.  They do feel that her autoimmune hepatitis is more of the problem than her Karlene Lineman.  She has been being on a plant-based diet and has greatly improved her liver functions.  However the IgG increased significantly back in March.  She is supposed to be starting budesonide tablet and CellCept.  She states that her glucose readings have been good.  She is doing a very good job at keeping her diabetes under control.  We will have labs performed soon.   ROS: Per HPI  Allergies  Allergen Reactions  . Fenofibrate Micronized Other (See Comments)    Caused increased LFT's Caused increased LFT's  . Codeine   . Hydrocodone Nausea And Vomiting and Other (See Comments)    Makes body hot.   . Hydrocodone-Acetaminophen Nausea And Vomiting   Past Medical History:  Diagnosis Date  . Arthritis   . Autoimmune hepatitis ([place])   . Chest pain    Stress echo normal, October, 2012  . Dyslipidemia   . Ejection fraction    EF 55-60%, echo, October, 2012  . GERD (gastroesophageal reflux disease)     Current Outpatient Medications:  .  atorvastatin (LIPITOR) 10 MG tablet, Take 1 tablet (10 mg  total) by mouth daily., Disp: 30 tablet, Rfl: 11 .  blood glucose meter kit and supplies KIT, Dispense based on patient and insurance preference. Use up to four times daily as directed. (FOR ICD-9 250.00, 250.01). E11.9, Disp: 1 each, Rfl: 0 .  budesonide (ENTOCORT EC) 3 MG 24 hr capsule, Take 3 mg by mouth 2 (two) times daily., Disp: , Rfl:  .  CONTOUR NEXT TEST test strip, CHECK BLOOD SUGAR UP TO 4 TIMES A DAY OR AS DIRECTED, Disp: 400 each, Rfl: 2 .  fluticasone (FLONASE) 50 MCG/ACT nasal spray, Place 2 sprays into both nostrils daily. Sinus Allergies, Disp: 16 g, Rfl: 11 .  lisinopril (PRINIVIL,ZESTRIL) 5 MG tablet, Take 1 tablet (5 mg total) by mouth daily., Disp: 90 tablet, Rfl: 3 .  magnesium 30 MG tablet, Take 30 mg by mouth 2 (two) times daily., Disp: , Rfl:  .  mercaptopurine (PURINETHOL) 50 MG tablet, Take 1.5 tablets PO daily, Disp: , Rfl:  .  metFORMIN (GLUCOPHAGE) 500 MG tablet, TAKE (1) TABLET TWICE A DAY., Disp: 60 tablet, Rfl: 11 .  mycophenolate (CELLCEPT) 500 MG tablet, Take 500 mg by mouth 2 (two) times daily., Disp: , Rfl:  .  naproxen (NAPROSYN) 500 MG tablet, TAKE  (1)  TABLET TWICE A DAY WITH MEALS (BREAKFAST AND SUPPER), Disp: 60 tablet, Rfl: 0 .  nystatin (MYCOSTATIN) 100000 UNIT/ML suspension, Take 5 mLs (500,000 Units total) by  mouth 4 (four) times daily., Disp: 60 mL, Rfl: 0 .  pantoprazole (PROTONIX) 40 MG tablet, TAKE 1 TABLET DAILY, Disp: 30 tablet, Rfl: 5 .  promethazine (PHENERGAN) 25 MG tablet, Take 1 tablet (25 mg total) by mouth every 8 (eight) hours as needed for nausea or vomiting., Disp: 20 tablet, Rfl: 0 .  vitamin B-12 (CYANOCOBALAMIN) 1000 MCG tablet, Take 1,000 mcg by mouth daily., Disp: , Rfl:  .  vitamin E 1000 UNIT capsule, Take 1,000 Units by mouth daily., Disp: , Rfl:   Assessment/ Plan: 63 y.o. female   1. Hyperlipidemia associated with type 2 diabetes mellitus (HCC) - metFORMIN (GLUCOPHAGE) 500 MG tablet; TAKE (1) TABLET TWICE A DAY.  Dispense:  60 tablet; Refill: 11 - Lipid panel; Future  2. Nonalcoholic fatty liver disease without nonalcoholic steatohepatitis (NASH) - CBC with Differential/Platelet; Future - CMP14+EGFR; Future  3. Autoimmune hepatitis ([place]) - budesonide (ENTOCORT EC) 3 MG 24 hr capsule; Take 3 mg by mouth 2 (two) times daily. - mycophenolate (CELLCEPT) 500 MG tablet; Take 500 mg by mouth 2 (two) times daily. - CBC with Differential/Platelet; Future - CMP14+EGFR; Future - IGG; Future - IGG; Standing - CMP14+EGFR; Standing  4. Diabetes mellitus without complication ([place]) - metFORMIN (GLUCOPHAGE) 500 MG tablet; TAKE (1) TABLET TWICE A DAY.  Dispense: 60 tablet; Refill: 11 - CBC with Differential/Platelet; Future - CMP14+EGFR; Future - Microalbumin / creatinine urine ratio; Future - Bayer DCA Hb A1c Waived; Future  5. Thyroid nodule - TSH; Future   Start time: 8:05 AM End time: 8:22 AM  Meds ordered this encounter  Medications  . metFORMIN (GLUCOPHAGE) 500 MG tablet    Sig: TAKE (1) TABLET TWICE A DAY.    Dispense:  60 tablet    Refill:  11    Order Specific Question:   Supervising Provider    Answer:   [name] [[id number]]    Particia Nearing PA-C [place] [phone]

## 2018-08-10 ENCOUNTER — Other Ambulatory Visit: Payer: Self-pay

## 2018-08-10 ENCOUNTER — Other Ambulatory Visit: Payer: BLUE CROSS/BLUE SHIELD

## 2018-08-10 DIAGNOSIS — K754 Autoimmune hepatitis: Secondary | ICD-10-CM | POA: Diagnosis not present

## 2018-08-10 DIAGNOSIS — E1169 Type 2 diabetes mellitus with other specified complication: Secondary | ICD-10-CM

## 2018-08-10 DIAGNOSIS — E041 Nontoxic single thyroid nodule: Secondary | ICD-10-CM | POA: Diagnosis not present

## 2018-08-10 DIAGNOSIS — K760 Fatty (change of) liver, not elsewhere classified: Secondary | ICD-10-CM

## 2018-08-10 DIAGNOSIS — E785 Hyperlipidemia, unspecified: Secondary | ICD-10-CM

## 2018-08-10 DIAGNOSIS — E119 Type 2 diabetes mellitus without complications: Secondary | ICD-10-CM

## 2018-08-10 LAB — BAYER DCA HB A1C WAIVED: HB A1C (BAYER DCA - WAIVED): 6 % (ref <7.0)

## 2018-08-11 LAB — CBC WITH DIFFERENTIAL/PLATELET
Basophils Absolute: 0 10*3/uL (ref 0.0–0.2)
Basos: 1 %
EOS (ABSOLUTE): 0.3 10*3/uL (ref 0.0–0.4)
Eos: 6 %
Hematocrit: 41.9 % (ref 34.0–46.6)
Hemoglobin: 14.4 g/dL (ref 11.1–15.9)
Immature Grans (Abs): 0 10*3/uL (ref 0.0–0.1)
Immature Granulocytes: 0 %
Lymphocytes Absolute: 1.5 10*3/uL (ref 0.7–3.1)
Lymphs: 27 %
MCH: 32.7 pg (ref 26.6–33.0)
MCHC: 34.4 g/dL (ref 31.5–35.7)
MCV: 95 fL (ref 79–97)
Monocytes Absolute: 0.8 10*3/uL (ref 0.1–0.9)
Monocytes: 13 %
Neutrophils Absolute: 3.1 10*3/uL (ref 1.4–7.0)
Neutrophils: 53 %
Platelets: 160 10*3/uL (ref 150–450)
RBC: 4.41 x10E6/uL (ref 3.77–5.28)
RDW: 12.1 % (ref 11.7–15.4)
WBC: 5.8 10*3/uL (ref 3.4–10.8)

## 2018-08-11 LAB — LIPID PANEL
Chol/HDL Ratio: 4.2 ratio (ref 0.0–4.4)
Cholesterol, Total: 192 mg/dL (ref 100–199)
HDL: 46 mg/dL (ref >39)
LDL Calculated: 105 mg/dL — ABNORMAL HIGH (ref 0–99)
Triglycerides: 203 mg/dL — ABNORMAL HIGH (ref 0–149)
VLDL Cholesterol Cal: 41 mg/dL — ABNORMAL HIGH (ref 5–40)

## 2018-08-11 LAB — CMP14+EGFR
ALT: 134 IU/L — ABNORMAL HIGH (ref 0–32)
AST: 97 IU/L — ABNORMAL HIGH (ref 0–40)
Albumin/Globulin Ratio: 1 — ABNORMAL LOW (ref 1.2–2.2)
Albumin: 3.9 g/dL (ref 3.8–4.8)
Alkaline Phosphatase: 101 IU/L (ref 39–117)
BUN/Creatinine Ratio: 10 — ABNORMAL LOW (ref 12–28)
BUN: 10 mg/dL (ref 8–27)
Bilirubin Total: 0.5 mg/dL (ref 0.0–1.2)
CO2: 21 mmol/L (ref 20–29)
Calcium: 9.5 mg/dL (ref 8.7–10.3)
Chloride: 104 mmol/L (ref 96–106)
Creatinine, Ser: 0.96 mg/dL (ref 0.57–1.00)
GFR calc Af Amer: 73 mL/min/{1.73_m2} (ref >59)
GFR calc non Af Amer: 64 mL/min/{1.73_m2} (ref >59)
Globulin, Total: 4.1 g/dL (ref 1.5–4.5)
Glucose: 111 mg/dL — ABNORMAL HIGH (ref 65–99)
Potassium: 4.2 mmol/L (ref 3.5–5.2)
Sodium: 137 mmol/L (ref 134–144)
Total Protein: 8 g/dL (ref 6.0–8.5)

## 2018-08-11 LAB — TSH: TSH: 2.64 u[IU]/mL (ref 0.450–4.500)

## 2018-08-11 LAB — IGG: IgG (Immunoglobin G), Serum: 2965 mg/dL — ABNORMAL HIGH (ref 586–1602)

## 2018-09-12 DIAGNOSIS — K754 Autoimmune hepatitis: Secondary | ICD-10-CM | POA: Diagnosis not present

## 2018-10-11 ENCOUNTER — Other Ambulatory Visit: Payer: Self-pay

## 2018-10-12 ENCOUNTER — Ambulatory Visit (INDEPENDENT_AMBULATORY_CARE_PROVIDER_SITE_OTHER): Payer: BC Managed Care – PPO | Admitting: Physician Assistant

## 2018-10-12 ENCOUNTER — Encounter: Payer: Self-pay | Admitting: Physician Assistant

## 2018-10-12 VITALS — BP 127/73 | HR 64 | Temp 98.0°F | Ht 65.0 in | Wt 206.4 lb

## 2018-10-12 DIAGNOSIS — K219 Gastro-esophageal reflux disease without esophagitis: Secondary | ICD-10-CM | POA: Diagnosis not present

## 2018-10-12 DIAGNOSIS — E119 Type 2 diabetes mellitus without complications: Secondary | ICD-10-CM | POA: Diagnosis not present

## 2018-10-12 DIAGNOSIS — K760 Fatty (change of) liver, not elsewhere classified: Secondary | ICD-10-CM

## 2018-10-12 DIAGNOSIS — K754 Autoimmune hepatitis: Secondary | ICD-10-CM | POA: Diagnosis not present

## 2018-10-12 MED ORDER — PANTOPRAZOLE SODIUM 40 MG PO TBEC: 40.0000 mg | DELAYED_RELEASE_TABLET | Freq: Every day | ORAL | 11 refills | Status: DC

## 2018-10-13 LAB — LIPID PANEL
Chol/HDL Ratio: 3.1 ratio (ref 0.0–4.4)
Cholesterol, Total: 200 mg/dL — ABNORMAL HIGH (ref 100–199)
HDL: 65 mg/dL (ref >39)
LDL Calculated: 107 mg/dL — ABNORMAL HIGH (ref 0–99)
Triglycerides: 139 mg/dL (ref 0–149)
VLDL Cholesterol Cal: 28 mg/dL (ref 5–40)

## 2018-10-13 LAB — CBC WITH DIFFERENTIAL/PLATELET
Basophils Absolute: 0 10*3/uL (ref 0.0–0.2)
Basos: 0 %
EOS (ABSOLUTE): 0.1 10*3/uL (ref 0.0–0.4)
Eos: 2 %
Hematocrit: 43.2 % (ref 34.0–46.6)
Hemoglobin: 14.7 g/dL (ref 11.1–15.9)
Immature Grans (Abs): 0 10*3/uL (ref 0.0–0.1)
Immature Granulocytes: 0 %
Lymphocytes Absolute: 1.5 10*3/uL (ref 0.7–3.1)
Lymphs: 29 %
MCH: 32 pg (ref 26.6–33.0)
MCHC: 34 g/dL (ref 31.5–35.7)
MCV: 94 fL (ref 79–97)
Monocytes Absolute: 0.7 10*3/uL (ref 0.1–0.9)
Monocytes: 15 %
Neutrophils Absolute: 2.7 10*3/uL (ref 1.4–7.0)
Neutrophils: 54 %
Platelets: 138 10*3/uL — ABNORMAL LOW (ref 150–450)
RBC: 4.59 x10E6/uL (ref 3.77–5.28)
RDW: 11.9 % (ref 11.7–15.4)
WBC: 5 10*3/uL (ref 3.4–10.8)

## 2018-10-13 LAB — IGG: IgG (Immunoglobin G), Serum: 2228 mg/dL — ABNORMAL HIGH (ref 586–1602)

## 2018-10-15 NOTE — Progress Notes (Signed)
BP 127/73   Pulse 64   Temp 98 F (36.7 C) (Oral)   Ht _0  (1.651 m)   Wt 206 lb 6.4 oz (93.6 kg)   BMI 34.35 kg/m    Subjective:    Patient ID: Taylor Leach, female    DOB: 16-Oct-1955, 63 y.o.   MRN: [id number]  HPI: Taylor Leach is a 63 y.o. female presenting on 10/12/2018 for Diabetes, Hyperlipidemia, and Hypertension  This patient comes in for recheck on her chronic conditions that include diabetes, hyperlipidemia, hypertension, autoimmune hepatitis.  She was started on Entocort by gastroenterology and does need labs performed today to see what her numbers are doing.  She also needs to have her lipids rechecked to see if things are doing better.  Overall she has very good readings on everything is going on.  There are no other complaints at this time.  Past Medical History:  Diagnosis Date  . Arthritis   . Autoimmune hepatitis ([place])   . Chest pain    Stress echo normal, October, 2012  . Dyslipidemia   . Ejection fraction    EF 55-60%, echo, October, 2012  . GERD (gastroesophageal reflux disease)    Relevant past medical, surgical, family and social history reviewed and updated as indicated. Interim medical history since our last visit reviewed. Allergies and medications reviewed and updated. DATA REVIEWED: CHART IN EPIC  Family History reviewed for pertinent findings.  Review of Systems  Constitutional: Negative.   HENT: Negative.   Eyes: Negative.   Respiratory: Negative.   Gastrointestinal: Negative.   Genitourinary: Negative.     Allergies as of 10/12/2018      Reactions   Fenofibrate Micronized Other (See Comments)   Caused increased LFT's Caused increased LFT's   Codeine    Hydrocodone Nausea And Vomiting, Other (See Comments)   Makes body hot.    Hydrocodone-acetaminophen Nausea And Vomiting      Medication List       Accurate as of October 12, 2018 11:59 PM. If you have any questions, ask your nurse or doctor.        STOP taking these medications    atorvastatin 10 MG tablet Commonly known as: LIPITOR Stopped by: Terald Sleeper, PA-C   lisinopril 5 MG tablet Commonly known as: ZESTRIL Stopped by: Terald Sleeper, PA-C   mercaptopurine 50 MG tablet Commonly known as: PURINETHOL Stopped by: Terald Sleeper, PA-C   mycophenolate 500 MG tablet Commonly known as: CELLCEPT Stopped by: Terald Sleeper, PA-C   naproxen 500 MG tablet Commonly known as: NAPROSYN Stopped by: Terald Sleeper, PA-C   nystatin 100000 UNIT/ML suspension Commonly known as: MYCOSTATIN Stopped by: Terald Sleeper, PA-C     TAKE these medications   blood glucose meter kit and supplies Kit Dispense based on patient and insurance preference. Use up to four times daily as directed. (FOR ICD-9 250.00, 250.01). E11.9   budesonide 3 MG 24 hr capsule Commonly known as: ENTOCORT EC Take 3 mg by mouth 2 (two) times daily.   Contour Next Test test strip Generic drug: glucose blood CHECK BLOOD SUGAR UP TO 4 TIMES A DAY OR AS DIRECTED   fluticasone 50 MCG/ACT nasal spray Commonly known as: FLONASE Place 2 sprays into both nostrils daily. Sinus Allergies   magnesium 30 MG tablet Take 30 mg by mouth 2 (two) times daily.   metFORMIN 500 MG tablet Commonly known as: GLUCOPHAGE TAKE (1) TABLET TWICE A DAY.  pantoprazole 40 MG tablet Commonly known as: PROTONIX Take 1 tablet (40 mg total) by mouth daily.   promethazine 25 MG tablet Commonly known as: PHENERGAN Take 1 tablet (25 mg total) by mouth every 8 (eight) hours as needed for nausea or vomiting.   vitamin B-12 1000 MCG tablet Commonly known as: CYANOCOBALAMIN Take 1,000 mcg by mouth daily.   vitamin E 1000 UNIT capsule Take 1,000 Units by mouth daily.          Objective:    BP 127/73   Pulse 64   Temp 98 F (36.7 C) (Oral)   Ht _0  (1.651 m)   Wt 206 lb 6.4 oz (93.6 kg)   BMI 34.35 kg/m   Allergies  Allergen Reactions  . Fenofibrate Micronized Other (See Comments)    Caused increased  LFT's Caused increased LFT's  . Codeine   . Hydrocodone Nausea And Vomiting and Other (See Comments)    Makes body hot.   . Hydrocodone-Acetaminophen Nausea And Vomiting    Wt Readings from Last 3 Encounters:  10/12/18 206 lb 6.4 oz (93.6 kg)  05/04/18 212 lb 9.6 oz (96.4 kg)  11/30/17 223 lb 6.4 oz (101.3 kg)    Physical Exam Constitutional:      Appearance: She is well-developed.  HENT:     Head: Normocephalic and atraumatic.  Eyes:     Conjunctiva/sclera: Conjunctivae normal.     Pupils: Pupils are equal, round, and reactive to light.  Cardiovascular:     Rate and Rhythm: Normal rate and regular rhythm.     Heart sounds: Normal heart sounds.  Pulmonary:     Effort: Pulmonary effort is normal.     Breath sounds: Normal breath sounds.  Abdominal:     General: Bowel sounds are normal.     Palpations: Abdomen is soft.  Skin:    General: Skin is warm and dry.     Findings: No rash.  Neurological:     Mental Status: She is alert and oriented to person, place, and time.     Deep Tendon Reflexes: Reflexes are normal and symmetric.  Psychiatric:        Behavior: Behavior normal.        Thought Content: Thought content normal.        Judgment: Judgment normal.     Results for orders placed or performed in visit on 10/12/18  CBC with Differential/Platelet  Result Value Ref Range   WBC 5.0 3.4 - 10.8 x10E3/uL   RBC 4.59 3.77 - 5.28 x10E6/uL   Hemoglobin 14.7 11.1 - 15.9 g/dL   Hematocrit 43.2 34.0 - 46.6 %   MCV 94 79 - 97 fL   MCH 32.0 26.6 - 33.0 pg   MCHC 34.0 31.5 - 35.7 g/dL   RDW 11.9 11.7 - 15.4 %   Platelets 138 (L) 150 - 450 x10E3/uL   Neutrophils 54 Not Estab. %   Lymphs 29 Not Estab. %   Monocytes 15 Not Estab. %   Eos 2 Not Estab. %   Basos 0 Not Estab. %   Neutrophils Absolute 2.7 1.4 - 7.0 x10E3/uL   Lymphocytes Absolute 1.5 0.7 - 3.1 x10E3/uL   Monocytes Absolute 0.7 0.1 - 0.9 x10E3/uL   EOS (ABSOLUTE) 0.1 0.0 - 0.4 x10E3/uL   Basophils Absolute  0.0 0.0 - 0.2 x10E3/uL   Immature Granulocytes 0 Not Estab. %   Immature Grans (Abs) 0.0 0.0 - 0.1 x10E3/uL  Lipid panel  Result Value Ref Range  Cholesterol, Total 200 (H) 100 - 199 mg/dL   Triglycerides 139 0 - 149 mg/dL   HDL 65 >39 mg/dL   VLDL Cholesterol Cal 28 5 - 40 mg/dL   LDL Calculated 107 (H) 0 - 99 mg/dL   Chol/HDL Ratio 3.1 0.0 - 4.4 ratio  IgG  Result Value Ref Range   IgG (Immunoglobin G), Serum 2,228 (H) 586 - 1,602 mg/dL      Assessment & Plan:   1. Nonalcoholic fatty liver disease without nonalcoholic steatohepatitis (NASH) - CBC with Differential/Platelet - Lipid panel - IgG  2. Autoimmune hepatitis ([place]) - CBC with Differential/Platelet - Lipid panel - IgG  3. Gastroesophageal reflux disease without esophagitis - pantoprazole (PROTONIX) 40 MG tablet; Take 1 tablet (40 mg total) by mouth daily.  Dispense: 30 tablet; Refill: 11   Continue all other maintenance medications as listed above.  Follow up plan: Return in about 3 months (around 01/12/2019).  Educational handout given for  [place] PA-C [place] [address]  [place], [place] [address] [phone]   10/15/2018, 9:07 PM

## 2018-10-18 ENCOUNTER — Other Ambulatory Visit: Payer: Self-pay | Admitting: *Deleted

## 2018-10-18 DIAGNOSIS — Z1211 Encounter for screening for malignant neoplasm of colon: Secondary | ICD-10-CM

## 2018-10-18 LAB — HGB A1C W/O EAG: Hgb A1c MFr Bld: 6.9 % — ABNORMAL HIGH (ref 4.8–5.6)

## 2018-10-18 LAB — CMP14+EGFR
ALT: 48 IU/L — ABNORMAL HIGH (ref 0–32)
AST: 34 IU/L (ref 0–40)
Albumin/Globulin Ratio: 1.1 — ABNORMAL LOW (ref 1.2–2.2)
Albumin: 4 g/dL (ref 3.8–4.8)
Alkaline Phosphatase: 75 IU/L (ref 39–117)
BUN/Creatinine Ratio: 12 (ref 12–28)
BUN: 13 mg/dL (ref 8–27)
Bilirubin Total: <0.2 mg/dL (ref 0.0–1.2)
CO2: 19 mmol/L — ABNORMAL LOW (ref 20–29)
Calcium: 9.1 mg/dL (ref 8.7–10.3)
Chloride: 100 mmol/L (ref 96–106)
Creatinine, Ser: 1.07 mg/dL — ABNORMAL HIGH (ref 0.57–1.00)
GFR calc Af Amer: 64 mL/min/{1.73_m2} (ref >59)
GFR calc non Af Amer: 56 mL/min/{1.73_m2} — ABNORMAL LOW (ref >59)
Globulin, Total: 3.6 g/dL (ref 1.5–4.5)
Glucose: 79 mg/dL (ref 65–99)
Potassium: 4.4 mmol/L (ref 3.5–5.2)
Sodium: 141 mmol/L (ref 134–144)
Total Protein: 7.6 g/dL (ref 6.0–8.5)

## 2018-10-18 LAB — SPECIMEN STATUS REPORT

## 2018-10-18 NOTE — Progress Notes (Signed)
amb  

## 2018-11-13 IMAGING — CT CT CHEST W/O CM
3 of 4 series · 17 of 30 positions shown, 19 images · non-contrast
Comparison: Chest radiograph 08/26/2012

CLINICAL DATA: Follow-up of right chest mass.

EXAM:
CT CHEST WITHOUT CONTRAST
TECHNIQUE: Multidetector CT imaging of the chest was performed following the
standard protocol without IV contrast.

[Series 3: chest w/o · axial · non-contrast · 0.70mm/px · z∈[-269,-37]mm · 7 of 125 slices shown, 9 images]
[im 16/125  mediastinal]
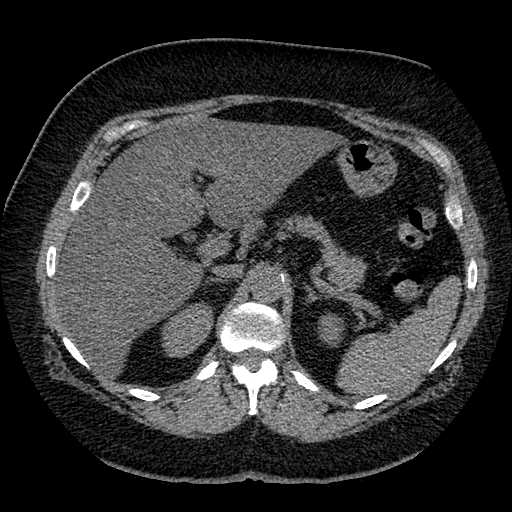
[im 16/125  lung]
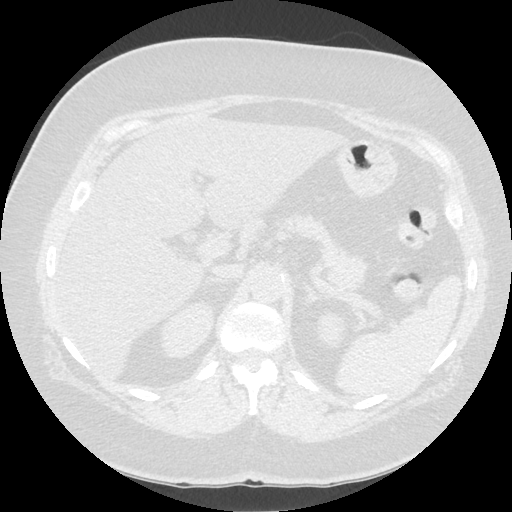
[im 32/125  lung]
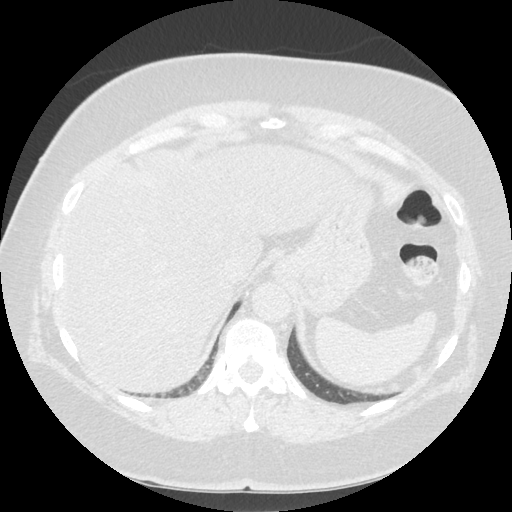
[im 47/125  lung]
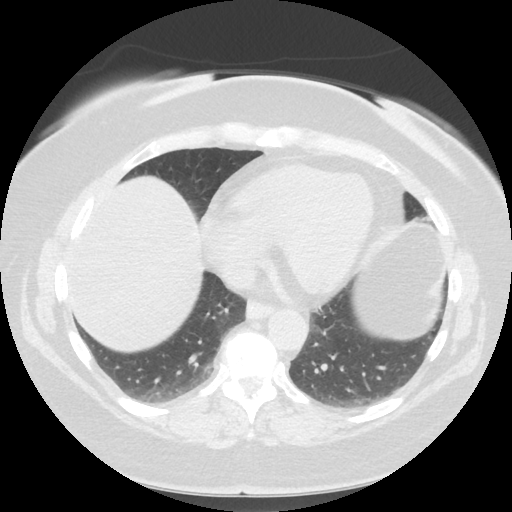
[im 63/125  lung]
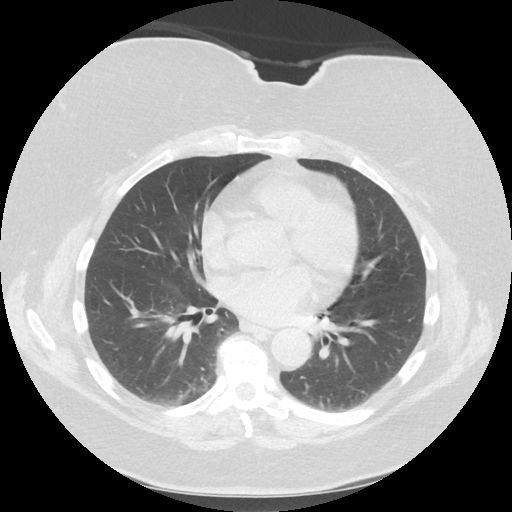
[im 78/125  mediastinal]
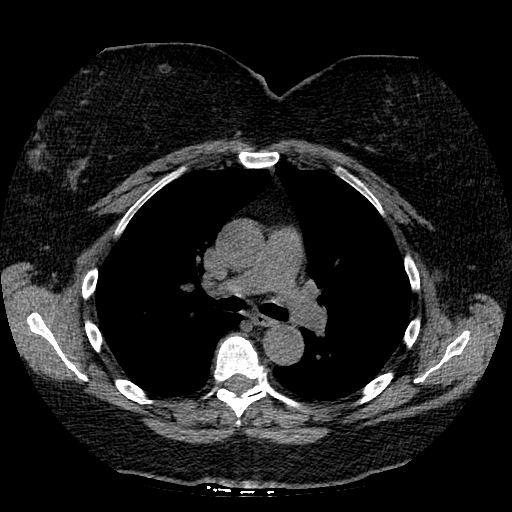
[im 78/125  lung]
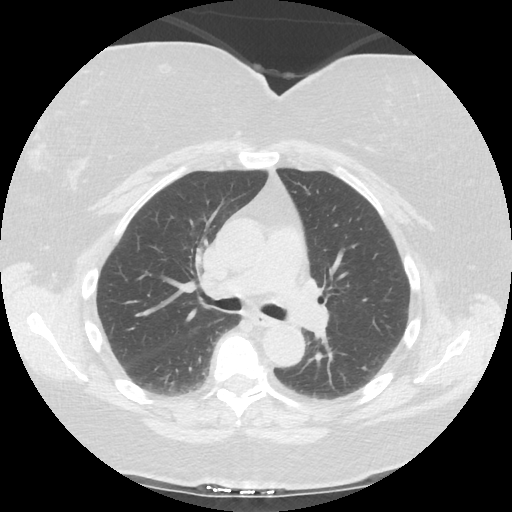
[im 94/125  lung]
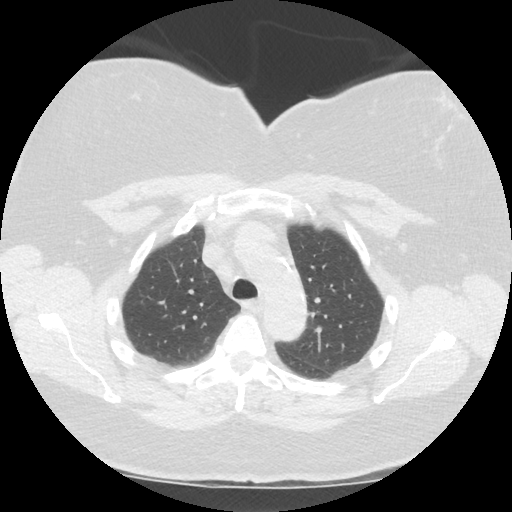
[im 109/125  lung]
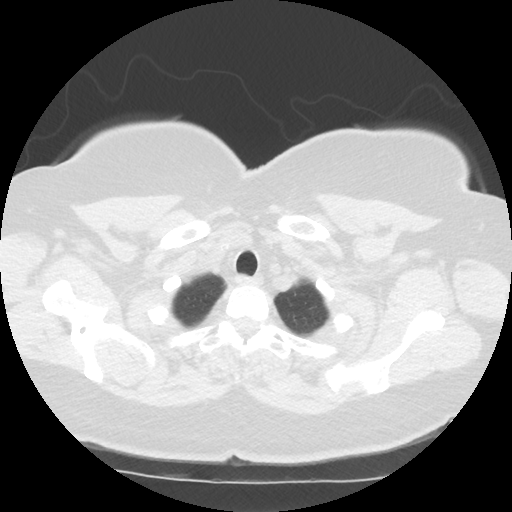

[Series 4: lung windows · axial · 0.70mm/px · z∈[-269,-34]mm · 7 of 126 slices shown]
[im 16/126  lung]
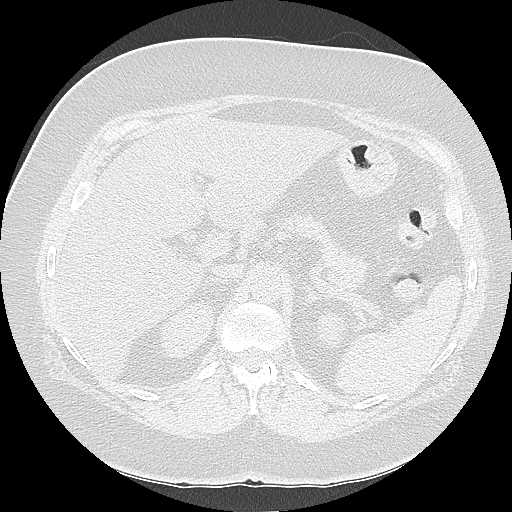
[im 32/126  lung]
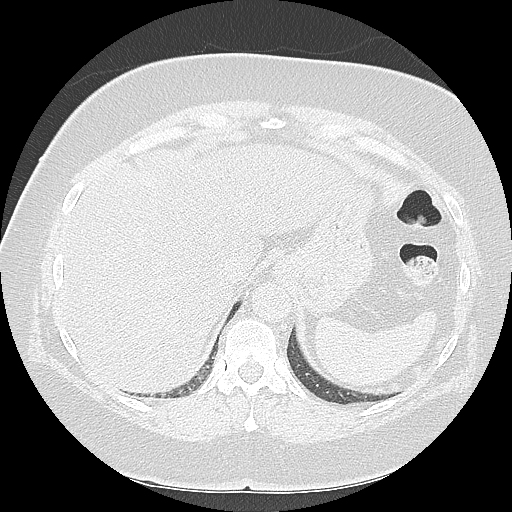
[im 47/126  lung]
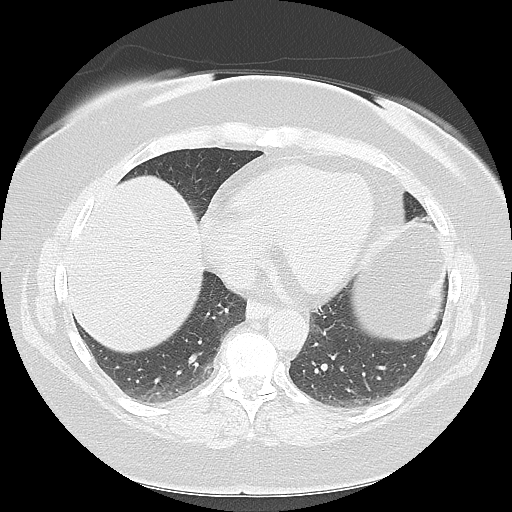
[im 63/126  lung]
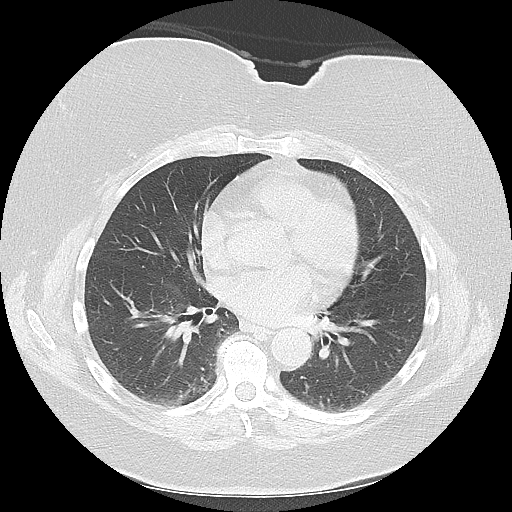
[im 79/126  lung]
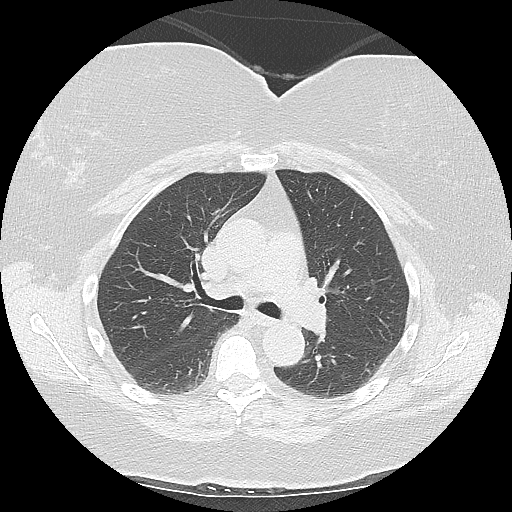
[im 94/126  lung]
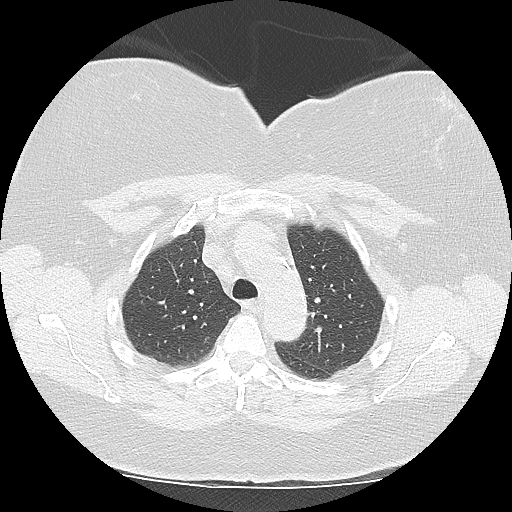
[im 110/126  lung]
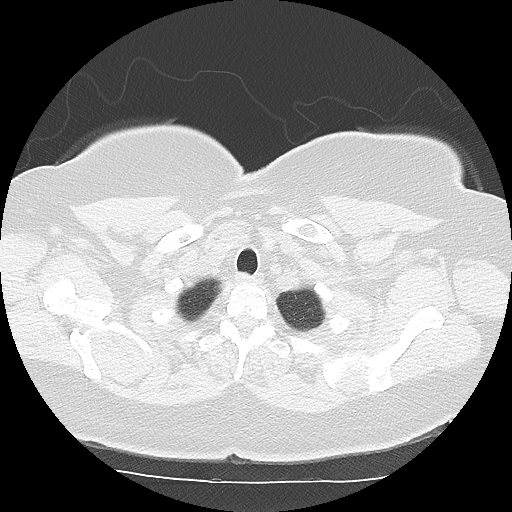

[Series 602: sagittal body · sagittal · 0.70mm/px · 3 of 145 slices shown]
[im 17/145  mediastinal]
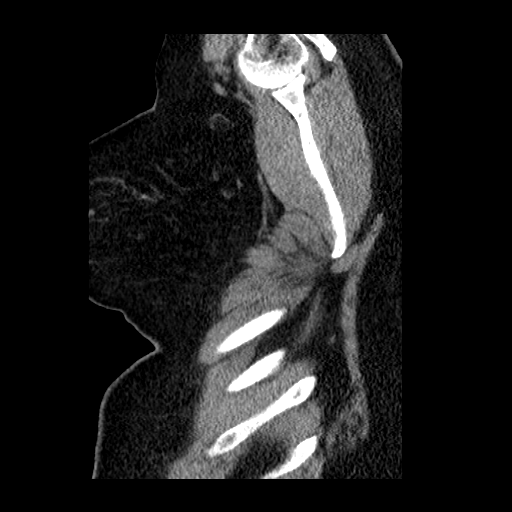
[im 33/145  mediastinal]
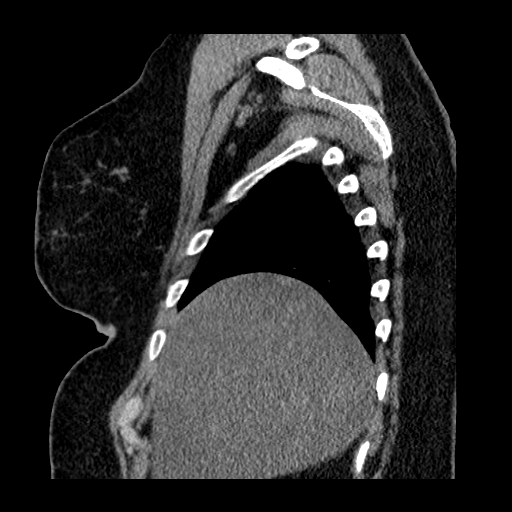
[im 49/145  mediastinal]
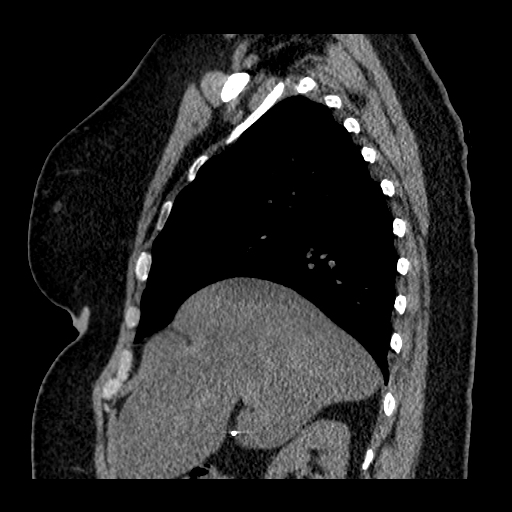

[17 of 30 positions shown; findings below may reference images not displayed]

FINDINGS: Cardiovascular: Normal heart size. No pericardial effusion. Normal
size of the great vessels.

Mediastinum/Nodes: There is an enlarged lymph node in right
pretracheal location which measures 1.6 cm in short axis. Two soft
tissue nodules are seen in the pericardial fat, adjacent to the apex
of the heart, the larger of which measures 10 mm.

Lungs/Pleura: Lungs are clear. No pleural effusion or pneumothorax.

Upper Abdomen: No acute abnormality.  Postcholecystectomy.

Musculoskeletal: Mild osteoarthritic changes of the thoracic spine.
IMPRESSION: Single enlarged right pretracheal lymph node measuring 1.6 cm in
short axis.

Two soft tissue nodules in the pericardial fat which may represent
borderline enlarged lymph nodes.

No evidence of pulmonary disease.

## 2018-12-12 DIAGNOSIS — K635 Polyp of colon: Secondary | ICD-10-CM | POA: Diagnosis not present

## 2018-12-12 DIAGNOSIS — Z6835 Body mass index (BMI) 35.0-35.9, adult: Secondary | ICD-10-CM | POA: Diagnosis not present

## 2018-12-12 DIAGNOSIS — K754 Autoimmune hepatitis: Secondary | ICD-10-CM | POA: Diagnosis not present

## 2019-01-22 ENCOUNTER — Ambulatory Visit (INDEPENDENT_AMBULATORY_CARE_PROVIDER_SITE_OTHER): Payer: BC Managed Care – PPO | Admitting: Physician Assistant

## 2019-01-22 ENCOUNTER — Other Ambulatory Visit: Payer: Self-pay

## 2019-01-22 ENCOUNTER — Encounter: Payer: Self-pay | Admitting: Physician Assistant

## 2019-01-22 VITALS — BP 140/73 | HR 72 | Temp 97.0°F | Ht 65.0 in | Wt 213.6 lb

## 2019-01-22 DIAGNOSIS — L309 Dermatitis, unspecified: Secondary | ICD-10-CM

## 2019-01-22 MED ORDER — CLOBETASOL PROP EMOLLIENT BASE 0.05 % EX CREA: 1.0000 "application " | TOPICAL_CREAM | Freq: Two times a day (BID) | CUTANEOUS | 2 refills | Status: DC

## 2019-01-22 MED ORDER — FAMOTIDINE 20 MG PO TABS: 20.0000 mg | ORAL_TABLET | Freq: Two times a day (BID) | ORAL | 5 refills | Status: DC

## 2019-01-22 MED ORDER — LORATADINE 10 MG PO TABS: 10.0000 mg | ORAL_TABLET | Freq: Every day | ORAL | 11 refills | Status: DC

## 2019-01-22 MED ORDER — CETIRIZINE HCL 10 MG PO TABS: 10.0000 mg | ORAL_TABLET | Freq: Every day | ORAL | 11 refills | Status: DC

## 2019-01-22 NOTE — Patient Instructions (Signed)
Claritin 10 mg 1 in the morning Zyrtec 10 mg 1 in the evening Pepcid 1 tablet twice daily

## 2019-01-27 NOTE — Progress Notes (Signed)
BP 140/73   Pulse 72   Temp (!) 97 F (36.1 C)   Ht '5\' 5"'  (1.651 m)   Wt 213 lb 9.6 oz (96.9 kg)   BMI 35.54 kg/m    Subjective:    Patient ID: Taylor Leach, female    DOB: 15-Jun-1955, 63 y.o.   MRN: [id number]  HPI: LIZZET HENDLEY is a 63 y.o. female presenting on 01/22/2019 for Rash (all aover body just getting worse)  Patient reports that she has been having worsening symptoms over the last few weeks.  She denies getting into anything new.  She denies take any new medications.  She has had a little bit of problems with.  The areas will swell up and itch.  She will find herself scratching a lot at night.  There will be small red bumps, under the skin.  She denies any fever or chills.  The patient is being seen by hepatologist for her autoimmune hepatitis.  Past Medical History:  Diagnosis Date  . Arthritis   . Autoimmune hepatitis ([place])   . Chest pain    Stress echo normal, October, 2012  . Dyslipidemia   . Ejection fraction    EF 55-60%, echo, October, 2012  . GERD (gastroesophageal reflux disease)    Relevant past medical, surgical, family and social history reviewed and updated as indicated. Interim medical history since our last visit reviewed. Allergies and medications reviewed and updated. DATA REVIEWED: CHART IN EPIC  Family History reviewed for pertinent findings.  Review of Systems  Constitutional: Negative.   HENT: Negative.   Eyes: Negative.   Respiratory: Negative.   Gastrointestinal: Negative.   Genitourinary: Negative.   Skin: Positive for color change and rash.    Allergies as of 01/22/2019      Reactions   Fenofibrate Micronized Other (See Comments)   Caused increased LFT's Caused increased LFT's   Codeine    Hydrocodone Nausea And Vomiting, Other (See Comments)   Makes body hot.    Hydrocodone-acetaminophen Nausea And Vomiting      Medication List       Accurate as of January 22, 2019 11:59 PM. If you have any questions, ask your nurse or  doctor.        blood glucose meter kit and supplies Kit Dispense based on patient and insurance preference. Use up to four times daily as directed. (FOR ICD-9 250.00, 250.01). E11.9   budesonide 3 MG 24 hr capsule Commonly known as: ENTOCORT EC Take 3 mg by mouth 2 (two) times daily.   cetirizine 10 MG tablet Commonly known as: ZYRTEC Take 1 tablet (10 mg total) by mouth at bedtime. Started by: Terald Sleeper, PA-C   Clobetasol Prop Emollient Base 0.05 % emollient cream Apply 1 application topically 2 (two) times daily. Started by: Terald Sleeper, PA-C   Contour Next Test test strip Generic drug: glucose blood CHECK BLOOD SUGAR UP TO 4 TIMES A DAY OR AS DIRECTED   famotidine 20 MG tablet Commonly known as: Pepcid Take 1 tablet (20 mg total) by mouth 2 (two) times daily. Started by: Terald Sleeper, PA-C   fluticasone 50 MCG/ACT nasal spray Commonly known as: FLONASE Place 2 sprays into both nostrils daily. Sinus Allergies   loratadine 10 MG tablet Commonly known as: CLARITIN Take 1 tablet (10 mg total) by mouth daily. Started by: Terald Sleeper, PA-C   magnesium 30 MG tablet Take 30 mg by mouth 2 (two) times daily.  metFORMIN 500 MG tablet Commonly known as: GLUCOPHAGE TAKE (1) TABLET TWICE A DAY.   mycophenolate 180 MG EC tablet Commonly known as: MYFORTIC Take by mouth.   pantoprazole 40 MG tablet Commonly known as: PROTONIX Take 1 tablet (40 mg total) by mouth daily.   promethazine 25 MG tablet Commonly known as: PHENERGAN Take 1 tablet (25 mg total) by mouth every 8 (eight) hours as needed for nausea or vomiting.   vitamin B-12 1000 MCG tablet Commonly known as: CYANOCOBALAMIN Take 1,000 mcg by mouth daily.   vitamin E 1000 UNIT capsule Take 1,000 Units by mouth daily.          Objective:    BP 140/73   Pulse 72   Temp (!) 97 F (36.1 C)   Ht '5\' 5"'  (1.651 m)   Wt 213 lb 9.6 oz (96.9 kg)   BMI 35.54 kg/m   Allergies  Allergen Reactions   . Fenofibrate Micronized Other (See Comments)    Caused increased LFT's Caused increased LFT's  . Codeine   . Hydrocodone Nausea And Vomiting and Other (See Comments)    Makes body hot.   . Hydrocodone-Acetaminophen Nausea And Vomiting    Wt Readings from Last 3 Encounters:  01/22/19 213 lb 9.6 oz (96.9 kg)  10/12/18 206 lb 6.4 oz (93.6 kg)  05/04/18 212 lb 9.6 oz (96.4 kg)    Physical Exam Constitutional:      General: She is not in acute distress.    Appearance: Normal appearance. She is well-developed.  HENT:     Head: Normocephalic and atraumatic.  Cardiovascular:     Rate and Rhythm: Normal rate.  Pulmonary:     Effort: Pulmonary effort is normal.  Skin:    General: Skin is warm and dry.     Findings: No rash.  Neurological:     Mental Status: She is alert and oriented to person, place, and time.     Deep Tendon Reflexes: Reflexes are normal and symmetric.         Assessment & Plan:   1. Eczema, unspecified type - loratadine (CLARITIN) 10 MG tablet; Take 1 tablet (10 mg total) by mouth daily.  Dispense: 30 tablet; Refill: 11 - cetirizine (ZYRTEC) 10 MG tablet; Take 1 tablet (10 mg total) by mouth at bedtime.  Dispense: 30 tablet; Refill: 11 - famotidine (PEPCID) 20 MG tablet; Take 1 tablet (20 mg total) by mouth 2 (two) times daily.  Dispense: 60 tablet; Refill: 5 - Clobetasol Prop Emollient Base 0.05 % emollient cream; Apply 1 application topically 2 (two) times daily.  Dispense: 60 g; Refill: 2   Continue all other maintenance medications as listed above.  Follow up plan: No follow-ups on file.  Educational handout given for [place] PA-C [place] [address]  [place], [place] [address] [phone]   01/27/2019, 10:13 PM

## 2019-02-26 ENCOUNTER — Ambulatory Visit (INDEPENDENT_AMBULATORY_CARE_PROVIDER_SITE_OTHER): Payer: BC Managed Care – PPO | Admitting: Physician Assistant

## 2019-02-26 ENCOUNTER — Encounter: Payer: Self-pay | Admitting: Physician Assistant

## 2019-02-26 DIAGNOSIS — K760 Fatty (change of) liver, not elsewhere classified: Secondary | ICD-10-CM | POA: Diagnosis not present

## 2019-02-26 DIAGNOSIS — E119 Type 2 diabetes mellitus without complications: Secondary | ICD-10-CM | POA: Diagnosis not present

## 2019-02-26 DIAGNOSIS — H00019 Hordeolum externum unspecified eye, unspecified eyelid: Secondary | ICD-10-CM

## 2019-02-26 DIAGNOSIS — K754 Autoimmune hepatitis: Secondary | ICD-10-CM

## 2019-02-26 MED ORDER — CEPHALEXIN 500 MG PO CAPS: 500.0000 mg | ORAL_CAPSULE | Freq: Four times a day (QID) | ORAL | 0 refills | Status: DC

## 2019-02-27 LAB — CMP14+EGFR
ALT: 50 IU/L — ABNORMAL HIGH (ref 0–32)
AST: 33 IU/L (ref 0–40)
Albumin/Globulin Ratio: 1.2 (ref 1.2–2.2)
Albumin: 4.1 g/dL (ref 3.8–4.8)
Alkaline Phosphatase: 67 IU/L (ref 39–117)
BUN/Creatinine Ratio: 17 (ref 12–28)
BUN: 15 mg/dL (ref 8–27)
Bilirubin Total: 0.4 mg/dL (ref 0.0–1.2)
CO2: 21 mmol/L (ref 20–29)
Calcium: 9.7 mg/dL (ref 8.7–10.3)
Chloride: 100 mmol/L (ref 96–106)
Creatinine, Ser: 0.9 mg/dL (ref 0.57–1.00)
GFR calc Af Amer: 79 mL/min/{1.73_m2} (ref >59)
GFR calc non Af Amer: 69 mL/min/{1.73_m2} (ref >59)
Globulin, Total: 3.3 g/dL (ref 1.5–4.5)
Glucose: 118 mg/dL — ABNORMAL HIGH (ref 65–99)
Potassium: 4.4 mmol/L (ref 3.5–5.2)
Sodium: 137 mmol/L (ref 134–144)
Total Protein: 7.4 g/dL (ref 6.0–8.5)

## 2019-02-27 LAB — CBC WITH DIFFERENTIAL/PLATELET
Basophils Absolute: 0 10*3/uL (ref 0.0–0.2)
Basos: 1 %
EOS (ABSOLUTE): 0.1 10*3/uL (ref 0.0–0.4)
Eos: 2 %
Hematocrit: 42.6 % (ref 34.0–46.6)
Hemoglobin: 14.7 g/dL (ref 11.1–15.9)
Immature Grans (Abs): 0 10*3/uL (ref 0.0–0.1)
Immature Granulocytes: 1 %
Lymphocytes Absolute: 1.5 10*3/uL (ref 0.7–3.1)
Lymphs: 26 %
MCH: 32 pg (ref 26.6–33.0)
MCHC: 34.5 g/dL (ref 31.5–35.7)
MCV: 93 fL (ref 79–97)
Monocytes Absolute: 0.8 10*3/uL (ref 0.1–0.9)
Monocytes: 14 %
Neutrophils Absolute: 3.3 10*3/uL (ref 1.4–7.0)
Neutrophils: 56 %
Platelets: 166 10*3/uL (ref 150–450)
RBC: 4.59 x10E6/uL (ref 3.77–5.28)
RDW: 12.7 % (ref 11.7–15.4)
WBC: 5.8 10*3/uL (ref 3.4–10.8)

## 2019-02-27 LAB — MICROALBUMIN / CREATININE URINE RATIO
Creatinine, Urine: 187.2 mg/dL
Microalb/Creat Ratio: 6 mg/g creat (ref 0–29)
Microalbumin, Urine: 11.6 ug/mL

## 2019-02-27 LAB — IGG: IgG (Immunoglobin G), Serum: 1773 mg/dL — ABNORMAL HIGH (ref 586–1602)

## 2019-02-27 LAB — HEMOGLOBIN A1C
Est. average glucose Bld gHb Est-mCnc: 171 mg/dL
Hgb A1c MFr Bld: 7.6 % — ABNORMAL HIGH (ref 4.8–5.6)

## 2019-03-03 NOTE — Progress Notes (Signed)
Telephone visit  Subjective: CC: Recheck on chronic medical conditions PCP: Terald Sleeper, PA-C QQI:[facility] Taylor Leach is a 63 y.o. female calls for telephone consult today. Patient provides verbal consent for consult held via phone.  Patient is identified with 2 separate identifiers.  At this time the entire area is on COVID-19 social distancing and stay home orders are in place.  Patient is of higher risk and therefore we are performing this by a virtual method.  Location of patient: Home Location of provider: HOME Others present for call: No  Patient is having a phone visit for chronic medical conditions that do include autoimmune hepatitis, [place], diabetes, hyperlipidemia.  She states that she has been doing much better with her medication.  She is not having itching nearly as badly.  She is going to try to reduce the antihistamine.  In the fold if it comes back to just start the antihistamine back up.  She also has a stye in her eye that is red and hard.  It would not go away.  She would like to have an antibiotic for this.    She has follow-up with Dr. Drue Novel, her hepatologist.  Labs will be performed.   ROS: Per HPI  Allergies  Allergen Reactions  . Fenofibrate Micronized Other (See Comments)    Caused increased LFT's Caused increased LFT's  . Codeine   . Hydrocodone Nausea And Vomiting and Other (See Comments)    Makes body hot.   . Hydrocodone-Acetaminophen Nausea And Vomiting   Past Medical History:  Diagnosis Date  . Arthritis   . Autoimmune hepatitis ([place])   . Chest pain    Stress echo normal, October, 2012  . Dyslipidemia   . Ejection fraction    EF 55-60%, echo, October, 2012  . GERD (gastroesophageal reflux disease)     Current Outpatient Medications:  .  blood glucose meter kit and supplies KIT, Dispense based on patient and insurance preference. Use up to four times daily as directed. (FOR ICD-9 250.00, 250.01). E11.9, Disp: 1 each, Rfl: 0 .  budesonide  (ENTOCORT EC) 3 MG 24 hr capsule, Take 3 mg by mouth 2 (two) times daily., Disp: , Rfl:  .  cephALEXin (KEFLEX) 500 MG capsule, Take 1 capsule (500 mg total) by mouth 4 (four) times daily., Disp: 40 capsule, Rfl: 0 .  cetirizine (ZYRTEC) 10 MG tablet, Take 1 tablet (10 mg total) by mouth at bedtime., Disp: 30 tablet, Rfl: 11 .  Clobetasol Prop Emollient Base 0.05 % emollient cream, Apply 1 application topically 2 (two) times daily., Disp: 60 g, Rfl: 2 .  CONTOUR NEXT TEST test strip, CHECK BLOOD SUGAR UP TO 4 TIMES A DAY OR AS DIRECTED, Disp: 400 each, Rfl: 2 .  famotidine (PEPCID) 20 MG tablet, Take 1 tablet (20 mg total) by mouth 2 (two) times daily., Disp: 60 tablet, Rfl: 5 .  fluticasone (FLONASE) 50 MCG/ACT nasal spray, Place 2 sprays into both nostrils daily. Sinus Allergies, Disp: 16 g, Rfl: 11 .  loratadine (CLARITIN) 10 MG tablet, Take 1 tablet (10 mg total) by mouth daily., Disp: 30 tablet, Rfl: 11 .  magnesium 30 MG tablet, Take 30 mg by mouth 2 (two) times daily., Disp: , Rfl:  .  metFORMIN (GLUCOPHAGE) 500 MG tablet, TAKE (1) TABLET TWICE A DAY., Disp: 60 tablet, Rfl: 11 .  mycophenolate (MYFORTIC) 180 MG EC tablet, Take by mouth., Disp: , Rfl:  .  pantoprazole (PROTONIX) 40 MG tablet, Take 1  tablet (40 mg total) by mouth daily., Disp: 30 tablet, Rfl: 11 .  promethazine (PHENERGAN) 25 MG tablet, Take 1 tablet (25 mg total) by mouth every 8 (eight) hours as needed for nausea or vomiting., Disp: 20 tablet, Rfl: 0 .  vitamin B-12 (CYANOCOBALAMIN) 1000 MCG tablet, Take 1,000 mcg by mouth daily., Disp: , Rfl:  .  vitamin E 1000 UNIT capsule, Take 1,000 Units by mouth daily., Disp: , Rfl:   Assessment/ Plan: 63 y.o. female   1. Autoimmune hepatitis ([place]) - IGG; Future - CBC with Differential/Platelet; Future - CMP; Future - CMP - CBC with Differential/Platelet - IGG  2. Nonalcoholic fatty liver disease without nonalcoholic steatohepatitis ([facility]) - IGG; Future - CBC with  Differential/Platelet; Future - CMP; Future - CMP - CBC with Differential/Platelet - IGG  3. Diabetes mellitus without complication ([place]) - Hemoglobin A1c; Future - CMP; Future - CMP - Hemoglobin A1c - Microalbumin / creatinine urine ratio  4. Hordeolum externum, unspecified laterality - cephALEXin (KEFLEX) 500 MG capsule; Take 1 capsule (500 mg total) by mouth 4 (four) times daily.  Dispense: 40 capsule; Refill: 0   Return in about 3 months (around 05/29/2019).  Continue all other maintenance medications as listed above.  Start time: 7:57 AM End time: 8:10 AM  Meds ordered this encounter  Medications  . cephALEXin (KEFLEX) 500 MG capsule    Sig: Take 1 capsule (500 mg total) by mouth 4 (four) times daily.    Dispense:  40 capsule    Refill:  0    Order Specific Question:   Supervising Provider    Answer:   [name] [[id number]]    Particia Nearing PA-C [facility] [phone]

## 2019-03-06 DIAGNOSIS — K754 Autoimmune hepatitis: Secondary | ICD-10-CM | POA: Diagnosis not present

## 2019-05-01 ENCOUNTER — Other Ambulatory Visit: Payer: Self-pay

## 2019-05-02 ENCOUNTER — Encounter: Payer: Self-pay | Admitting: Physician Assistant

## 2019-05-02 ENCOUNTER — Ambulatory Visit (INDEPENDENT_AMBULATORY_CARE_PROVIDER_SITE_OTHER): Payer: BC Managed Care – PPO | Admitting: Physician Assistant

## 2019-05-02 VITALS — BP 154/94 | HR 84 | Temp 98.2°F | Ht 65.0 in | Wt 219.4 lb

## 2019-05-02 DIAGNOSIS — N3000 Acute cystitis without hematuria: Secondary | ICD-10-CM

## 2019-05-02 DIAGNOSIS — R300 Dysuria: Secondary | ICD-10-CM | POA: Diagnosis not present

## 2019-05-02 LAB — URINALYSIS, COMPLETE
Bilirubin, UA: NEGATIVE
Ketones, UA: NEGATIVE
Nitrite, UA: NEGATIVE
Protein,UA: NEGATIVE
Specific Gravity, UA: 1.01 (ref 1.005–1.030)
Urobilinogen, Ur: 0.2 mg/dL (ref 0.2–1.0)
pH, UA: 5.5 (ref 5.0–7.5)

## 2019-05-02 LAB — MICROSCOPIC EXAMINATION: Epithelial Cells (non renal): >10 /hpf — AB (ref 0–10)

## 2019-05-02 MED ORDER — HYDROCHLOROTHIAZIDE 25 MG PO TABS: 25.0000 mg | ORAL_TABLET | Freq: Every day | ORAL | 3 refills | Status: DC

## 2019-05-02 MED ORDER — SULFAMETHOXAZOLE-TRIMETHOPRIM 800-160 MG PO TABS: 1.0000 | ORAL_TABLET | Freq: Two times a day (BID) | ORAL | 0 refills | Status: DC

## 2019-05-02 MED ORDER — FLUCONAZOLE 150 MG PO TABS: ORAL_TABLET | ORAL | 0 refills | Status: DC

## 2019-05-02 NOTE — Progress Notes (Signed)
Acute Office Visit  Subjective:    Patient ID: Taylor Leach, female    DOB: [birth year], 64 y.o.   MRN: [id number]  Chief Complaint  Patient presents with  . Urinary Tract Infection  . Back Pain  . fluid retention    Urinary Tract Infection  This is a new problem. The current episode started 1 to 4 weeks ago. The problem occurs every urination. The problem has been gradually worsening. The pain is mild. There has been no fever. Associated symptoms include flank pain, frequency and urgency.    Past Medical History:  Diagnosis Date  . Arthritis   . Autoimmune hepatitis ([place])   . Chest pain    Stress echo normal, October, 2012  . Dyslipidemia   . Ejection fraction    EF 55-60%, echo, October, 2012  . GERD (gastroesophageal reflux disease)     Past Surgical History:  Procedure Laterality Date  . CHOLECYSTECTOMY    . NASAL SINUS SURGERY    . VAGINAL HYSTERECTOMY      Family History  Problem Relation Age of Onset  . Cancer Mother   . Cancer Father     Social History   Socioeconomic History  . Marital status: Married    Spouse name: Not on file  . Number of children: Not on file  . Years of education: Not on file  . Highest education level: Not on file  Occupational History  . Not on file  Tobacco Use  . Smoking status: Former Smoker    Packs/day: 1.00  . Smokeless tobacco: Never Used  Substance and Sexual Activity  . Alcohol use: Yes    Alcohol/week: 1.0 standard drinks    Types: 1 Glasses of wine per week  . Drug use: No  . Sexual activity: Yes    Birth control/protection: Surgical  Other Topics Concern  . Not on file  Social History Narrative  . Not on file   Social Determinants of Health   Financial Resource Strain:   . Difficulty of Paying Living Expenses: Not on file  Food Insecurity:   . Worried About Charity fundraiser in the Last Year: Not on file  . Ran Out of Food in the Last Year: Not on file  Transportation Needs:   . Lack of  Transportation (Medical): Not on file  . Lack of Transportation (Non-Medical): Not on file  Physical Activity:   . Days of Exercise per Week: Not on file  . Minutes of Exercise per Session: Not on file  Stress:   . Feeling of Stress : Not on file  Social Connections:   . Frequency of Communication with Friends and Family: Not on file  . Frequency of Social Gatherings with Friends and Family: Not on file  . Attends Religious Services: Not on file  . Active Member of Clubs or Organizations: Not on file  . Attends Archivist Meetings: Not on file  . Marital Status: Not on file  Intimate Partner Violence:   . Fear of Current or Ex-Partner: Not on file  . Emotionally Abused: Not on file  . Physically Abused: Not on file  . Sexually Abused: Not on file    Outpatient Medications Prior to Visit  Medication Sig Dispense Refill  . blood glucose meter kit and supplies KIT Dispense based on patient and insurance preference. Use up to four times daily as directed. (FOR ICD-9 250.00, 250.01). E11.9 1 each 0  . budesonide (ENTOCORT EC) 3 MG 24  hr capsule Take 3 mg by mouth 2 (two) times daily.    . cetirizine (ZYRTEC) 10 MG tablet Take 1 tablet (10 mg total) by mouth at bedtime. 30 tablet 11  . Clobetasol Prop Emollient Base 0.05 % emollient cream Apply 1 application topically 2 (two) times daily. 60 g 2  . CONTOUR NEXT TEST test strip CHECK BLOOD SUGAR UP TO 4 TIMES A DAY OR AS DIRECTED 400 each 2  . famotidine (PEPCID) 20 MG tablet Take 1 tablet (20 mg total) by mouth 2 (two) times daily. 60 tablet 5  . fluticasone (FLONASE) 50 MCG/ACT nasal spray Place 2 sprays into both nostrils daily. Sinus Allergies 16 g 11  . loratadine (CLARITIN) 10 MG tablet Take 1 tablet (10 mg total) by mouth daily. 30 tablet 11  . magnesium 30 MG tablet Take 30 mg by mouth 2 (two) times daily.    . metFORMIN (GLUCOPHAGE) 500 MG tablet TAKE (1) TABLET TWICE A DAY. 60 tablet 11  . mycophenolate (MYFORTIC) 180  MG EC tablet Take by mouth.    . pantoprazole (PROTONIX) 40 MG tablet Take 1 tablet (40 mg total) by mouth daily. 30 tablet 11  . promethazine (PHENERGAN) 25 MG tablet Take 1 tablet (25 mg total) by mouth every 8 (eight) hours as needed for nausea or vomiting. 20 tablet 0  . vitamin B-12 (CYANOCOBALAMIN) 1000 MCG tablet Take 1,000 mcg by mouth daily.    . vitamin E 1000 UNIT capsule Take 1,000 Units by mouth daily.    . cephALEXin (KEFLEX) 500 MG capsule Take 1 capsule (500 mg total) by mouth 4 (four) times daily. 40 capsule 0   No facility-administered medications prior to visit.    Allergies  Allergen Reactions  . Fenofibrate Micronized Other (See Comments)    Caused increased LFT's Caused increased LFT's  . Codeine   . Hydrocodone Nausea And Vomiting and Other (See Comments)    Makes body hot.   . Hydrocodone-Acetaminophen Nausea And Vomiting    Review of Systems  Constitutional: Negative.   HENT: Negative.   Eyes: Negative.   Respiratory: Negative.   Gastrointestinal: Negative.   Genitourinary: Positive for difficulty urinating, dysuria, flank pain, frequency and urgency.       Objective:    Physical Exam Constitutional:      Appearance: She is well-developed.  HENT:     Head: Normocephalic and atraumatic.  Eyes:     Conjunctiva/sclera: Conjunctivae normal.     Pupils: Pupils are equal, round, and reactive to light.  Cardiovascular:     Rate and Rhythm: Normal rate and regular rhythm.     Heart sounds: Normal heart sounds.  Pulmonary:     Effort: Pulmonary effort is normal.     Breath sounds: Normal breath sounds.  Abdominal:     General: Bowel sounds are normal. There is no distension.     Palpations: Abdomen is soft. There is no mass.     Tenderness: There is abdominal tenderness in the suprapubic area. There is no guarding or rebound.  Skin:    General: Skin is warm and dry.     Findings: No rash.  Neurological:     Mental Status: She is alert and  oriented to person, place, and time.     Deep Tendon Reflexes: Reflexes are normal and symmetric.  Psychiatric:        Behavior: Behavior normal.        Thought Content: Thought content normal.  Judgment: Judgment normal.     BP (!) 154/94   Pulse 84   Temp 98.2 F (36.8 C) (Temporal)   Ht '5\' 5"'  (1.651 m)   Wt 219 lb 6.4 oz (99.5 kg)   SpO2 98%   BMI 36.51 kg/m  Wt Readings from Last 3 Encounters:  05/02/19 219 lb 6.4 oz (99.5 kg)  01/22/19 213 lb 9.6 oz (96.9 kg)  10/12/18 206 lb 6.4 oz (93.6 kg)    Health Maintenance Due  Topic Date Due  . Hepatitis C Screening  Sep 20, 1955  . FOOT EXAM  05/15/1966  . HIV Screening  05/16/1971  . TETANUS/TDAP  05/16/1975  . PAP SMEAR-Modifier  05/15/1977  . OPHTHALMOLOGY EXAM  08/13/2017  . COLONOSCOPY  10/04/2018  . INFLUENZA VACCINE  11/03/2018    There are no preventive care reminders to display for this patient.   Lab Results  Component Value Date   TSH 2.640 08/10/2018   Lab Results  Component Value Date   WBC 5.8 02/26/2019   HGB 14.7 02/26/2019   HCT 42.6 02/26/2019   MCV 93 02/26/2019   PLT 166 02/26/2019   Lab Results  Component Value Date   NA 137 02/26/2019   K 4.4 02/26/2019   CO2 21 02/26/2019   GLUCOSE 118 (H) 02/26/2019   BUN 15 02/26/2019   CREATININE 0.90 02/26/2019   BILITOT 0.4 02/26/2019   ALKPHOS 67 02/26/2019   AST 33 02/26/2019   ALT 50 (H) 02/26/2019   PROT 7.4 02/26/2019   ALBUMIN 4.1 02/26/2019   CALCIUM 9.7 02/26/2019   Lab Results  Component Value Date   CHOL 200 (H) 10/12/2018   Lab Results  Component Value Date   HDL 65 10/12/2018   Lab Results  Component Value Date   LDLCALC 107 (H) 10/12/2018   Lab Results  Component Value Date   TRIG 139 10/12/2018   Lab Results  Component Value Date   CHOLHDL 3.1 10/12/2018   Lab Results  Component Value Date   HGBA1C 7.6 (H) 02/26/2019       Assessment & Plan:   Problem List Items Addressed This Visit    None      Visit Diagnoses    Dysuria    -  Primary   Relevant Orders   Urine Culture   Urinalysis, Complete   Acute cystitis without hematuria           Meds ordered this encounter  Medications  . sulfamethoxazole-trimethoprim (BACTRIM DS) 800-160 MG tablet    Sig: Take 1 tablet by mouth 2 (two) times daily.    Dispense:  20 tablet    Refill:  0    Order Specific Question:   Supervising Provider    Answer:   [name] [[id number]]  . fluconazole (DIFLUCAN) 150 MG tablet    Sig: 1 po q week x 4 weeks    Dispense:  4 tablet    Refill:  0    Order Specific Question:   Supervising Provider    Answer:   [name] [[id number]]     Terald Sleeper, PA-C   Terald Sleeper PA-C [facility] [address]  [place], [place] [address] [phone]

## 2019-05-02 NOTE — Patient Instructions (Signed)
Urinary Tract Infection, Adult A urinary tract infection (UTI) is an infection of any part of the urinary tract. The urinary tract includes:  The kidneys.  The ureters.  The bladder.  The urethra. These organs make, store, and get rid of pee (urine) in the body. What are the causes? This is caused by germs (bacteria) in your genital area. These germs grow and cause swelling (inflammation) of your urinary tract. What increases the risk? You are more likely to develop this condition if:  You have a small, thin tube (catheter) to drain pee.  You cannot control when you pee or poop (incontinence).  You are female, and: ? You use these methods to prevent pregnancy:  A medicine that kills sperm (spermicide).  A device that blocks sperm (diaphragm). ? You have low levels of a female hormone (estrogen). ? You are pregnant.  You have genes that add to your risk.  You are sexually active.  You take antibiotic medicines.  You have trouble peeing because of: ? A prostate that is bigger than normal, if you are female. ? A blockage in the part of your body that drains pee from the bladder (urethra). ? A kidney stone. ? A nerve condition that affects your bladder (neurogenic bladder). ? Not getting enough to drink. ? Not peeing often enough.  You have other conditions, such as: ? Diabetes. ? A weak disease-fighting system (immune system). ? Sickle cell disease. ? Gout. ? Injury of the spine. What are the signs or symptoms? Symptoms of this condition include:  Needing to pee right away (urgently).  Peeing often.  Peeing small amounts often.  Pain or burning when peeing.  Blood in the pee.  Pee that smells bad or not like normal.  Trouble peeing.  Pee that is cloudy.  Fluid coming from the vagina, if you are female.  Pain in the belly or lower back. Other symptoms include:  Throwing up (vomiting).  No urge to eat.  Feeling mixed up (confused).  Being tired  and grouchy (irritable).  A fever.  Watery poop (diarrhea). How is this treated? This condition may be treated with:  Antibiotic medicine.  Other medicines.  Drinking enough water. Follow these instructions at home:  Medicines  Take over-the-counter and prescription medicines only as told by your doctor.  If you were prescribed an antibiotic medicine, take it as told by your doctor. Do not stop taking it even if you start to feel better. General instructions  Make sure you: ? Pee until your bladder is empty. ? Do not hold pee for a long time. ? Empty your bladder after sex. ? Wipe from front to back after pooping if you are a female. Use each tissue one time when you wipe.  Drink enough fluid to keep your pee pale yellow.  Keep all follow-up visits as told by your doctor. This is important. Contact a doctor if:  You do not get better after 1-2 days.  Your symptoms go away and then come back. Get help right away if:  You have very bad back pain.  You have very bad pain in your lower belly.  You have a fever.  You are sick to your stomach (nauseous).  You are throwing up. Summary  A urinary tract infection (UTI) is an infection of any part of the urinary tract.  This condition is caused by germs in your genital area.  There are many risk factors for a UTI. These include having a small, thin   tube to drain pee and not being able to control when you pee or poop.  Treatment includes antibiotic medicines for germs.  Drink enough fluid to keep your pee pale yellow. This information is not intended to replace advice given to you by your health care provider. Make sure you discuss any questions you have with your health care provider. Document Revised: 03/08/2018 Document Reviewed: 09/28/2017 Elsevier Patient Education  2020 [employer]  

## 2019-05-02 NOTE — Addendum Note (Signed)
Addended by: Remus Loffler on: 05/02/2019 05:11 PM   Modules accepted: Orders

## 2019-05-04 LAB — URINE CULTURE

## 2019-06-07 ENCOUNTER — Other Ambulatory Visit: Payer: Self-pay

## 2019-06-07 ENCOUNTER — Ambulatory Visit (INDEPENDENT_AMBULATORY_CARE_PROVIDER_SITE_OTHER): Payer: BC Managed Care – PPO | Admitting: Physician Assistant

## 2019-06-07 ENCOUNTER — Encounter: Payer: Self-pay | Admitting: Physician Assistant

## 2019-06-07 VITALS — BP 134/75 | HR 75 | Temp 99.1°F | Ht 65.0 in | Wt 216.0 lb

## 2019-06-07 DIAGNOSIS — R300 Dysuria: Secondary | ICD-10-CM

## 2019-06-07 DIAGNOSIS — Z01812 Encounter for preprocedural laboratory examination: Secondary | ICD-10-CM | POA: Diagnosis not present

## 2019-06-07 DIAGNOSIS — K754 Autoimmune hepatitis: Secondary | ICD-10-CM | POA: Diagnosis not present

## 2019-06-07 DIAGNOSIS — R1011 Right upper quadrant pain: Secondary | ICD-10-CM

## 2019-06-07 DIAGNOSIS — E119 Type 2 diabetes mellitus without complications: Secondary | ICD-10-CM

## 2019-06-07 DIAGNOSIS — Z20822 Contact with and (suspected) exposure to covid-19: Secondary | ICD-10-CM | POA: Diagnosis not present

## 2019-06-07 LAB — URINALYSIS, ROUTINE W REFLEX MICROSCOPIC
Bilirubin, UA: NEGATIVE
Glucose, UA: NEGATIVE
Ketones, UA: NEGATIVE
Nitrite, UA: NEGATIVE
Protein,UA: NEGATIVE
RBC, UA: NEGATIVE
Specific Gravity, UA: 1.025 (ref 1.005–1.030)
Urobilinogen, Ur: 0.2 mg/dL (ref 0.2–1.0)
pH, UA: 5.5 (ref 5.0–7.5)

## 2019-06-07 LAB — MICROSCOPIC EXAMINATION
Epithelial Cells (non renal): >10 /hpf — AB (ref 0–10)
Renal Epithel, UA: NONE SEEN /hpf

## 2019-06-07 LAB — BAYER DCA HB A1C WAIVED: HB A1C (BAYER DCA - WAIVED): 8.2 % — ABNORMAL HIGH (ref <7.0)

## 2019-06-07 MED ORDER — GLIPIZIDE 5 MG PO TABS: 2.5000 mg | ORAL_TABLET | Freq: Two times a day (BID) | ORAL | 3 refills | Status: DC

## 2019-06-09 LAB — CMP14+EGFR
ALT: 65 IU/L — ABNORMAL HIGH (ref 0–32)
AST: 48 IU/L — ABNORMAL HIGH (ref 0–40)
Albumin/Globulin Ratio: 1.5 (ref 1.2–2.2)
Albumin: 5 g/dL — ABNORMAL HIGH (ref 3.8–4.8)
Alkaline Phosphatase: 73 IU/L (ref 39–117)
BUN/Creatinine Ratio: 17 (ref 12–28)
BUN: 15 mg/dL (ref 8–27)
Bilirubin Total: 0.5 mg/dL (ref 0.0–1.2)
CO2: 21 mmol/L (ref 20–29)
Calcium: 10.2 mg/dL (ref 8.7–10.3)
Chloride: 96 mmol/L (ref 96–106)
Creatinine, Ser: 0.87 mg/dL (ref 0.57–1.00)
GFR calc Af Amer: 82 mL/min/{1.73_m2} (ref >59)
GFR calc non Af Amer: 71 mL/min/{1.73_m2} (ref >59)
Globulin, Total: 3.3 g/dL (ref 1.5–4.5)
Glucose: 147 mg/dL — ABNORMAL HIGH (ref 65–99)
Potassium: 3.9 mmol/L (ref 3.5–5.2)
Sodium: 137 mmol/L (ref 134–144)
Total Protein: 8.3 g/dL (ref 6.0–8.5)

## 2019-06-09 LAB — CBC WITH DIFFERENTIAL/PLATELET
Basophils Absolute: 0 10*3/uL (ref 0.0–0.2)
Basos: 1 %
EOS (ABSOLUTE): 0.1 10*3/uL (ref 0.0–0.4)
Eos: 2 %
Hematocrit: 45.3 % (ref 34.0–46.6)
Hemoglobin: 15.5 g/dL (ref 11.1–15.9)
Immature Grans (Abs): 0 10*3/uL (ref 0.0–0.1)
Immature Granulocytes: 1 %
Lymphocytes Absolute: 1.6 10*3/uL (ref 0.7–3.1)
Lymphs: 24 %
MCH: 31.8 pg (ref 26.6–33.0)
MCHC: 34.2 g/dL (ref 31.5–35.7)
MCV: 93 fL (ref 79–97)
Monocytes Absolute: 0.9 10*3/uL (ref 0.1–0.9)
Monocytes: 13 %
Neutrophils Absolute: 4.1 10*3/uL (ref 1.4–7.0)
Neutrophils: 59 %
Platelets: 194 10*3/uL (ref 150–450)
RBC: 4.88 x10E6/uL (ref 3.77–5.28)
RDW: 12.8 % (ref 11.7–15.4)
WBC: 6.8 10*3/uL (ref 3.4–10.8)

## 2019-06-09 LAB — IGG: IgG (Immunoglobin G), Serum: 4774 mg/dL — ABNORMAL HIGH (ref 586–1602)

## 2019-06-09 LAB — LIPID PANEL
Chol/HDL Ratio: 4.2 ratio (ref 0.0–4.4)
Cholesterol, Total: 245 mg/dL — ABNORMAL HIGH (ref 100–199)
HDL: 58 mg/dL (ref >39)
LDL Chol Calc (NIH): 151 mg/dL — ABNORMAL HIGH (ref 0–99)
Triglycerides: 201 mg/dL — ABNORMAL HIGH (ref 0–149)
VLDL Cholesterol Cal: 36 mg/dL (ref 5–40)

## 2019-06-09 LAB — TSH: TSH: 4.05 u[IU]/mL (ref 0.450–4.500)

## 2019-06-10 DIAGNOSIS — D120 Benign neoplasm of cecum: Secondary | ICD-10-CM | POA: Diagnosis not present

## 2019-06-10 DIAGNOSIS — E049 Nontoxic goiter, unspecified: Secondary | ICD-10-CM | POA: Diagnosis not present

## 2019-06-10 DIAGNOSIS — E781 Pure hyperglyceridemia: Secondary | ICD-10-CM | POA: Diagnosis not present

## 2019-06-10 DIAGNOSIS — E039 Hypothyroidism, unspecified: Secondary | ICD-10-CM | POA: Diagnosis not present

## 2019-06-10 DIAGNOSIS — K635 Polyp of colon: Secondary | ICD-10-CM | POA: Diagnosis not present

## 2019-06-10 DIAGNOSIS — E669 Obesity, unspecified: Secondary | ICD-10-CM | POA: Diagnosis not present

## 2019-06-10 DIAGNOSIS — Z1211 Encounter for screening for malignant neoplasm of colon: Secondary | ICD-10-CM | POA: Diagnosis not present

## 2019-06-10 DIAGNOSIS — D126 Benign neoplasm of colon, unspecified: Secondary | ICD-10-CM | POA: Diagnosis not present

## 2019-06-10 DIAGNOSIS — Z79899 Other long term (current) drug therapy: Secondary | ICD-10-CM | POA: Diagnosis not present

## 2019-06-10 DIAGNOSIS — Z7984 Long term (current) use of oral hypoglycemic drugs: Secondary | ICD-10-CM | POA: Diagnosis not present

## 2019-06-10 DIAGNOSIS — K754 Autoimmune hepatitis: Secondary | ICD-10-CM | POA: Diagnosis not present

## 2019-06-10 DIAGNOSIS — E119 Type 2 diabetes mellitus without complications: Secondary | ICD-10-CM | POA: Diagnosis not present

## 2019-06-10 DIAGNOSIS — I10 Essential (primary) hypertension: Secondary | ICD-10-CM | POA: Diagnosis not present

## 2019-06-10 DIAGNOSIS — K449 Diaphragmatic hernia without obstruction or gangrene: Secondary | ICD-10-CM | POA: Diagnosis not present

## 2019-06-10 DIAGNOSIS — M1990 Unspecified osteoarthritis, unspecified site: Secondary | ICD-10-CM | POA: Diagnosis not present

## 2019-06-10 DIAGNOSIS — D124 Benign neoplasm of descending colon: Secondary | ICD-10-CM | POA: Diagnosis not present

## 2019-06-10 DIAGNOSIS — D122 Benign neoplasm of ascending colon: Secondary | ICD-10-CM | POA: Diagnosis not present

## 2019-06-10 DIAGNOSIS — E1165 Type 2 diabetes mellitus with hyperglycemia: Secondary | ICD-10-CM | POA: Diagnosis not present

## 2019-06-10 DIAGNOSIS — Z8371 Family history of colonic polyps: Secondary | ICD-10-CM | POA: Diagnosis not present

## 2019-06-10 DIAGNOSIS — K640 First degree hemorrhoids: Secondary | ICD-10-CM | POA: Diagnosis not present

## 2019-06-10 DIAGNOSIS — Z6838 Body mass index (BMI) 38.0-38.9, adult: Secondary | ICD-10-CM | POA: Diagnosis not present

## 2019-06-10 HISTORY — PX: COLONOSCOPY: SHX174

## 2019-06-12 DIAGNOSIS — K754 Autoimmune hepatitis: Secondary | ICD-10-CM | POA: Diagnosis not present

## 2019-06-12 DIAGNOSIS — Z7984 Long term (current) use of oral hypoglycemic drugs: Secondary | ICD-10-CM | POA: Diagnosis not present

## 2019-06-12 DIAGNOSIS — E669 Obesity, unspecified: Secondary | ICD-10-CM | POA: Diagnosis not present

## 2019-06-12 DIAGNOSIS — Z6835 Body mass index (BMI) 35.0-35.9, adult: Secondary | ICD-10-CM | POA: Diagnosis not present

## 2019-06-12 DIAGNOSIS — E119 Type 2 diabetes mellitus without complications: Secondary | ICD-10-CM | POA: Diagnosis not present

## 2019-06-12 DIAGNOSIS — Z9049 Acquired absence of other specified parts of digestive tract: Secondary | ICD-10-CM | POA: Diagnosis not present

## 2019-06-12 DIAGNOSIS — K7581 Nonalcoholic steatohepatitis (NASH): Secondary | ICD-10-CM | POA: Diagnosis not present

## 2019-06-12 DIAGNOSIS — Z23 Encounter for immunization: Secondary | ICD-10-CM | POA: Diagnosis not present

## 2019-06-12 DIAGNOSIS — Z87891 Personal history of nicotine dependence: Secondary | ICD-10-CM | POA: Diagnosis not present

## 2019-06-13 NOTE — Progress Notes (Signed)
BP 134/75   Pulse 75   Temp 99.1 F (37.3 C)   Ht _0  (1.651 m)   Wt 216 lb (98 kg)   SpO2 97%   BMI 35.94 kg/m    Subjective:    Patient ID: Taylor Leach, female    DOB: 09-10-1955, 64 y.o.   MRN: [id number]  HPI 1. Diabetes mellitus without complication ([place])  2. Dysuria  3. RUQ pain  4. Autoimmune hepatitis ([place])   HPI: Taylor Leach is a 64 y.o. female presenting on 06/07/2019 for Medical Management of Chronic Issues, Diabetes, and Dysuria  This patient comes in for follow-up on her chronic medical conditions which do include diabetes, right upper quadrant pain, autoimmune hepatitis and she is having some dysuria at this time.  Her right upper quadrant pain is worse than it has been in some time.  She does have an appointment with her gastroenterologist in the next week.  She does need to have her lab work performed.  Her A1c was 8.2 today so we have discussed working on diet and exercise and medication changes and she agrees.  She also will have her regular labs performed today.  We have also placed the others on file for future orders.   Past Medical History:  Diagnosis Date  . Arthritis   . Autoimmune hepatitis ([place])   . Chest pain    Stress echo normal, October, 2012  . Dyslipidemia   . Ejection fraction    EF 55-60%, echo, October, 2012  . GERD (gastroesophageal reflux disease)    Relevant past medical, surgical, family and social history reviewed and updated as indicated. Interim medical history since our last visit reviewed. Allergies and medications reviewed and updated. DATA REVIEWED: CHART IN EPIC  Family History reviewed for pertinent findings.  Review of Systems  Constitutional: Negative.  Negative for activity change, fatigue and fever.  HENT: Negative.   Eyes: Negative.   Respiratory: Negative.  Negative for cough.   Cardiovascular: Negative.  Negative for chest pain.  Gastrointestinal: Positive for abdominal distention and abdominal pain.  Negative for constipation, diarrhea and nausea.  Endocrine: Negative.   Genitourinary: Negative.  Negative for dysuria.  Musculoskeletal: Negative.   Skin: Negative.   Neurological: Negative.     Allergies as of 06/07/2019      Reactions   Fenofibrate Micronized Other (See Comments)   Caused increased LFT's Caused increased LFT's   Codeine    Hydrocodone Nausea And Vomiting, Other (See Comments)   Makes body hot.    Hydrocodone-acetaminophen Nausea And Vomiting      Medication List       Accurate as of June 07, 2019 11:59 PM. If you have any questions, ask your nurse or doctor.        STOP taking these medications   fluconazole 150 MG tablet Commonly known as: Diflucan Stopped by: Terald Sleeper, PA-C   promethazine 25 MG tablet Commonly known as: PHENERGAN Stopped by: Terald Sleeper, PA-C   sulfamethoxazole-trimethoprim 800-160 MG tablet Commonly known as: Bactrim DS Stopped by: Terald Sleeper, PA-C     TAKE these medications   blood glucose meter kit and supplies Kit Dispense based on patient and insurance preference. Use up to four times daily as directed. (FOR ICD-9 250.00, 250.01). E11.9   budesonide 3 MG 24 hr capsule Commonly known as: ENTOCORT EC Take 3 mg by mouth 2 (two) times daily.   cetirizine 10 MG tablet Commonly known as: ZYRTEC  Take 1 tablet (10 mg total) by mouth at bedtime.   Clobetasol Prop Emollient Base 0.05 % emollient cream Apply 1 application topically 2 (two) times daily.   Contour Next Test test strip Generic drug: glucose blood CHECK BLOOD SUGAR UP TO 4 TIMES A DAY OR AS DIRECTED   famotidine 20 MG tablet Commonly known as: Pepcid Take 1 tablet (20 mg total) by mouth 2 (two) times daily.   fluticasone 50 MCG/ACT nasal spray Commonly known as: FLONASE Place 2 sprays into both nostrils daily. Sinus Allergies   glipiZIDE 5 MG tablet Commonly known as: GLUCOTROL Take 0.5 tablets (2.5 mg total) by mouth 2 (two) times daily before  a meal. Started by: Terald Sleeper, PA-C   hydrochlorothiazide 25 MG tablet Commonly known as: HYDRODIURIL Take 1 tablet (25 mg total) by mouth daily.   loratadine 10 MG tablet Commonly known as: CLARITIN Take 1 tablet (10 mg total) by mouth daily.   magnesium 30 MG tablet Take 30 mg by mouth 2 (two) times daily.   metFORMIN 500 MG tablet Commonly known as: GLUCOPHAGE TAKE (1) TABLET TWICE A DAY.   mycophenolate 180 MG EC tablet Commonly known as: MYFORTIC Take by mouth.   pantoprazole 40 MG tablet Commonly known as: PROTONIX Take 1 tablet (40 mg total) by mouth daily.   vitamin B-12 1000 MCG tablet Commonly known as: CYANOCOBALAMIN Take 1,000 mcg by mouth daily.   vitamin E 1000 UNIT capsule Take 1,000 Units by mouth daily.          Objective:    BP 134/75   Pulse 75   Temp 99.1 F (37.3 C)   Ht _0  (1.651 m)   Wt 216 lb (98 kg)   SpO2 97%   BMI 35.94 kg/m   Allergies  Allergen Reactions  . Fenofibrate Micronized Other (See Comments)    Caused increased LFT's Caused increased LFT's  . Codeine   . Hydrocodone Nausea And Vomiting and Other (See Comments)    Makes body hot.   . Hydrocodone-Acetaminophen Nausea And Vomiting    Wt Readings from Last 3 Encounters:  06/07/19 216 lb (98 kg)  05/02/19 219 lb 6.4 oz (99.5 kg)  01/22/19 213 lb 9.6 oz (96.9 kg)    Physical Exam Constitutional:      General: She is not in acute distress.    Appearance: Normal appearance. She is well-developed.  HENT:     Head: Normocephalic and atraumatic.  Cardiovascular:     Rate and Rhythm: Normal rate.  Pulmonary:     Effort: Pulmonary effort is normal.  Skin:    General: Skin is warm and dry.     Findings: No rash.  Neurological:     Mental Status: She is alert and oriented to person, place, and time.     Deep Tendon Reflexes: Reflexes are normal and symmetric.     Results for orders placed or performed in visit on 06/07/19  Microscopic Examination    URINE  Result Value Ref Range   WBC, UA 6-10 (A) 0 - 5 /hpf   RBC 0-2 0 - 2 /hpf   Epithelial Cells (non renal) >10 (A) 0 - 10 /hpf   Renal Epithel, UA None seen None seen /hpf   Bacteria, UA Few None seen/Few  Urinalysis, Routine w reflex microscopic  Result Value Ref Range   Specific Gravity, UA 1.025 1.005 - 1.030   pH, UA 5.5 5.0 - 7.5   Color, UA Yellow Yellow  Appearance Ur Clear Clear   Leukocytes,UA Trace (A) Negative   Protein,UA Negative Negative/Trace   Glucose, UA Negative Negative   Ketones, UA Negative Negative   RBC, UA Negative Negative   Bilirubin, UA Negative Negative   Urobilinogen, Ur 0.2 0.2 - 1.0 mg/dL   Nitrite, UA Negative Negative   Microscopic Examination See below:   Bayer DCA Hb A1c Waived  Result Value Ref Range   HB A1C (BAYER DCA - WAIVED) 8.2 (H) <7.0 %  TSH  Result Value Ref Range   TSH 4.050 0.450 - 4.500 uIU/mL  Lipid panel  Result Value Ref Range   Cholesterol, Total 245 (H) 100 - 199 mg/dL   Triglycerides 201 (H) 0 - 149 mg/dL   HDL 58 >39 mg/dL   VLDL Cholesterol Cal 36 5 - 40 mg/dL   LDL Chol Calc (NIH) 151 (H) 0 - 99 mg/dL   Chol/HDL Ratio 4.2 0.0 - 4.4 ratio  CMP14+EGFR  Result Value Ref Range   Glucose 147 (H) 65 - 99 mg/dL   BUN 15 8 - 27 mg/dL   Creatinine, Ser 0.87 0.57 - 1.00 mg/dL   GFR calc non Af Amer 71 >59 mL/min/1.73   GFR calc Af Amer 82 >59 mL/min/1.73   BUN/Creatinine Ratio 17 12 - 28   Sodium 137 134 - 144 mmol/L   Potassium 3.9 3.5 - 5.2 mmol/L   Chloride 96 96 - 106 mmol/L   CO2 21 20 - 29 mmol/L   Calcium 10.2 8.7 - 10.3 mg/dL   Total Protein 8.3 6.0 - 8.5 g/dL   Albumin 5.0 (H) 3.8 - 4.8 g/dL   Globulin, Total 3.3 1.5 - 4.5 g/dL   Albumin/Globulin Ratio 1.5 1.2 - 2.2   Bilirubin Total 0.5 0.0 - 1.2 mg/dL   Alkaline Phosphatase 73 39 - 117 IU/L   AST 48 (H) 0 - 40 IU/L   ALT 65 (H) 0 - 32 IU/L  CBC with Differential/Platelet  Result Value Ref Range   WBC 6.8 3.4 - 10.8 x10E3/uL   RBC 4.88 3.77 -  5.28 x10E6/uL   Hemoglobin 15.5 11.1 - 15.9 g/dL   Hematocrit 45.3 34.0 - 46.6 %   MCV 93 79 - 97 fL   MCH 31.8 26.6 - 33.0 pg   MCHC 34.2 31.5 - 35.7 g/dL   RDW 12.8 11.7 - 15.4 %   Platelets 194 150 - 450 x10E3/uL   Neutrophils 59 Not Estab. %   Lymphs 24 Not Estab. %   Monocytes 13 Not Estab. %   Eos 2 Not Estab. %   Basos 1 Not Estab. %   Neutrophils Absolute 4.1 1.4 - 7.0 x10E3/uL   Lymphocytes Absolute 1.6 0.7 - 3.1 x10E3/uL   Monocytes Absolute 0.9 0.1 - 0.9 x10E3/uL   EOS (ABSOLUTE) 0.1 0.0 - 0.4 x10E3/uL   Basophils Absolute 0.0 0.0 - 0.2 x10E3/uL   Immature Granulocytes 1 Not Estab. %   Immature Grans (Abs) 0.0 0.0 - 0.1 x10E3/uL  IGG  Result Value Ref Range   IgG (Immunoglobin G), Serum 4,774 (H) 586 - 1,602 mg/dL      Assessment & Plan:   1. Diabetes mellitus without complication ([facility]) - Bayer DCA Hb A1c Waived - glipiZIDE (GLUCOTROL) 5 MG tablet; Take 0.5 tablets (2.5 mg total) by mouth 2 (two) times daily before a meal.  Dispense: 60 tablet; Refill: 3 - TSH - Lipid panel - CMP14+EGFR - CBC with Differential/Platelet - IGG  2. Dysuria - Urinalysis,  Routine w reflex microscopic - Microscopic Examination  3. RUQ pain  4. Autoimmune hepatitis ([place]) - TSH - Lipid panel - CMP14+EGFR - CBC with Differential/Platelet - IGG   Continue all other maintenance medications as listed above.  Follow up plan: Return in about 3 months (around 09/07/2019).  Educational handout given for [place] PA-C [facility] [address]  [place], [place] [address] [phone]   06/13/2019, 7:40 PM

## 2019-06-27 ENCOUNTER — Ambulatory Visit: Payer: BC Managed Care – PPO | Admitting: Physician Assistant

## 2019-07-08 ENCOUNTER — Ambulatory Visit: Payer: BC Managed Care – PPO | Admitting: Family Medicine

## 2019-07-16 ENCOUNTER — Ambulatory Visit (INDEPENDENT_AMBULATORY_CARE_PROVIDER_SITE_OTHER): Payer: BC Managed Care – PPO

## 2019-07-16 ENCOUNTER — Ambulatory Visit (INDEPENDENT_AMBULATORY_CARE_PROVIDER_SITE_OTHER): Payer: BC Managed Care – PPO | Admitting: Family Medicine

## 2019-07-16 ENCOUNTER — Encounter: Payer: Self-pay | Admitting: Family Medicine

## 2019-07-16 ENCOUNTER — Other Ambulatory Visit: Payer: Self-pay

## 2019-07-16 VITALS — BP 137/84 | HR 67 | Temp 96.3°F | Ht 65.0 in | Wt 218.0 lb

## 2019-07-16 DIAGNOSIS — R42 Dizziness and giddiness: Secondary | ICD-10-CM

## 2019-07-16 DIAGNOSIS — R609 Edema, unspecified: Secondary | ICD-10-CM

## 2019-07-16 DIAGNOSIS — R002 Palpitations: Secondary | ICD-10-CM

## 2019-07-16 NOTE — Patient Instructions (Signed)
Palpitations Palpitations are feelings that your heartbeat is not normal. Your heartbeat may feel like it is:  Uneven.  Faster than normal.  Fluttering.  Skipping a beat. This is usually not a serious problem. In some cases, you may need tests to rule out any serious problems. Follow these instructions at home: Pay attention to any changes in your condition. Take these actions to help manage your symptoms: Eating and drinking  Avoid: ? Coffee, tea, soft drinks, and energy drinks. ? Chocolate. ? Alcohol. ? Diet pills. Lifestyle   Try to lower your stress. These things can help you relax: ? Yoga. ? Deep breathing and meditation. ? Exercise. ? Using words and images to create positive thoughts (guided imagery). ? Using your mind to control things in your body (biofeedback).  Do not use drugs.  Get plenty of rest and sleep. Keep a regular bed time. General instructions   Take over-the-counter and prescription medicines only as told by your doctor.  Do not use any products that contain nicotine or tobacco, such as cigarettes and e-cigarettes. If you need help quitting, ask your doctor.  Keep all follow-up visits as told by your doctor. This is important. You may need more tests if palpitations do not go away or get worse. Contact a doctor if:  Your symptoms last more than 24 hours.  Your symptoms occur more often. Get help right away if you:  Have chest pain.  Feel short of breath.  Have a very bad headache.  Feel dizzy.  Pass out (faint). Summary  Palpitations are feelings that your heartbeat is uneven or faster than normal. It may feel like your heart is fluttering or skipping a beat.  Avoid food and drinks that may cause palpitations. These include caffeine, chocolate, and alcohol.  Try to lower your stress. Do not smoke or use drugs.  Get help right away if you faint or have chest pain, shortness of breath, a severe headache, or dizziness. This  information is not intended to replace advice given to you by your health care provider. Make sure you discuss any questions you have with your health care provider. Document Revised: 05/03/2017 Document Reviewed: 05/03/2017 [employer] Patient Education  2020 [employer]  

## 2019-07-16 NOTE — Progress Notes (Signed)
Subjective:  Patient ID: Taylor Leach, female    DOB: 09/26/1955, 64 y.o.   MRN: [id number]  Patient Care Team: [name], DO as PCP - General (Family Medicine)   Chief Complaint:  Heart palpatations (occasional night sweats. No HA's, N&V)   HPI: Taylor Leach is a 64 y.o. female presenting on 07/16/2019 for Heart palpatations (occasional night sweats. No HA's, N&V)   Pt presents today for intermittent palpitations. States this started about 1 month ago. She denies chest pain, SHOB, nausea, vomiting, weakness, dizziness, or syncope with the palpitations. States she has gained weight and feels like her hands and feet are swollen at times. She has noticed night sweats several times per week. She denies orthopnea, PND, DOE, or confusion. She denies excessive caffeine use. No reported anxiety or increased stress.     Relevant past medical, surgical, family, and social history reviewed and updated as indicated.  Allergies and medications reviewed and updated. Date reviewed: Chart in Epic.   Past Medical History:  Diagnosis Date  . Arthritis   . Autoimmune hepatitis ([place])   . Chest pain    Stress echo normal, October, 2012  . Dyslipidemia   . Ejection fraction    EF 55-60%, echo, October, 2012  . GERD (gastroesophageal reflux disease)     Past Surgical History:  Procedure Laterality Date  . CHOLECYSTECTOMY    . NASAL SINUS SURGERY    . VAGINAL HYSTERECTOMY      Social History   Socioeconomic History  . Marital status: Married    Spouse name: Not on file  . Number of children: Not on file  . Years of education: Not on file  . Highest education level: Not on file  Occupational History  . Not on file  Tobacco Use  . Smoking status: Former Smoker    Packs/day: 1.00  . Smokeless tobacco: Never Used  Substance and Sexual Activity  . Alcohol use: Yes    Alcohol/week: 1.0 standard drinks    Types: 1 Glasses of wine per week  . Drug use: No  . Sexual activity:  Yes    Birth control/protection: Surgical  Other Topics Concern  . Not on file  Social History Narrative  . Not on file   Social Determinants of Health   Financial Resource Strain:   . Difficulty of Paying Living Expenses:   Food Insecurity:   . Worried About Charity fundraiser in the Last Year:   . Arboriculturist in the Last Year:   Transportation Needs:   . Film/video editor (Medical):   Marland Kitchen Lack of Transportation (Non-Medical):   Physical Activity:   . Days of Exercise per Week:   . Minutes of Exercise per Session:   Stress:   . Feeling of Stress :   Social Connections:   . Frequency of Communication with Friends and Family:   . Frequency of Social Gatherings with Friends and Family:   . Attends Religious Services:   . Active Member of Clubs or Organizations:   . Attends Archivist Meetings:   Marland Kitchen Marital Status:   Intimate Partner Violence:   . Fear of Current or Ex-Partner:   . Emotionally Abused:   Marland Kitchen Physically Abused:   . Sexually Abused:     Outpatient Encounter Medications as of 07/16/2019  Medication Sig  . blood glucose meter kit and supplies KIT Dispense based on patient and insurance preference. Use up to four times  daily as directed. (FOR ICD-9 250.00, 250.01). E11.9  . budesonide (ENTOCORT EC) 3 MG 24 hr capsule Take 3 mg by mouth 2 (two) times daily.  . cetirizine (ZYRTEC) 10 MG tablet Take 1 tablet (10 mg total) by mouth at bedtime.  . Clobetasol Prop Emollient Base 0.05 % emollient cream Apply 1 application topically 2 (two) times daily.  . CONTOUR NEXT TEST test strip CHECK BLOOD SUGAR UP TO 4 TIMES A DAY OR AS DIRECTED  . famotidine (PEPCID) 20 MG tablet Take 1 tablet (20 mg total) by mouth 2 (two) times daily.  . fluticasone (FLONASE) 50 MCG/ACT nasal spray Place 2 sprays into both nostrils daily. Sinus Allergies  . glipiZIDE (GLUCOTROL) 5 MG tablet Take 0.5 tablets (2.5 mg total) by mouth 2 (two) times daily before a meal.  .  hydrochlorothiazide (HYDRODIURIL) 25 MG tablet Take 1 tablet (25 mg total) by mouth daily.  Marland Kitchen loratadine (CLARITIN) 10 MG tablet Take 1 tablet (10 mg total) by mouth daily.  . magnesium 30 MG tablet Take 30 mg by mouth 2 (two) times daily.  . metFORMIN (GLUCOPHAGE) 500 MG tablet TAKE (1) TABLET TWICE A DAY.  . mycophenolate (MYFORTIC) 180 MG EC tablet Take by mouth.  . pantoprazole (PROTONIX) 40 MG tablet Take 1 tablet (40 mg total) by mouth daily.  . [DISCONTINUED] vitamin B-12 (CYANOCOBALAMIN) 1000 MCG tablet Take 1,000 mcg by mouth daily.  . [DISCONTINUED] vitamin E 1000 UNIT capsule Take 1,000 Units by mouth daily.   No facility-administered encounter medications on file as of 07/16/2019.    Allergies  Allergen Reactions  . Fenofibrate Micronized Other (See Comments)    Caused increased LFT's Caused increased LFT's  . Codeine   . Hydrocodone Nausea And Vomiting and Other (See Comments)    Makes body hot.   . Hydrocodone-Acetaminophen Nausea And Vomiting    Review of Systems  Constitutional: Positive for unexpected weight change. Negative for activity change, appetite change, chills, diaphoresis (intermittent night sweats), fatigue and fever.  HENT: Negative.   Eyes: Negative.  Negative for photophobia and visual disturbance.  Respiratory: Negative for cough, chest tightness and shortness of breath.   Cardiovascular: Positive for palpitations and leg swelling. Negative for chest pain.  Gastrointestinal: Negative for abdominal distention, abdominal pain, constipation, diarrhea, nausea and vomiting.  Endocrine: Positive for heat intolerance. Negative for cold intolerance, polydipsia, polyphagia and polyuria.  Genitourinary: Negative for decreased urine volume, difficulty urinating, dysuria, frequency and urgency.  Musculoskeletal: Negative for arthralgias and myalgias.  Skin: Negative.   Allergic/Immunologic: Negative.   Neurological: Positive for light-headedness. Negative for  dizziness, tremors, seizures, syncope, facial asymmetry, speech difficulty, weakness, numbness and headaches.  Hematological: Negative.   Psychiatric/Behavioral: Negative for confusion, hallucinations, sleep disturbance and suicidal ideas.  All other systems reviewed and are negative.       Objective:  BP 137/84   Pulse 67   Temp (!) 96.3 F (35.7 C)   Ht '5\' 5"'  (1.651 m)   Wt 218 lb (98.9 kg)   SpO2 100%   BMI 36.28 kg/m    Wt Readings from Last 3 Encounters:  07/16/19 218 lb (98.9 kg)  06/07/19 216 lb (98 kg)  05/02/19 219 lb 6.4 oz (99.5 kg)    Physical Exam Vitals and nursing note reviewed.  Constitutional:      General: She is not in acute distress.    Appearance: Normal appearance. She is well-developed and well-groomed. She is obese. She is not ill-appearing, toxic-appearing or diaphoretic.  HENT:     Head: Normocephalic and atraumatic.     Jaw: There is normal jaw occlusion.     Right Ear: Hearing normal.     Left Ear: Hearing normal.     Nose: Nose normal.     Mouth/Throat:     Lips: Pink.     Mouth: Mucous membranes are moist.     Pharynx: Oropharynx is clear. Uvula midline.  Eyes:     General: Lids are normal.     Extraocular Movements: Extraocular movements intact.     Conjunctiva/sclera: Conjunctivae normal.     Pupils: Pupils are equal, round, and reactive to light.  Neck:     Thyroid: No thyroid mass, thyromegaly or thyroid tenderness.     Vascular: No carotid bruit or JVD.     Trachea: Trachea and phonation normal.  Cardiovascular:     Rate and Rhythm: Normal rate and regular rhythm.     Chest Wall: PMI is not displaced.     Pulses: Normal pulses.     Heart sounds: Normal heart sounds. No murmur. No friction rub. No gallop. No S3 sounds.   Pulmonary:     Effort: Pulmonary effort is normal. No respiratory distress.     Breath sounds: Normal breath sounds. No wheezing, rhonchi or rales.  Abdominal:     General: Bowel sounds are normal. There is  no distension or abdominal bruit.     Palpations: Abdomen is soft. There is no hepatomegaly or splenomegaly.     Tenderness: There is no abdominal tenderness. There is no right CVA tenderness or left CVA tenderness.     Hernia: No hernia is present.  Musculoskeletal:        General: Normal range of motion.     Cervical back: Normal range of motion and neck supple.     Right lower leg: Edema (trace) present.     Left lower leg: Edema (trace) present.  Lymphadenopathy:     Cervical: No cervical adenopathy.  Skin:    General: Skin is warm and dry.     Capillary Refill: Capillary refill takes less than 2 seconds.     Coloration: Skin is not cyanotic, jaundiced or pale.     Findings: No rash.  Neurological:     General: No focal deficit present.     Mental Status: She is alert and oriented to person, place, and time.     Cranial Nerves: Cranial nerves are intact. No cranial nerve deficit.     Sensory: Sensation is intact. No sensory deficit.     Motor: No weakness.     Coordination: Coordination is intact. Coordination normal.     Gait: Gait is intact. Gait normal.     Deep Tendon Reflexes: Reflexes are normal and symmetric. Reflexes normal.  Psychiatric:        Attention and Perception: Attention and perception normal.        Mood and Affect: Mood and affect normal.        Speech: Speech normal.        Behavior: Behavior normal. Behavior is cooperative.        Thought Content: Thought content normal.        Cognition and Memory: Cognition and memory normal.        Judgment: Judgment normal.     Results for orders placed or performed in visit on 06/07/19  Microscopic Examination   URINE  Result Value Ref Range   WBC, UA 6-10 (A) 0 -  5 /hpf   RBC 0-2 0 - 2 /hpf   Epithelial Cells (non renal) >10 (A) 0 - 10 /hpf   Renal Epithel, UA None seen None seen /hpf   Bacteria, UA Few None seen/Few  Urinalysis, Routine w reflex microscopic  Result Value Ref Range   Specific Gravity, UA  1.025 1.005 - 1.030   pH, UA 5.5 5.0 - 7.5   Color, UA Yellow Yellow   Appearance Ur Clear Clear   Leukocytes,UA Trace (A) Negative   Protein,UA Negative Negative/Trace   Glucose, UA Negative Negative   Ketones, UA Negative Negative   RBC, UA Negative Negative   Bilirubin, UA Negative Negative   Urobilinogen, Ur 0.2 0.2 - 1.0 mg/dL   Nitrite, UA Negative Negative   Microscopic Examination See below:   Bayer DCA Hb A1c Waived  Result Value Ref Range   HB A1C (BAYER DCA - WAIVED) 8.2 (H) <7.0 %  TSH  Result Value Ref Range   TSH 4.050 0.450 - 4.500 uIU/mL  Lipid panel  Result Value Ref Range   Cholesterol, Total 245 (H) 100 - 199 mg/dL   Triglycerides 201 (H) 0 - 149 mg/dL   HDL 58 >39 mg/dL   VLDL Cholesterol Cal 36 5 - 40 mg/dL   LDL Chol Calc (NIH) 151 (H) 0 - 99 mg/dL   Chol/HDL Ratio 4.2 0.0 - 4.4 ratio  CMP14+EGFR  Result Value Ref Range   Glucose 147 (H) 65 - 99 mg/dL   BUN 15 8 - 27 mg/dL   Creatinine, Ser 0.87 0.57 - 1.00 mg/dL   GFR calc non Af Amer 71 >59 mL/min/1.73   GFR calc Af Amer 82 >59 mL/min/1.73   BUN/Creatinine Ratio 17 12 - 28   Sodium 137 134 - 144 mmol/L   Potassium 3.9 3.5 - 5.2 mmol/L   Chloride 96 96 - 106 mmol/L   CO2 21 20 - 29 mmol/L   Calcium 10.2 8.7 - 10.3 mg/dL   Total Protein 8.3 6.0 - 8.5 g/dL   Albumin 5.0 (H) 3.8 - 4.8 g/dL   Globulin, Total 3.3 1.5 - 4.5 g/dL   Albumin/Globulin Ratio 1.5 1.2 - 2.2   Bilirubin Total 0.5 0.0 - 1.2 mg/dL   Alkaline Phosphatase 73 39 - 117 IU/L   AST 48 (H) 0 - 40 IU/L   ALT 65 (H) 0 - 32 IU/L  CBC with Differential/Platelet  Result Value Ref Range   WBC 6.8 3.4 - 10.8 x10E3/uL   RBC 4.88 3.77 - 5.28 x10E6/uL   Hemoglobin 15.5 11.1 - 15.9 g/dL   Hematocrit 45.3 34.0 - 46.6 %   MCV 93 79 - 97 fL   MCH 31.8 26.6 - 33.0 pg   MCHC 34.2 31.5 - 35.7 g/dL   RDW 12.8 11.7 - 15.4 %   Platelets 194 150 - 450 x10E3/uL   Neutrophils 59 Not Estab. %   Lymphs 24 Not Estab. %   Monocytes 13 Not Estab. %     Eos 2 Not Estab. %   Basos 1 Not Estab. %   Neutrophils Absolute 4.1 1.4 - 7.0 x10E3/uL   Lymphocytes Absolute 1.6 0.7 - 3.1 x10E3/uL   Monocytes Absolute 0.9 0.1 - 0.9 x10E3/uL   EOS (ABSOLUTE) 0.1 0.0 - 0.4 x10E3/uL   Basophils Absolute 0.0 0.0 - 0.2 x10E3/uL   Immature Granulocytes 1 Not Estab. %   Immature Grans (Abs) 0.0 0.0 - 0.1 x10E3/uL  IGG  Result Value Ref Range  IgG (Immunoglobin G), Serum 4,774 (H) 586 - 1,602 mg/dL     X-Ray: CXR: No acute findings. Preliminary x-ray reading by Monia Pouch, FNP-C, [facility]. EKG: SR without ectopy or acute ST-T changes. No significant changes from previous EKG. Monia Pouch, FNP-C  Pertinent labs & imaging results that were available during my care of the patient were reviewed by me and considered in my medical decision making.  Assessment & Plan:  Taylor Leach was seen today for heart palpatations.  Diagnoses and all orders for this visit:  Palpitation Light headedness Fluid retention Intermittent palpitations, light headedness, and fluid retention. EKG in office without acute changes. CXR normal. Labs pending. Will refer to cardiology for evaluation and treatment.  -     EKG 12-Lead -     CBC with Differential/Platelet -     CMP14+EGFR -     Thyroid Panel With TSH -     DG Chest 2 View; Future -     Ambulatory referral to Cardiology     Continue all other maintenance medications.  Follow up plan: Return if symptoms worsen or fail to improve.   Continue healthy lifestyle choices, including diet (rich in fruits, vegetables, and lean proteins, and low in salt and simple carbohydrates) and exercise (at least 30 minutes of moderate physical activity daily).  Educational handout given for palpitations  The above assessment and management plan was discussed with the patient. The patient verbalized understanding of and has agreed to the management plan. Patient is aware to call the clinic if they develop any new symptoms or if  symptoms persist or worsen. Patient is aware when to return to the clinic for a follow-up visit. Patient educated on when it is appropriate to go to the emergency department.   Monia Pouch, FNP-C [facility] [phone]

## 2019-07-17 LAB — CBC WITH DIFFERENTIAL/PLATELET
Basophils Absolute: 0 10*3/uL (ref 0.0–0.2)
Basos: 1 %
EOS (ABSOLUTE): 0.1 10*3/uL (ref 0.0–0.4)
Eos: 1 %
Hematocrit: 44.1 % (ref 34.0–46.6)
Hemoglobin: 15.2 g/dL (ref 11.1–15.9)
Immature Grans (Abs): 0 10*3/uL (ref 0.0–0.1)
Immature Granulocytes: 1 %
Lymphocytes Absolute: 1.3 10*3/uL (ref 0.7–3.1)
Lymphs: 18 %
MCH: 32.5 pg (ref 26.6–33.0)
MCHC: 34.5 g/dL (ref 31.5–35.7)
MCV: 94 fL (ref 79–97)
Monocytes Absolute: 0.8 10*3/uL (ref 0.1–0.9)
Monocytes: 10 %
Neutrophils Absolute: 5 10*3/uL (ref 1.4–7.0)
Neutrophils: 69 %
Platelets: 171 10*3/uL (ref 150–450)
RBC: 4.67 x10E6/uL (ref 3.77–5.28)
RDW: 12.9 % (ref 11.7–15.4)
WBC: 7.3 10*3/uL (ref 3.4–10.8)

## 2019-07-17 LAB — CMP14+EGFR
ALT: 52 IU/L — ABNORMAL HIGH (ref 0–32)
AST: 35 IU/L (ref 0–40)
Albumin/Globulin Ratio: 1.1 — ABNORMAL LOW (ref 1.2–2.2)
Albumin: 4.1 g/dL (ref 3.8–4.8)
Alkaline Phosphatase: 60 IU/L (ref 39–117)
BUN/Creatinine Ratio: 15 (ref 12–28)
BUN: 13 mg/dL (ref 8–27)
Bilirubin Total: 0.3 mg/dL (ref 0.0–1.2)
CO2: 22 mmol/L (ref 20–29)
Calcium: 9.5 mg/dL (ref 8.7–10.3)
Chloride: 101 mmol/L (ref 96–106)
Creatinine, Ser: 0.85 mg/dL (ref 0.57–1.00)
GFR calc Af Amer: 84 mL/min/{1.73_m2} (ref >59)
GFR calc non Af Amer: 73 mL/min/{1.73_m2} (ref >59)
Globulin, Total: 3.7 g/dL (ref 1.5–4.5)
Glucose: 90 mg/dL (ref 65–99)
Potassium: 3.8 mmol/L (ref 3.5–5.2)
Sodium: 141 mmol/L (ref 134–144)
Total Protein: 7.8 g/dL (ref 6.0–8.5)

## 2019-07-17 LAB — THYROID PANEL WITH TSH
Free Thyroxine Index: 2.4 (ref 1.2–4.9)
T3 Uptake Ratio: 29 % (ref 24–39)
T4, Total: 8.3 ug/dL (ref 4.5–12.0)
TSH: 1.99 u[IU]/mL (ref 0.450–4.500)

## 2019-07-17 NOTE — Progress Notes (Signed)
Degenerative changes of thoracic spine. No cardiopulmonary abnormalities.

## 2019-07-17 NOTE — Progress Notes (Signed)
CBC and thyroid function are normal.  ALT is slightly elevated. Limit intake of tylenol and alcohol. Lower than previous results. Will continue to monitor.  Glucose and renal function normal.

## 2019-08-06 ENCOUNTER — Encounter: Payer: Self-pay | Admitting: Cardiology

## 2019-08-06 ENCOUNTER — Ambulatory Visit (INDEPENDENT_AMBULATORY_CARE_PROVIDER_SITE_OTHER): Payer: BC Managed Care – PPO | Admitting: Cardiology

## 2019-08-06 ENCOUNTER — Telehealth: Payer: Self-pay | Admitting: Cardiology

## 2019-08-06 ENCOUNTER — Other Ambulatory Visit: Payer: Self-pay

## 2019-08-06 VITALS — BP 134/83 | HR 70 | Ht 65.0 in | Wt 220.0 lb

## 2019-08-06 DIAGNOSIS — R002 Palpitations: Secondary | ICD-10-CM

## 2019-08-06 NOTE — Telephone Encounter (Signed)
Pre cert 7 Day Event Monitor dx: palpitations

## 2019-08-06 NOTE — Progress Notes (Signed)
Clinical Summary Taylor Leach is a 64 y.o.female seen as a new consult, referred by NP Rakes for palpitations.   1. Palpitations - some recent feelings of heart racing, some SOB - can occur at rest or with activity - occurs daily. Lasts about 1 minute. No specific trigger  - has cut back on caffeine. 1 cup of coffee, no sodas, decaf tea, no energy drinks, no EtOH   2. Orthostatic dizziness - occasional orthostatic dizziness, though fairly infrequent - reports adequate hydration - orthostatics neg in clinic today   3. LE edema - mild recent LE edema. No significant SOB or DOE   4. Autoimmune hepatitis - followed by rheum   Past Medical History:  Diagnosis Date  . Arthritis   . Autoimmune hepatitis ([place])   . Chest pain    Stress echo normal, October, 2012  . Dyslipidemia   . Ejection fraction    EF 55-60%, echo, October, 2012  . GERD (gastroesophageal reflux disease)      Allergies  Allergen Reactions  . Fenofibrate Micronized Other (See Comments)    Caused increased LFT's Caused increased LFT's  . Codeine   . Hydrocodone Nausea And Vomiting and Other (See Comments)    Makes body hot.   . Hydrocodone-Acetaminophen Nausea And Vomiting     Current Outpatient Medications  Medication Sig Dispense Refill  . blood glucose meter kit and supplies KIT Dispense based on patient and insurance preference. Use up to four times daily as directed. (FOR ICD-9 250.00, 250.01). E11.9 1 each 0  . budesonide (ENTOCORT EC) 3 MG 24 hr capsule Take 3 mg by mouth 2 (two) times daily.    . cetirizine (ZYRTEC) 10 MG tablet Take 1 tablet (10 mg total) by mouth at bedtime. 30 tablet 11  . Clobetasol Prop Emollient Base 0.05 % emollient cream Apply 1 application topically 2 (two) times daily. 60 g 2  . CONTOUR NEXT TEST test strip CHECK BLOOD SUGAR UP TO 4 TIMES A DAY OR AS DIRECTED 400 each 2  . famotidine (PEPCID) 20 MG tablet Take 1 tablet (20 mg total) by mouth 2 (two) times daily.  60 tablet 5  . fluticasone (FLONASE) 50 MCG/ACT nasal spray Place 2 sprays into both nostrils daily. Sinus Allergies 16 g 11  . glipiZIDE (GLUCOTROL) 5 MG tablet Take 0.5 tablets (2.5 mg total) by mouth 2 (two) times daily before a meal. 60 tablet 3  . hydrochlorothiazide (HYDRODIURIL) 25 MG tablet Take 1 tablet (25 mg total) by mouth daily. 30 tablet 3  . loratadine (CLARITIN) 10 MG tablet Take 1 tablet (10 mg total) by mouth daily. 30 tablet 11  . magnesium 30 MG tablet Take 30 mg by mouth 2 (two) times daily.    . metFORMIN (GLUCOPHAGE) 500 MG tablet TAKE (1) TABLET TWICE A DAY. 60 tablet 11  . mycophenolate (MYFORTIC) 180 MG EC tablet Take by mouth.    . pantoprazole (PROTONIX) 40 MG tablet Take 1 tablet (40 mg total) by mouth daily. 30 tablet 11   No current facility-administered medications for this visit.     Past Surgical History:  Procedure Laterality Date  . CHOLECYSTECTOMY    . NASAL SINUS SURGERY    . VAGINAL HYSTERECTOMY       Allergies  Allergen Reactions  . Fenofibrate Micronized Other (See Comments)    Caused increased LFT's Caused increased LFT's  . Codeine   . Hydrocodone Nausea And Vomiting and Other (See Comments)  Makes body hot.   . Hydrocodone-Acetaminophen Nausea And Vomiting      Family History  Problem Relation Age of Onset  . Cancer Mother   . Cancer Father      Social History Ms. Kerce reports that she has quit smoking. She smoked 1.00 pack per day. She has never used smokeless tobacco. Ms. Neuzil reports current alcohol use of about 1.0 standard drinks of alcohol per week.   Review of Systems CONSTITUTIONAL: No weight loss, fever, chills, weakness or fatigue.  HEENT: Eyes: No visual loss, blurred vision, double vision or yellow sclerae.No hearing loss, sneezing, congestion, runny nose or sore throat.  SKIN: No rash or itching.  CARDIOVASCULAR: per hpi RESPIRATORY: No shortness of breath, cough or sputum.  GASTROINTESTINAL: No anorexia,  nausea, vomiting or diarrhea. No abdominal pain or blood.  GENITOURINARY: No burning on urination, no polyuria NEUROLOGICAL: No headache, dizziness, syncope, paralysis, ataxia, numbness or tingling in the extremities. No change in bowel or bladder control.  MUSCULOSKELETAL: No muscle, back pain, joint pain or stiffness.  LYMPHATICS: No enlarged nodes. No history of splenectomy.  PSYCHIATRIC: No history of depression or anxiety.  ENDOCRINOLOGIC: No reports of sweating, cold or heat intolerance. No polyuria or polydipsia.  Marland Kitchen   Physical Examination Today's Vitals   08/06/19 0853 08/06/19 0859  BP: 126/70 120/80  Pulse: 64 64  SpO2: 98% 98%  Weight: 220 lb (99.8 kg)   Height: '5\' 5"'  (1.651 m)    Body mass index is 36.61 kg/m.  Gen: resting comfortably, no acute distress HEENT: no scleral icterus, pupils equal round and reactive, no palptable cervical adenopathy,  CV: RRR, no m/r/g, no jvd Resp: Clear to auscultation bilaterally GI: abdomen is soft, non-tender, non-distended, normal bowel sounds, no hepatosplenomegaly MSK: extremities are warm, no edema.  Skin: warm, no rash Neuro:  no focal deficits Psych: appropriate affect   Diagnostic Studies 2012 echo LVEF 55-60%, grade I DDx,   2012 stress echo: no ischemia  Assessment and Plan  1. Palpitations - baseline EKG shows NSR - we will plan for a 7 day monitor to further evaluate - <5% association with her budesconide and palpitatoins.       Arnoldo Lenis, M.D.

## 2019-08-06 NOTE — Patient Instructions (Addendum)
Medication Instructions:   Your physician recommends that you continue on your current medications as directed. Please refer to the Current Medication list given to you today.  Labwork:  NONE  Testing/Procedures: Your physician has recommended that you wear an event monitor for 7 days. Event monitors are medical devices that record the heart's electrical activity. Doctors most often [place] these monitors to diagnose arrhythmias. Arrhythmias are problems with the speed or rhythm of the heartbeat. The monitor is a small, portable device. You can wear one while you do your normal daily activities. This is usually used to diagnose what is causing palpitations/syncope (passing out). [employer] will mail this monitor to your house.   Follow-Up:  Your physician recommends that you schedule a follow-up appointment in: 1 month (virtual)  Any Other Special Instructions Will Be Listed Below (If Applicable).  If you need a refill on your cardiac medications before your next appointment, please call your pharmacy.

## 2019-08-09 ENCOUNTER — Other Ambulatory Visit: Payer: Self-pay | Admitting: *Deleted

## 2019-08-09 DIAGNOSIS — E1169 Type 2 diabetes mellitus with other specified complication: Secondary | ICD-10-CM

## 2019-08-09 DIAGNOSIS — E785 Hyperlipidemia, unspecified: Secondary | ICD-10-CM

## 2019-08-09 DIAGNOSIS — E119 Type 2 diabetes mellitus without complications: Secondary | ICD-10-CM

## 2019-08-09 MED ORDER — METFORMIN HCL 500 MG PO TABS: ORAL_TABLET | ORAL | 1 refills | Status: DC

## 2019-08-11 DIAGNOSIS — R002 Palpitations: Secondary | ICD-10-CM | POA: Diagnosis not present

## 2019-08-12 ENCOUNTER — Encounter (INDEPENDENT_AMBULATORY_CARE_PROVIDER_SITE_OTHER): Payer: BC Managed Care – PPO

## 2019-08-12 DIAGNOSIS — R002 Palpitations: Secondary | ICD-10-CM | POA: Diagnosis not present

## 2019-08-20 DIAGNOSIS — J029 Acute pharyngitis, unspecified: Secondary | ICD-10-CM | POA: Diagnosis not present

## 2019-08-20 DIAGNOSIS — R0981 Nasal congestion: Secondary | ICD-10-CM | POA: Diagnosis not present

## 2019-08-20 DIAGNOSIS — Z6835 Body mass index (BMI) 35.0-35.9, adult: Secondary | ICD-10-CM | POA: Diagnosis not present

## 2019-09-09 ENCOUNTER — Encounter: Payer: Self-pay | Admitting: Cardiology

## 2019-09-09 ENCOUNTER — Telehealth (INDEPENDENT_AMBULATORY_CARE_PROVIDER_SITE_OTHER): Payer: BC Managed Care – PPO | Admitting: Cardiology

## 2019-09-09 VITALS — Ht 65.0 in | Wt 215.0 lb

## 2019-09-09 DIAGNOSIS — R002 Palpitations: Secondary | ICD-10-CM | POA: Diagnosis not present

## 2019-09-09 NOTE — Progress Notes (Signed)
Virtual Visit via Telephone Note   This visit type was conducted due to national recommendations for restrictions regarding the COVID-19 Pandemic (e.g. social distancing) in an effort to limit this patient's exposure and mitigate transmission in our community.  Due to her co-morbid illnesses, this patient is at least at moderate risk for complications without adequate follow up.  This format is felt to be most appropriate for this patient at this time.  The patient did not have access to video technology/had technical difficulties with video requiring transitioning to audio format only (telephone).  All issues noted in this document were discussed and addressed.  No physical exam could be performed with this format.  Please refer to the patient's chart for her  consent to telehealth for [facility].   The patient was identified using 2 identifiers.  Date:  09/09/2019   ID:  Taylor Leach, DOB 1955/09/07, MRN [id number]  Patient Location: Home Provider Location: Office  PCP:  [name], DO  Cardiologist:  Carlyle Dolly, MD  Electrophysiologist:  None   Evaluation Performed:  Follow-Up Visit  Chief Complaint:  Follow up  History of Present Illness:    Taylor Leach is a 64 y.o. female seen today for follow up of the following medical problems. This is a focsued visit on symptoms of palpitations  1. Palpitations - some recent feelings of heart racing, some SOB - can occur at rest or with activity - occurs daily. Lasts about 1 minute. No specific trigger  - has cut back on caffeine. 1 cup of coffee, no sodas, decaf tea, no energy drinks, no EtOH  - 08/2019 monitor showed SR with PVCs - mild stable symptoms since our last visit    The patient does not have symptoms concerning for COVID-19 infection (fever, chills, cough, or new shortness of breath).    Past Medical History:  Diagnosis Date  . Arthritis   . Autoimmune hepatitis ([place])   . Chest pain    Stress echo  normal, October, 2012  . Dyslipidemia   . Ejection fraction    EF 55-60%, echo, October, 2012  . GERD (gastroesophageal reflux disease)    Past Surgical History:  Procedure Laterality Date  . CHOLECYSTECTOMY    . NASAL SINUS SURGERY    . VAGINAL HYSTERECTOMY       Current Meds  Medication Sig  . blood glucose meter kit and supplies KIT Dispense based on patient and insurance preference. Use up to four times daily as directed. (FOR ICD-9 250.00, 250.01). E11.9  . budesonide (ENTOCORT EC) 3 MG 24 hr capsule Take 3 mg by mouth 2 (two) times daily.  . cetirizine (ZYRTEC) 10 MG tablet Take 1 tablet (10 mg total) by mouth at bedtime.  . Clobetasol Prop Emollient Base 0.05 % emollient cream Apply 1 application topically 2 (two) times daily.  . CONTOUR NEXT TEST test strip CHECK BLOOD SUGAR UP TO 4 TIMES A DAY OR AS DIRECTED  . famotidine (PEPCID) 20 MG tablet Take 1 tablet (20 mg total) by mouth 2 (two) times daily.  . fluticasone (FLONASE) 50 MCG/ACT nasal spray Place 2 sprays into both nostrils daily. Sinus Allergies  . glipiZIDE (GLUCOTROL) 5 MG tablet Take 0.5 tablets (2.5 mg total) by mouth 2 (two) times daily before a meal.  . hydrochlorothiazide (HYDRODIURIL) 25 MG tablet Take 1 tablet (25 mg total) by mouth daily.  . magnesium 30 MG tablet Take 30 mg by mouth 2 (two) times daily.  Marland Kitchen  metFORMIN (GLUCOPHAGE) 500 MG tablet TAKE (1) TABLET TWICE A DAY.  . mycophenolate (MYFORTIC) 180 MG EC tablet Take by mouth.  . pantoprazole (PROTONIX) 40 MG tablet Take 1 tablet (40 mg total) by mouth daily.     Allergies:   Fenofibrate micronized, Codeine, Hydrocodone, and Hydrocodone-acetaminophen   Social History   Tobacco Use  . Smoking status: Former Smoker    Packs/day: 1.00  . Smokeless tobacco: Never Used  Substance Use Topics  . Alcohol use: Yes    Alcohol/week: 1.0 standard drinks    Types: 1 Glasses of wine per week  . Drug use: No     Family Hx: The patient's family history  includes Cancer in her father and mother.  ROS:   Please see the history of present illness.     All other systems reviewed and are negative.   Prior CV studies:   The following studies were reviewed today:    Labs/Other Tests and Data Reviewed:    EKG:  No ECG reviewed.  Recent Labs: 07/16/2019: ALT 52; BUN 13; Creatinine, Ser 0.85; Hemoglobin 15.2; Platelets 171; Potassium 3.8; Sodium 141; TSH 1.990   Recent Lipid Panel Lab Results  Component Value Date/Time   CHOL 245 (H) 06/07/2019 08:45 AM   TRIG 201 (H) 06/07/2019 08:45 AM   HDL 58 06/07/2019 08:45 AM   CHOLHDL 4.2 06/07/2019 08:45 AM   CHOLHDL 4.1 10/23/2010 05:48 PM   LDLCALC 151 (H) 06/07/2019 08:45 AM    Wt Readings from Last 3 Encounters:  09/09/19 215 lb (97.5 kg)  08/06/19 220 lb (99.8 kg)  07/16/19 218 lb (98.9 kg)     Objective:    Vital Signs:  Ht _0  (1.651 m)   Wt 215 lb (97.5 kg)   BMI 35.78 kg/m    Normal affect. Normal speech pattern and tone. Comfortable, no apparent distress. No audible signs of sob or wheezing.   ASSESSMENT & PLAN:    1. Palpitations - baseline EKG shows NSR - event monitor with occasional PVCs, mild burden without any NSVT - no indication for additioanl cardiac testing - we did discuss trial of beta blocker of CCB, she reports symptoms are tolerable and in general wants to hold off on any additional meds    COVID-19 Education: The signs and symptoms of COVID-19 were discussed with the patient and how to seek care for testing (follow up with PCP or arrange E-visit).  The importance of social distancing was discussed today.  Time:   Today, I have spent 13 minutes with the patient with telehealth technology discussing the above problems.     Medication Adjustments/Labs and Tests Ordered: Current medicines are reviewed at length with the patient today.  Concerns regarding medicines are outlined above.   Tests Ordered: No orders of the defined types were placed  in this encounter.   Medication Changes: No orders of the defined types were placed in this encounter.   Follow Up:  In Person in 1 year(s)  Signed, Carlyle Dolly, MD  09/09/2019 8:07 AM    [place]

## 2019-09-09 NOTE — Patient Instructions (Signed)

## 2019-09-10 ENCOUNTER — Ambulatory Visit: Payer: BC Managed Care – PPO | Admitting: Physician Assistant

## 2019-09-16 ENCOUNTER — Other Ambulatory Visit: Payer: Self-pay

## 2019-09-16 ENCOUNTER — Ambulatory Visit (INDEPENDENT_AMBULATORY_CARE_PROVIDER_SITE_OTHER): Payer: BC Managed Care – PPO | Admitting: Family Medicine

## 2019-09-16 ENCOUNTER — Encounter: Payer: Self-pay | Admitting: Family Medicine

## 2019-09-16 ENCOUNTER — Ambulatory Visit: Payer: BC Managed Care – PPO | Admitting: Family Medicine

## 2019-09-16 VITALS — BP 132/71 | HR 75 | Temp 97.7°F | Ht 65.0 in | Wt 220.0 lb

## 2019-09-16 DIAGNOSIS — I10 Essential (primary) hypertension: Secondary | ICD-10-CM

## 2019-09-16 DIAGNOSIS — E1169 Type 2 diabetes mellitus with other specified complication: Secondary | ICD-10-CM | POA: Diagnosis not present

## 2019-09-16 DIAGNOSIS — E041 Nontoxic single thyroid nodule: Secondary | ICD-10-CM

## 2019-09-16 DIAGNOSIS — K760 Fatty (change of) liver, not elsewhere classified: Secondary | ICD-10-CM

## 2019-09-16 DIAGNOSIS — E1159 Type 2 diabetes mellitus with other circulatory complications: Secondary | ICD-10-CM

## 2019-09-16 DIAGNOSIS — M799 Soft tissue disorder, unspecified: Secondary | ICD-10-CM

## 2019-09-16 DIAGNOSIS — Z7689 Persons encountering health services in other specified circumstances: Secondary | ICD-10-CM | POA: Diagnosis not present

## 2019-09-16 DIAGNOSIS — I152 Hypertension secondary to endocrine disorders: Secondary | ICD-10-CM

## 2019-09-16 DIAGNOSIS — E785 Hyperlipidemia, unspecified: Secondary | ICD-10-CM

## 2019-09-16 LAB — BAYER DCA HB A1C WAIVED: HB A1C (BAYER DCA - WAIVED): 8.1 % — ABNORMAL HIGH (ref <7.0)

## 2019-09-16 MED ORDER — METFORMIN HCL 500 MG PO TABS: ORAL_TABLET | ORAL | 0 refills | Status: DC

## 2019-09-16 NOTE — Progress Notes (Addendum)
Subjective: CC: est care, DM2, HTN, FLD PCP: [name], DO ZJI:RCVE Taylor Leach is a 64 y.o. female presenting to clinic today for:  1. Type 2 Diabetes w/ HTN, HLD, Autoimmune hepatitis/NASH:  Patient reports taking medication(s): metformin 542m qd, Glipizide 2.512mBID, HCTZ.  She is had problems with hypoglycemia in the past with the glipizide.  She and her previous PCP opted to try this again and she notes that she is again having hypoglycemic episodes.  She has never been on higher doses of the Metformin but would be willing to try it.  She has had some diarrhea with Metformin twice daily but nothing significant.  Of note she is treated with budesonide for an autoimmune disease.  Last eye exam: needs Last foot exam: needs Last A1c:  Lab Results  Component Value Date   HGBA1C 8.2 (H) 06/07/2019   Nephropathy screen indicated?: 02/2020 due Last flu, zoster and/or pneumovax:  Immunization History  Administered Date(s) Administered  . Hep A / Hep B 03/24/2008, 04/23/2008, 09/08/2008  . Influenza, Seasonal, Injecte, Preservative Fre 01/10/2008  . Influenza,inj,Quad PF,6+ Mos 05/18/2016  . Influenza-Unspecified 02/02/2018  . Pneumococcal Polysaccharide-23 11/30/2017  . 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  Denies any history of foot ulcerations.  No chest pain, shortness of breath.  She does report some floaters and has an appointment soon with her eye doctor.  2.  Autoimmune hepatitis Patient is [facility] She needs every 3 months IgG, CBC, CMP testing and would like to have these CCed to her specialist.  Apparently, her levels have been rising and they are trying to watch this very closely.  She reports compliance with her Myfortic.   ROS: Per HPI  Allergies  Allergen Reactions  . Fenofibrate Micronized Other (See Comments)    Caused increased LFT's Caused increased LFT's  .  Codeine   . Hydrocodone Nausea And Vomiting and Other (See Comments)    Makes body hot.   . Hydrocodone-Acetaminophen Nausea And Vomiting   Past Medical History:  Diagnosis Date  . Arthritis   . [place]  . Chest pain    Stress echo normal, October, 2012  . Dyslipidemia   . Ejection fraction    EF 55-60%, echo, October, 2012  . GERD (gastroesophageal reflux disease)     Current Outpatient Medications:  .  blood glucose meter kit and supplies KIT, Dispense based on patient and insurance preference. Use up to four times daily as directed. (FOR ICD-9 250.00, 250.01). E11.9, Disp: 1 each, Rfl: 0 .  budesonide (ENTOCORT EC) 3 MG 24 hr capsule, Take 3 mg by mouth 2 (two) times daily., Disp: , Rfl:  .  cetirizine (ZYRTEC) 10 MG tablet, Take 1 tablet (10 mg total) by mouth at bedtime., Disp: 30 tablet, Rfl: 11 .  Clobetasol Prop Emollient Base 0.05 % emollient cream, Apply 1 application topically 2 (two) times daily., Disp: 60 g, Rfl: 2 .  CONTOUR NEXT TEST test strip, CHECK BLOOD SUGAR UP TO 4 TIMES A DAY OR AS DIRECTED, Disp: 400 each, Rfl: 2 .  famotidine (PEPCID) 20 MG tablet, Take 1 tablet (20 mg total) by mouth 2 (two) times daily., Disp: 60 tablet, Rfl: 5 .  fluticasone (FLONASE) 50 MCG/ACT nasal spray, Place 2 sprays into both nostrils daily. Sinus Allergies, Disp: 16 g, Rfl: 11 .  glipiZIDE (GLUCOTROL) 5 MG tablet, Take 0.5  tablets (2.5 mg total) by mouth 2 (two) times daily before a meal., Disp: 60 tablet, Rfl: 3 .  hydrochlorothiazide (HYDRODIURIL) 25 MG tablet, Take 1 tablet (25 mg total) by mouth daily., Disp: 30 tablet, Rfl: 3 .  magnesium 30 MG tablet, Take 30 mg by mouth 2 (two) times daily., Disp: , Rfl:  .  metFORMIN (GLUCOPHAGE) 500 MG tablet, TAKE (1) TABLET TWICE A DAY., Disp: 60 tablet, Rfl: 1 .  mycophenolate (MYFORTIC) 180 MG EC tablet, Take by mouth., Disp: , Rfl:  .  pantoprazole (PROTONIX) 40 MG tablet, Take 1 tablet (40 mg total) by mouth daily.,  Disp: 30 tablet, Rfl: 11 Social History   Socioeconomic History  . Marital status: Married    Spouse name: Not on file  . Number of children: Not on file  . Years of education: Not on file  . Highest education level: Not on file  Occupational History  . Not on file  Tobacco Use  . Smoking status: Former Smoker    Packs/day: 1.00  . Smokeless tobacco: Never Used  Vaping Use  . Vaping Use: Never used  Substance and Sexual Activity  . Alcohol use: Yes    Alcohol/week: 1.0 standard drink    Types: 1 Glasses of wine per week  . Drug use: No  . Sexual activity: Yes    Birth control/protection: Surgical  Other Topics Concern  . Not on file  Social History Narrative  . Not on file   Social Determinants of Health   Financial Resource Strain:   . Difficulty of Paying Living Expenses:   Food Insecurity:   . Worried About Charity fundraiser in the Last Year:   . Arboriculturist in the Last Year:   Transportation Needs:   . Film/video editor (Medical):   Marland Kitchen Lack of Transportation (Non-Medical):   Physical Activity:   . Days of Exercise per Week:   . Minutes of Exercise per Session:   Stress:   . Feeling of Stress :   Social Connections:   . Frequency of Communication with Friends and Family:   . Frequency of Social Gatherings with Friends and Family:   . Attends Religious Services:   . Active Member of Clubs or Organizations:   . Attends Archivist Meetings:   Marland Kitchen Marital Status:   Intimate Partner Violence:   . Fear of Current or Ex-Partner:   . Emotionally Abused:   Marland Kitchen Physically Abused:   . Sexually Abused:    Family History  Problem Relation Age of Onset  . Cancer Mother   . Cancer Father     Objective: Office vital signs reviewed. BP 132/71   Pulse 75   Temp 97.7 F (36.5 C) (Temporal)   Ht _0  (1.651 m)   Wt 220 lb (99.8 kg)   SpO2 97%   BMI 36.61 kg/m   Physical Examination:  General: Awake, alert, well nourished, No acute  distress HEENT: Normal, sclera white, MMM Cardio: regular rate and rhythm, S1S2 heard, no murmurs appreciated Pulm: clear to auscultation bilaterally, no wheezes, rhonchi or rales; normal work of breathing on room air Extremities: warm, well perfused, No edema, cyanosis or clubbing; +2 pulses bilaterally Neuro: Alert and oriented.  No focal neurologic deficits.  See DM foot exam Diabetic Foot Exam - Simple   Simple Foot Form Diabetic Foot exam was performed with the following findings: Yes 09/16/2019  4:44 PM  Visual Inspection No deformities, no ulcerations, no  other skin breakdown bilaterally: Yes Sensation Testing Intact to touch and monofilament testing bilaterally: Yes Pulse Check Posterior Tibialis and Dorsalis pulse intact bilaterally: Yes Comments Mild reduction in vibratory sensation bilaterally     Assessment/ Plan: 64 y.o. female   1. Type 2 diabetes mellitus with other specified complication, without long-term current use of insulin (HCC) A1c is still not at goal.  We discussed discontinuation of glipizide given hypoglycemic episodes.  Increase Metformin to 500 mg twice daily for 4 weeks then can increase to 1000 mg twice daily.  Plan to follow-up in 3 months.  We discussed potential need for something like Januvia if we are unable to get her sugar under control.  She seemed amenable to this. - Bayer DCA Hb A1c Waived - Bayer DCA Hb A1c Waived; Standing - metFORMIN (GLUCOPHAGE) 500 MG tablet; Take 1 tablet (500 mg total) by mouth 2 (two) times daily with a meal for 28 days, THEN 2 tablets (1,000 mg total) 2 (two) times daily with a meal. TAKE (1) TABLET TWICE A DAY.Marland Kitchen  Dispense: 360 tablet; Refill: 0  2. 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 Hyperlipidemia associated with type 2 diabetes mellitus (HCC) Previously  on Lipitor 10 mg and she did not feel that there was a rise in her liver function test but is unsure.  She certainly has not been able to tolerate fenofibrate in the past.  I will await recommendations by her specialist before initiating any antilipid medications. - metFORMIN (GLUCOPHAGE) 500 MG tablet; Take 1 tablet (500 mg total) by mouth 2 (two) times daily with a meal for 28 days, THEN 2 tablets (1,000 mg total) 2 (two) times daily with a meal. TAKE (1) TABLET TWICE A DAY.Marland Kitchen  Dispense: 360 tablet; Refill: 0  4. Hypertension associated with diabetes ([place]) Controlled  5. Establishing care with new doctor, encounter for  6. Thyroid nodule Noted on previous imaging.  Her ultrasound from 2015 at [place] [facility].  Described as a diffusely hypoechoic thyroid lobe and slightly lobulated like gland.  However, the previous nodule had been described on prior examination as deep to the inferior pole of the left lobe was felt to represent normal thyroid tissue separated from the main lobe by echogenic cleft.  Further thyroid ultrasound were to be performed under direct supervision of radiology. - US THYROID; Future  7. Lesion of soft tissue Right paratracheal soft tissue lesion?  Lymph node noted on previous exam, last examined in 2018 and described as an enlarged lymph node in the right pretracheal location - CT Chest Wo Contrast; Future   No orders of the defined types were placed in this encounter.  No orders of the defined types were placed in this encounter.    [name], DO [place] [phone]

## 2019-09-16 NOTE — Patient Instructions (Signed)
I have ordered standing labs for you.  I will order ultrasound of thyroid/ chest imaging  Have your eye doctor send me records  I have increased your Metformin.  Take 1 tablet twice daily for 3-4 weeks, then increase to 2 tablets twice daily.  If you have intolerance (too much diarrhea, nausea, etc), please let me know ASAP so we can make adjustments to meds prior to your next appt.

## 2019-09-16 NOTE — Addendum Note (Signed)
Addended by: Raliegh Ip on: 09/16/2019 04:57 PM   Modules accepted: Orders

## 2019-09-17 ENCOUNTER — Ambulatory Visit: Payer: BC Managed Care – PPO | Admitting: Family Medicine

## 2019-09-17 LAB — CMP14+EGFR
ALT: 63 IU/L — ABNORMAL HIGH (ref 0–32)
AST: 39 IU/L (ref 0–40)
Albumin/Globulin Ratio: 1.2 (ref 1.2–2.2)
Albumin: 4.2 g/dL (ref 3.8–4.8)
Alkaline Phosphatase: 65 IU/L (ref 48–121)
BUN/Creatinine Ratio: 17 (ref 12–28)
BUN: 15 mg/dL (ref 8–27)
Bilirubin Total: 0.3 mg/dL (ref 0.0–1.2)
CO2: 22 mmol/L (ref 20–29)
Calcium: 9.5 mg/dL (ref 8.7–10.3)
Chloride: 98 mmol/L (ref 96–106)
Creatinine, Ser: 0.88 mg/dL (ref 0.57–1.00)
GFR calc Af Amer: 81 mL/min/{1.73_m2} (ref >59)
GFR calc non Af Amer: 70 mL/min/{1.73_m2} (ref >59)
Globulin, Total: 3.5 g/dL (ref 1.5–4.5)
Glucose: 136 mg/dL — ABNORMAL HIGH (ref 65–99)
Potassium: 4.2 mmol/L (ref 3.5–5.2)
Sodium: 134 mmol/L (ref 134–144)
Total Protein: 7.7 g/dL (ref 6.0–8.5)

## 2019-09-17 LAB — CBC WITH DIFFERENTIAL/PLATELET
Basophils Absolute: 0 10*3/uL (ref 0.0–0.2)
Basos: 0 %
EOS (ABSOLUTE): 0.1 10*3/uL (ref 0.0–0.4)
Eos: 1 %
Hematocrit: 41.5 % (ref 34.0–46.6)
Hemoglobin: 14.3 g/dL (ref 11.1–15.9)
Immature Grans (Abs): 0 10*3/uL (ref 0.0–0.1)
Immature Granulocytes: 1 %
Lymphocytes Absolute: 1.3 10*3/uL (ref 0.7–3.1)
Lymphs: 15 %
MCH: 32.4 pg (ref 26.6–33.0)
MCHC: 34.5 g/dL (ref 31.5–35.7)
MCV: 94 fL (ref 79–97)
Monocytes Absolute: 0.9 10*3/uL (ref 0.1–0.9)
Monocytes: 11 %
Neutrophils Absolute: 5.9 10*3/uL (ref 1.4–7.0)
Neutrophils: 72 %
Platelets: 194 10*3/uL (ref 150–450)
RBC: 4.42 x10E6/uL (ref 3.77–5.28)
RDW: 12.8 % (ref 11.7–15.4)
WBC: 8.2 10*3/uL (ref 3.4–10.8)

## 2019-09-17 LAB — IGG: IgG (Immunoglobin G), Serum: 1729 mg/dL — ABNORMAL HIGH (ref 586–1602)

## 2019-09-18 ENCOUNTER — Telehealth: Payer: Self-pay | Admitting: Family Medicine

## 2019-10-02 DIAGNOSIS — K754 Autoimmune hepatitis: Secondary | ICD-10-CM | POA: Diagnosis not present

## 2019-10-09 ENCOUNTER — Other Ambulatory Visit: Payer: Self-pay

## 2019-10-09 ENCOUNTER — Ambulatory Visit (HOSPITAL_COMMUNITY)
Admission: RE | Admit: 2019-10-09 | Discharge: 2019-10-09 | Disposition: A | Discharge status: Home / Self Care | Payer: BC Managed Care – PPO | Source: Ambulatory Visit | Attending: Family Medicine | Admitting: Family Medicine

## 2019-10-09 DIAGNOSIS — I700 Atherosclerosis of aorta: Secondary | ICD-10-CM | POA: Diagnosis not present

## 2019-10-09 DIAGNOSIS — M799 Soft tissue disorder, unspecified: Secondary | ICD-10-CM | POA: Insufficient documentation

## 2019-10-09 DIAGNOSIS — E041 Nontoxic single thyroid nodule: Secondary | ICD-10-CM | POA: Insufficient documentation

## 2019-10-16 DIAGNOSIS — L821 Other seborrheic keratosis: Secondary | ICD-10-CM | POA: Diagnosis not present

## 2019-10-16 DIAGNOSIS — L570 Actinic keratosis: Secondary | ICD-10-CM | POA: Diagnosis not present

## 2019-10-16 DIAGNOSIS — D1801 Hemangioma of skin and subcutaneous tissue: Secondary | ICD-10-CM | POA: Diagnosis not present

## 2019-10-21 ENCOUNTER — Other Ambulatory Visit: Payer: Self-pay | Admitting: *Deleted

## 2019-10-21 DIAGNOSIS — K219 Gastro-esophageal reflux disease without esophagitis: Secondary | ICD-10-CM

## 2019-10-21 MED ORDER — PANTOPRAZOLE SODIUM 40 MG PO TBEC: 40.0000 mg | DELAYED_RELEASE_TABLET | Freq: Every day | ORAL | 1 refills | Status: DC

## 2019-11-08 DIAGNOSIS — Z23 Encounter for immunization: Secondary | ICD-10-CM | POA: Diagnosis not present

## 2019-11-29 DIAGNOSIS — Z23 Encounter for immunization: Secondary | ICD-10-CM | POA: Diagnosis not present

## 2019-12-23 ENCOUNTER — Encounter: Payer: Self-pay | Admitting: Family Medicine

## 2019-12-23 ENCOUNTER — Other Ambulatory Visit: Payer: Self-pay

## 2019-12-23 ENCOUNTER — Ambulatory Visit (INDEPENDENT_AMBULATORY_CARE_PROVIDER_SITE_OTHER): Payer: BC Managed Care – PPO | Admitting: Family Medicine

## 2019-12-23 VITALS — BP 131/64 | HR 74 | Temp 97.6°F | Ht 65.0 in | Wt 223.0 lb

## 2019-12-23 DIAGNOSIS — I152 Hypertension secondary to endocrine disorders: Secondary | ICD-10-CM

## 2019-12-23 DIAGNOSIS — L304 Erythema intertrigo: Secondary | ICD-10-CM | POA: Diagnosis not present

## 2019-12-23 DIAGNOSIS — E1159 Type 2 diabetes mellitus with other circulatory complications: Secondary | ICD-10-CM

## 2019-12-23 DIAGNOSIS — E1169 Type 2 diabetes mellitus with other specified complication: Secondary | ICD-10-CM | POA: Diagnosis not present

## 2019-12-23 DIAGNOSIS — K754 Autoimmune hepatitis: Secondary | ICD-10-CM | POA: Diagnosis not present

## 2019-12-23 DIAGNOSIS — E785 Hyperlipidemia, unspecified: Secondary | ICD-10-CM

## 2019-12-23 DIAGNOSIS — K760 Fatty (change of) liver, not elsewhere classified: Secondary | ICD-10-CM | POA: Diagnosis not present

## 2019-12-23 DIAGNOSIS — I10 Essential (primary) hypertension: Secondary | ICD-10-CM

## 2019-12-23 LAB — BAYER DCA HB A1C WAIVED: HB A1C (BAYER DCA - WAIVED): 7.7 % — ABNORMAL HIGH (ref <7.0)

## 2019-12-23 MED ORDER — JANUMET XR 50-1000 MG PO TB24: 1.0000 | ORAL_TABLET | Freq: Every day | ORAL | 1 refills | Status: DC

## 2019-12-23 MED ORDER — NYSTATIN 100000 UNIT/GM EX CREA: 1.0000 "application " | TOPICAL_CREAM | Freq: Two times a day (BID) | CUTANEOUS | 1 refills | Status: DC

## 2019-12-23 NOTE — Progress Notes (Signed)
Subjective: CC: f/u DM2, HTN, FLD PCP: [name], DO Taylor Leach is a 64 y.o. female presenting to clinic today for:  1. Type 2 Diabetes w/ HTN, HLD, Autoimmune hepatitis/NASH:  Patient reports taking medication(s): metformin 1097m BID (increased last visit), HCTZ. .Marland Kitchen Glipizide was dc'd.  She has been tolerating the Metformin dose fairly well.  She has had some loose stools but this seems to be more when she is having carb heavy meals.  Does not report any hypoglycemic episodes since discontinuing the glipizide.  Neuropathy is stable.  Last eye exam: needs Last foot exam: UTD Last A1c:  Lab Results  Component Value Date   HGBA1C 8.1 (H) 09/16/2019   Nephropathy screen indicated?: 02/2020 due Last flu, zoster and/or pneumovax:  Immunization History  Administered Date(s) Administered   Hep A / Hep B 03/24/2008, 04/23/2008, 09/08/2008   Influenza, Seasonal, Injecte, Preservative Fre 01/10/2008   Influenza,inj,Quad PF,6+ Mos 05/18/2016   Influenza-Unspecified 02/02/2018   Pneumococcal Polysaccharide-23 11/30/2017   Zoster Recombinat (Shingrix) 12/12/2018    2.  Autoimmune hepatitis [facility]  She needs every 3 months IgG, CBC, CMP testing and would like to have these CCed to her specialist.    She reports some fatigue and associates this with use of Myfortic.  Hopefully this will be changed in.  She has an upcoming appointment.  She does need her labs obtained as above.   ROS: Per HPI  Allergies  Allergen Reactions   Fenofibrate Micronized Other (See Comments)    Caused increased LFT's Caused increased LFT's   Codeine    Hydrocodone Nausea And Vomiting and Other (See Comments)    Makes body hot.    Hydrocodone-Acetaminophen Nausea And Vomiting   Past Medical History:  Diagnosis Date   Arthritis    Autoimmune hepatitis ([place]    Chest pain    Stress echo normal, October, 2012   Dyslipidemia     Ejection fraction    EF 55-60%, echo, October, 2012   GERD (gastroesophageal reflux disease)     Current Outpatient Medications:    blood glucose meter kit and supplies KIT, Dispense based on patient and insurance preference. Use up to four times daily as directed. (FOR ICD-9 250.00, 250.01). E11.9, Disp: 1 each, Rfl: 0   budesonide (ENTOCORT EC) 3 MG 24 hr capsule, Take 3 mg by mouth 2 (two) times daily., Disp: , Rfl:    Clobetasol Prop Emollient Base 0.05 % emollient cream, Apply 1 application topically 2 (two) times daily., Disp: 60 g, Rfl: 2   CONTOUR NEXT TEST test strip, CHECK BLOOD SUGAR UP TO 4 TIMES A DAY OR AS DIRECTED, Disp: 400 each, Rfl: 2   fluticasone (FLONASE) 50 MCG/ACT nasal spray, Place 2 sprays into both nostrils daily. Sinus Allergies, Disp: 16 g, Rfl: 11   hydrochlorothiazide (HYDRODIURIL) 25 MG tablet, Take 1 tablet (25 mg total) by mouth daily., Disp: 30 tablet, Rfl: 3   metFORMIN (GLUCOPHAGE) 500 MG tablet, Take 1 tablet (500 mg total) by mouth 2 (two) times daily with a meal for 28 days, THEN 2 tablets (1,000 mg total) 2 (two) times daily with a meal. TAKE (1) TABLET TWICE A DAY.., Disp: 360 tablet, Rfl: 0   pantoprazole (PROTONIX) 40 MG tablet, Take 1 tablet (40 mg total) by mouth daily., Disp: 30 tablet, Rfl: 1 Social History   Socioeconomic History   Marital status: Married    Spouse name: Not  on file   Number of children: Not on file   Years of education: Not on file   Highest education level: Not on file  Occupational History   Not on file  Tobacco Use   Smoking status: Former Smoker    Packs/day: 1.00   Smokeless tobacco: Never Used  Vaping Use   Vaping Use: Never used  Substance and Sexual Activity   Alcohol use: Yes    Alcohol/week: 1.0 standard drink    Types: 1 Glasses of wine per week   Drug use: No   Sexual activity: Yes    Birth control/protection: Surgical  Other Topics Concern   Not on file  Social History  Narrative   Not on file   Social Determinants of Health   Financial Resource Strain:    Difficulty of Paying Living Expenses: Not on file  Food Insecurity:    Worried About Charity fundraiser in the Last Year: Not on file   [employer] of Food in the Last Year: Not on file  Transportation Needs:    Lack of Transportation (Medical): Not on file   Lack of Transportation (Non-Medical): Not on file  Physical Activity:    Days of Exercise per Week: Not on file   Minutes of Exercise per Session: Not on file  Stress:    Feeling of Stress : Not on file  Social Connections:    Frequency of Communication with Friends and Family: Not on file   Frequency of Social Gatherings with Friends and Family: Not on file   Attends Religious Services: Not on file   Active Member of Clubs or Organizations: Not on file   Attends Archivist Meetings: Not on file   Marital Status: Not on file  Intimate Partner Violence:    Fear of Current or Ex-Partner: Not on file   Emotionally Abused: Not on file   Physically Abused: Not on file   Sexually Abused: Not on file   Family History  Problem Relation Age of Onset   Cancer Mother    Cancer Father     Objective: Office vital signs reviewed. BP 131/64    Pulse 74    Temp 97.6 F (36.4 C) (Temporal)    Ht '5\' 5"'  (1.651 m)    Wt 223 lb (101.2 kg)    SpO2 97%    BMI 37.11 kg/m   Physical Examination:  General: Awake, alert, well nourished, No acute distress HEENT: Normal, sclera white, MMM Cardio: regular rate and rhythm, S1S2 heard, no murmurs appreciated Pulm: clear to auscultation bilaterally, no wheezes, rhonchi or rales; normal work of breathing on room air Extremities: warm, well perfused, No edema, cyanosis or clubbing; +2 pulses bilaterally  Assessment/ Plan: 64 y.o. female   Type 2 diabetes mellitus with other specified complication, without long-term current use of insulin (HCC) - Plan: Bayer DCA Hb A1c Waived,  SitaGLIPtin-MetFORMIN HCl (JANUMET XR) 50-1000 MG TB24  Hyperlipidemia associated with type 2 diabetes mellitus (HCC)  Autoimmune hepatitis ([place]) - Plan: CMP14+EGFR, IGG, CBC  Hypertension associated with diabetes ([place])  Intertrigo - Plan: nystatin cream (MYCOSTATIN)  Sugar has come down some but is not quite at goal, was 7.7 today.  She is pretty close to were going to add a little bit of Januvia in the form of Janumet today.  Advised her to discontinue Metformin since this is in the combination pill.  Instructions for use discussed.  I will follow up with her again in 3 months,  sooner if needed.  A 1 month sample was provided to the patient today.  No other medications changed today.  I will CC her lab results to her specialist at [facility] once they are available.  No orders of the defined types were placed in this encounter.  No orders of the defined types were placed in this encounter.    [name], DO [place] [phone]

## 2019-12-23 NOTE — Patient Instructions (Signed)
STOP plain metformin. I have prescribed you a combo pill that has it in there already.  See me back in 3 months for recheck of sugar.   Intertrigo Intertrigo is skin irritation (inflammation) that happens in warm, moist areas of the body. The irritation can cause a rash and make skin raw and itchy. The rash is usually pink or red. It happens mostly between folds of skin or where skin rubs together, such as:  Between the toes.  In the armpits.  In the groin area.  Under the belly.  Under the breasts.  Around the butt area. This condition is not passed from person to person (is not contagious). What are the causes?  Heat, moisture, rubbing, and not enough air movement.  The condition can be made worse by: ? Sweat. ? Bacteria. ? A fungus, such as yeast. What increases the risk?  Moisture in your skin folds.  You are more likely to develop this condition if you: ? Have diabetes. ? Are overweight. ? Are not able to move around. ? Live in a warm and moist climate. ? Wear splints, braces, or other medical devices. ? Are not able to control your pee (urine) or poop (stool). What are the signs or symptoms?  A pink or red skin rash in the skin fold or near the skin fold.  Raw or scaly skin.  Itching.  A burning feeling.  Bleeding.  Leaking fluid.  A bad smell. How is this treated?  Cleaning and drying your skin.  Taking an antibiotic medicine or using an antibiotic skin cream for a bacterial infection.  Using an antifungal cream on your skin or taking pills for an infection that was caused by a fungus, such as yeast.  Using a steroid ointment to stop the itching and irritation.  Separating the skin fold with a clean cotton cloth to absorb moisture and allow air to flow into the area. Follow these instructions at home:  Keep the affected area clean and dry.  Do not scratch your skin.  Stay cool as much as you can. Use an air conditioner or a fan, if you  have one.  Apply over-the-counter and prescription medicines only as told by your doctor.  If you were prescribed an antibiotic medicine, use it as told by your doctor. Do not stop using the antibiotic even if your condition starts to get better.  Keep all follow-up visits as told by your doctor. This is important. How is this prevented?   Stay at a healthy weight.  Take care of your feet. This is very important if you have diabetes. You should: ? Wear shoes that fit well. ? Keep your feet dry. ? Wear clean cotton or wool socks.  Protect the skin in your groin and butt area as told by your doctor. To do this: ? Follow a regular cleaning routine. ? Use creams, powders, or ointments that protect your skin. ? Change protection pads often.  Do not wear tight clothes. Wear clothes that: ? Are loose. ? Take moisture away from your body. ? Are made of cotton.  Wear a bra that gives good support, if needed.  Shower and dry yourself well after being active. Use a hair dryer on a cool setting to dry between skin folds.  Keep your blood sugar under control if you have diabetes. Contact a doctor if:  Your symptoms do not get better with treatment.  Your symptoms get worse or they spread.  You notice more redness  and warmth.  You have a fever. Summary  Intertrigo is skin irritation that occurs when folds of skin rub together.  This condition is caused by heat, moisture, and rubbing.  This condition may be treated by cleaning and drying your skin and with medicines.  Apply over-the-counter and prescription medicines only as told by your doctor.  Keep all follow-up visits as told by your doctor. This is important. This information is not intended to replace advice given to you by your health care provider. Make sure you discuss any questions you have with your health care provider. Document Revised: 12/28/2017 Document Reviewed: 12/28/2017 Elsevier Patient Education  2020  [employer].

## 2019-12-24 DIAGNOSIS — K754 Autoimmune hepatitis: Secondary | ICD-10-CM | POA: Diagnosis not present

## 2019-12-24 LAB — IGG: IgG (Immunoglobin G), Serum: 1456 mg/dL (ref 586–1602)

## 2019-12-24 LAB — CBC
Hematocrit: 40.8 % (ref 34.0–46.6)
Hemoglobin: 13.8 g/dL (ref 11.1–15.9)
MCH: 31.2 pg (ref 26.6–33.0)
MCHC: 33.8 g/dL (ref 31.5–35.7)
MCV: 92 fL (ref 79–97)
Platelets: 160 10*3/uL (ref 150–450)
RBC: 4.42 x10E6/uL (ref 3.77–5.28)
RDW: 12.6 % (ref 11.7–15.4)
WBC: 6 10*3/uL (ref 3.4–10.8)

## 2019-12-24 LAB — CMP14+EGFR
ALT: 71 IU/L — ABNORMAL HIGH (ref 0–32)
AST: 39 IU/L (ref 0–40)
Albumin/Globulin Ratio: 1.2 (ref 1.2–2.2)
Albumin: 4.1 g/dL (ref 3.8–4.8)
Alkaline Phosphatase: 60 IU/L (ref 44–121)
BUN/Creatinine Ratio: 18 (ref 12–28)
BUN: 14 mg/dL (ref 8–27)
Bilirubin Total: 0.2 mg/dL (ref 0.0–1.2)
CO2: 19 mmol/L — ABNORMAL LOW (ref 20–29)
Calcium: 9.5 mg/dL (ref 8.7–10.3)
Chloride: 102 mmol/L (ref 96–106)
Creatinine, Ser: 0.8 mg/dL (ref 0.57–1.00)
GFR calc Af Amer: 91 mL/min/{1.73_m2} (ref >59)
GFR calc non Af Amer: 79 mL/min/{1.73_m2} (ref >59)
Globulin, Total: 3.4 g/dL (ref 1.5–4.5)
Glucose: 204 mg/dL — ABNORMAL HIGH (ref 65–99)
Potassium: 4.3 mmol/L (ref 3.5–5.2)
Sodium: 137 mmol/L (ref 134–144)
Total Protein: 7.5 g/dL (ref 6.0–8.5)

## 2019-12-28 ENCOUNTER — Other Ambulatory Visit: Payer: Self-pay | Admitting: Family Medicine

## 2019-12-28 DIAGNOSIS — K219 Gastro-esophageal reflux disease without esophagitis: Secondary | ICD-10-CM

## 2020-01-09 DIAGNOSIS — H5213 Myopia, bilateral: Secondary | ICD-10-CM | POA: Diagnosis not present

## 2020-01-09 DIAGNOSIS — H43811 Vitreous degeneration, right eye: Secondary | ICD-10-CM | POA: Diagnosis not present

## 2020-01-09 DIAGNOSIS — E119 Type 2 diabetes mellitus without complications: Secondary | ICD-10-CM | POA: Diagnosis not present

## 2020-01-09 DIAGNOSIS — H52203 Unspecified astigmatism, bilateral: Secondary | ICD-10-CM | POA: Diagnosis not present

## 2020-01-09 LAB — HM DIABETES EYE EXAM

## 2020-01-13 DIAGNOSIS — Z6836 Body mass index (BMI) 36.0-36.9, adult: Secondary | ICD-10-CM | POA: Diagnosis not present

## 2020-01-13 DIAGNOSIS — K754 Autoimmune hepatitis: Secondary | ICD-10-CM | POA: Diagnosis not present

## 2020-01-24 DIAGNOSIS — L570 Actinic keratosis: Secondary | ICD-10-CM | POA: Diagnosis not present

## 2020-01-24 DIAGNOSIS — L820 Inflamed seborrheic keratosis: Secondary | ICD-10-CM | POA: Diagnosis not present

## 2020-01-24 DIAGNOSIS — L918 Other hypertrophic disorders of the skin: Secondary | ICD-10-CM | POA: Diagnosis not present

## 2020-01-24 DIAGNOSIS — B078 Other viral warts: Secondary | ICD-10-CM | POA: Diagnosis not present

## 2020-01-24 DIAGNOSIS — L821 Other seborrheic keratosis: Secondary | ICD-10-CM | POA: Diagnosis not present

## 2020-03-20 ENCOUNTER — Other Ambulatory Visit: Payer: Self-pay

## 2020-03-20 ENCOUNTER — Ambulatory Visit (INDEPENDENT_AMBULATORY_CARE_PROVIDER_SITE_OTHER): Payer: BC Managed Care – PPO | Admitting: Family Medicine

## 2020-03-20 ENCOUNTER — Encounter: Payer: Self-pay | Admitting: Family Medicine

## 2020-03-20 VITALS — BP 135/73 | HR 77 | Temp 97.6°F | Ht 65.0 in | Wt 222.0 lb

## 2020-03-20 DIAGNOSIS — K754 Autoimmune hepatitis: Secondary | ICD-10-CM

## 2020-03-20 DIAGNOSIS — E785 Hyperlipidemia, unspecified: Secondary | ICD-10-CM | POA: Diagnosis not present

## 2020-03-20 DIAGNOSIS — E1169 Type 2 diabetes mellitus with other specified complication: Secondary | ICD-10-CM | POA: Diagnosis not present

## 2020-03-20 LAB — BAYER DCA HB A1C WAIVED: HB A1C (BAYER DCA - WAIVED): 8 % — ABNORMAL HIGH (ref <7.0)

## 2020-03-20 MED ORDER — METFORMIN HCL 1000 MG PO TABS: 1000.0000 mg | ORAL_TABLET | Freq: Two times a day (BID) | ORAL | 3 refills | Status: DC

## 2020-03-20 NOTE — Progress Notes (Signed)
Subjective: CC: DM PCP: [name], DO NMM:HWKG Taylor Leach is a 64 y.o. female presenting to clinic today for:  1. Type 2 Diabetes w/ hypertension and hyperlipidemia:  High at home: 200; Low at home: 120, Taking medication(s): Metformin 1000 mg twice daily, HCTZ.  She is not on a statin due to the autoimmune hepatitis.  Last eye exam: Up-to-date Last foot exam: Up-to-date Last A1c:  Lab Results  Component Value Date   HGBA1C 7.7 (H) 12/23/2019   Nephropathy screen indicated?:  She will come in next week to have collected Last flu, zoster and/or pneumovax:  Immunization History  Administered Date(s) Administered   Hep A / Hep B 03/24/2008, 04/23/2008, 09/08/2008   Influenza, Seasonal, Injecte, Preservative Fre 01/10/2008   Influenza,inj,Quad PF,6+ Mos 05/18/2016   Influenza-Unspecified 02/02/2018   PFIZER SARS-COV-2 Vaccination 11/08/2019, 11/29/2019   Pneumococcal Polysaccharide-23 11/30/2017   Zoster Recombinat (Shingrix) 12/12/2018    ROS: Denies dizziness, LOC, polyuria, polydipsia, unintended weight loss/gain, foot ulcerations, numbness or tingling in extremities, shortness of breath or chest pain.    ROS: Per HPI  Allergies  Allergen Reactions   Fenofibrate Micronized Other (See Comments)    Caused increased LFT's Caused increased LFT's   Codeine    Hydrocodone Nausea And Vomiting and Other (See Comments)    Makes body hot.    Hydrocodone-Acetaminophen Nausea And Vomiting   Past Medical History:  Diagnosis Date   Arthritis    Autoimmune hepatitis ([place])    Chest pain    Stress echo normal, October, 2012   Dyslipidemia    Ejection fraction    EF 55-60%, echo, October, 2012   GERD (gastroesophageal reflux disease)     Current Outpatient Medications:    blood glucose meter kit and supplies KIT, Dispense based on patient and insurance preference. Use up to four times daily as directed. (FOR ICD-9 250.00, 250.01). E11.9, Disp: 1 each,  Rfl: 0   budesonide (ENTOCORT EC) 3 MG 24 hr capsule, Take 3 mg by mouth 2 (two) times daily., Disp: , Rfl:    Clobetasol Prop Emollient Base 0.05 % emollient cream, Apply 1 application topically 2 (two) times daily., Disp: 60 g, Rfl: 2   CONTOUR NEXT TEST test strip, CHECK BLOOD SUGAR UP TO 4 TIMES A DAY OR AS DIRECTED, Disp: 400 each, Rfl: 2   fluorouracil (EFUDEX) 5 % cream, Apply topically 2 (two) times daily., Disp: , Rfl:    fluticasone (FLONASE) 50 MCG/ACT nasal spray, Place 2 sprays into both nostrils daily. Sinus Allergies, Disp: 16 g, Rfl: 11   hydrochlorothiazide (HYDRODIURIL) 25 MG tablet, Take 1 tablet (25 mg total) by mouth daily., Disp: 30 tablet, Rfl: 3   mycophenolate (MYFORTIC) 360 MG TBEC EC tablet, Take 360 mg by mouth 2 (two) times daily., Disp: , Rfl:    nystatin cream (MYCOSTATIN), Apply 1 application topically 2 (two) times daily. x2-4 weeks, Disp: 60 g, Rfl: 1   pantoprazole (PROTONIX) 40 MG tablet, TAKE 1 TABLET DAILY, Disp: 90 tablet, Rfl: 3   SitaGLIPtin-MetFORMIN HCl (JANUMET XR) 50-1000 MG TB24, Take 1 tablet by mouth daily., Disp: 90 tablet, Rfl: 1 Social History   Socioeconomic History   Marital status: Married    Spouse name: Not on file   Number of children: Not on file   Years of education: Not on file   Highest education level: Not on file  Occupational History   Not on file  Tobacco Use   Smoking status: Former Smoker  Packs/day: 1.00   Smokeless tobacco: Never Used  Vaping Use   Vaping Use: Never used  Substance and Sexual Activity   Alcohol use: Yes    Alcohol/week: 1.0 standard drink    Types: 1 Glasses of wine per week   Drug use: No   Sexual activity: Yes    Birth control/protection: Surgical  Other Topics Concern   Not on file  Social History Narrative   Not on file   Social Determinants of Health   Financial Resource Strain: Not on file  Food Insecurity: Not on file  Transportation Needs: Not on file   Physical Activity: Not on file  Stress: Not on file  Social Connections: Not on file  Intimate Partner Violence: Not on file   Family History  Problem Relation Age of Onset   Cancer Mother    Cancer Father     Objective: Office vital signs reviewed. BP 135/73    Pulse 77    Temp 97.6 F (36.4 C) (Temporal)    Ht _0  (1.651 m)    Wt 222 lb (100.7 kg)    SpO2 97%    BMI 36.94 kg/m   Physical Examination:  General: Awake, alert, well nourished, No acute distress HEENT: Normal; sclera white Cardio: regular rate and rhythm, S1S2 heard, no murmurs appreciated Pulm: clear to auscultation bilaterally, no wheezes, rhonchi or rales; normal work of breathing on room air Extremities: warm, well perfused, No edema, cyanosis or clubbing; +2 pulses bilaterally MSK: Ambulating independently Skin: Multiple actinic keratoses noted along upper extremity  Assessment/ Plan: 64 y.o. female   Type 2 diabetes mellitus with other specified complication, without long-term current use of insulin (HCC) - Plan: Bayer DCA Hb A1c Waived, CMP14+EGFR, Microalbumin / creatinine urine ratio, CANCELED: Microalbumin / creatinine urine ratio  Hyperlipidemia associated with type 2 diabetes mellitus ([place]) - Plan: Lipid Panel, CMP14+EGFR  Sugar thought to be improving with A1c of 7.1 but apparently this was a false reading.  Her A1c is up to 8.0, indicating worsening blood sugar.  I am going to have Almyra Free reach out to her with alternatives since she was intolerant to the [place].  May consider adding something like [place] or [place].  Would also like some input on medications that we can safely use given her known autoimmune hepatitis.  I placed her back on her Metformin 1000 mg twice daily, highly encouraged her to cut back carbs and increase physical activity.  We may be able to get her at goal without adding additional medications.  She will come in for urine microalbumin in about a week when she has a day  off.  Fasting lipid panel was obtained today.  She will come back in 3 months, sooner if needed   Orders Placed This Encounter  Procedures   Bayer DCA Hb A1c Waived   Lipid Panel   CMP14+EGFR   Microalbumin / creatinine urine ratio   No orders of the defined types were placed in this encounter.    [name], DO [place] [phone]

## 2020-03-20 NOTE — Progress Notes (Signed)
Pt called and appt with Raynelle Fanning made.

## 2020-03-20 NOTE — Patient Instructions (Signed)
Sugar does look better than last visit. Not sure how much janumet impacted this but I think at this point if you can add a little exercise or cut back your carb intake a little more this will help you stay off additional medication.  Your GOAL <7.0.  You are nearly there.

## 2020-03-21 LAB — CMP14+EGFR
ALT: 94 IU/L — ABNORMAL HIGH (ref 0–32)
AST: 59 IU/L — ABNORMAL HIGH (ref 0–40)
Albumin/Globulin Ratio: 1.2 (ref 1.2–2.2)
Albumin: 4.1 g/dL (ref 3.8–4.8)
Alkaline Phosphatase: 71 IU/L (ref 44–121)
BUN/Creatinine Ratio: 12 (ref 12–28)
BUN: 11 mg/dL (ref 8–27)
Bilirubin Total: 0.5 mg/dL (ref 0.0–1.2)
CO2: 21 mmol/L (ref 20–29)
Calcium: 9.5 mg/dL (ref 8.7–10.3)
Chloride: 102 mmol/L (ref 96–106)
Creatinine, Ser: 0.91 mg/dL (ref 0.57–1.00)
GFR calc Af Amer: 78 mL/min/{1.73_m2} (ref >59)
GFR calc non Af Amer: 67 mL/min/{1.73_m2} (ref >59)
Globulin, Total: 3.5 g/dL (ref 1.5–4.5)
Glucose: 148 mg/dL — ABNORMAL HIGH (ref 65–99)
Potassium: 3.9 mmol/L (ref 3.5–5.2)
Sodium: 140 mmol/L (ref 134–144)
Total Protein: 7.6 g/dL (ref 6.0–8.5)

## 2020-03-21 LAB — LIPID PANEL
Chol/HDL Ratio: 3.9 ratio (ref 0.0–4.4)
Cholesterol, Total: 213 mg/dL — ABNORMAL HIGH (ref 100–199)
HDL: 55 mg/dL (ref >39)
LDL Chol Calc (NIH): 129 mg/dL — ABNORMAL HIGH (ref 0–99)
Triglycerides: 163 mg/dL — ABNORMAL HIGH (ref 0–149)
VLDL Cholesterol Cal: 29 mg/dL (ref 5–40)

## 2020-03-25 ENCOUNTER — Encounter: Payer: Self-pay | Admitting: Family Medicine

## 2020-03-25 ENCOUNTER — Other Ambulatory Visit: Payer: Self-pay | Admitting: Family Medicine

## 2020-03-25 DIAGNOSIS — K754 Autoimmune hepatitis: Secondary | ICD-10-CM

## 2020-03-30 ENCOUNTER — Other Ambulatory Visit: Payer: Self-pay

## 2020-03-30 ENCOUNTER — Other Ambulatory Visit: Payer: BC Managed Care – PPO

## 2020-03-30 DIAGNOSIS — K754 Autoimmune hepatitis: Secondary | ICD-10-CM | POA: Diagnosis not present

## 2020-03-31 DIAGNOSIS — Z6836 Body mass index (BMI) 36.0-36.9, adult: Secondary | ICD-10-CM | POA: Diagnosis not present

## 2020-03-31 DIAGNOSIS — J029 Acute pharyngitis, unspecified: Secondary | ICD-10-CM | POA: Diagnosis not present

## 2020-03-31 DIAGNOSIS — R519 Headache, unspecified: Secondary | ICD-10-CM | POA: Diagnosis not present

## 2020-03-31 DIAGNOSIS — J329 Chronic sinusitis, unspecified: Secondary | ICD-10-CM | POA: Diagnosis not present

## 2020-03-31 LAB — CBC WITH DIFFERENTIAL/PLATELET
Basophils Absolute: 0 10*3/uL (ref 0.0–0.2)
Basos: 0 %
EOS (ABSOLUTE): 0.1 10*3/uL (ref 0.0–0.4)
Eos: 1 %
Hematocrit: 42.1 % (ref 34.0–46.6)
Hemoglobin: 14.4 g/dL (ref 11.1–15.9)
Immature Grans (Abs): 0 10*3/uL (ref 0.0–0.1)
Immature Granulocytes: 1 %
Lymphocytes Absolute: 1.3 10*3/uL (ref 0.7–3.1)
Lymphs: 21 %
MCH: 31.7 pg (ref 26.6–33.0)
MCHC: 34.2 g/dL (ref 31.5–35.7)
MCV: 93 fL (ref 79–97)
Monocytes Absolute: 0.7 10*3/uL (ref 0.1–0.9)
Monocytes: 12 %
Neutrophils Absolute: 4 10*3/uL (ref 1.4–7.0)
Neutrophils: 65 %
Platelets: 171 10*3/uL (ref 150–450)
RBC: 4.54 x10E6/uL (ref 3.77–5.28)
RDW: 12.3 % (ref 11.7–15.4)
WBC: 6.2 10*3/uL (ref 3.4–10.8)

## 2020-03-31 LAB — IGG: IgG (Immunoglobin G), Serum: 1630 mg/dL — ABNORMAL HIGH (ref 586–1602)

## 2020-04-06 ENCOUNTER — Encounter: Payer: BC Managed Care – PPO | Admitting: Pharmacist

## 2020-04-06 NOTE — Progress Notes (Signed)
This encounter was created in error - please disregard.

## 2020-04-14 ENCOUNTER — Ambulatory Visit (INDEPENDENT_AMBULATORY_CARE_PROVIDER_SITE_OTHER): Payer: BC Managed Care – PPO | Admitting: Pharmacist

## 2020-04-14 DIAGNOSIS — E119 Type 2 diabetes mellitus without complications: Secondary | ICD-10-CM

## 2020-04-14 NOTE — Progress Notes (Signed)
    04/14/2020 Name: Taylor Leach MRN: [id number] DOB: 04-06-1955   S:  43 yoF Presents for diabetes evaluation, education, and management Patient was referred and last seen by Primary Care Provider on 03/2020.  Insurance coverage/medication affordability: BCBS  Patient denies adherence with medications. . Current diabetes medications include: metformin (GI side effects) . Current hypertension medications include: n/a Goal 130/80 . Current hyperlipidemia medications include: n/a-past elevated LFTS/liver IgG DX  Patient denies hypoglycemic events.   O:  Lab Results  Component Value Date   HGBA1C 8.0 (H) 03/20/2020   Lipid Panel     Component Value Date/Time   CHOL 213 (H) 03/20/2020 0841   TRIG 163 (H) 03/20/2020 0841   HDL 55 03/20/2020 0841   CHOLHDL 3.9 03/20/2020 0841   CHOLHDL 4.1 10/23/2010 1748   VLDL 40 10/23/2010 1748   LDLCALC 129 (H) 03/20/2020 0841    Home fasting blood sugars: n/a  2 hour post-meal/random blood sugars: n/a.   Clinical Atherosclerotic Cardiovascular Disease (ASCVD): No   The 10-year ASCVD risk score Denman George DC Jr., et al., 2013) is: 17.5%   Values used to calculate the score:     Age: 89 years     Sex: Female     Is Non-Hispanic African American: No     Diabetic: Yes     Tobacco smoker: No     Systolic Blood Pressure: 158 mmHg     Is BP treated: Yes     HDL Cholesterol: 55 mg/dL     Total Cholesterol: 213 mg/dL    A/P:  Diabetes Y1EH currently uncontrolled. Patient is not adherent with medication. Control is suboptimal due to GI side effects from metformin, intolerance to past medications.  Patient has tried and failed the following: -Glipizide-->hypoglycemia -Januvia-->stomach pains -Farxiga--> bladder pain  -Currently having GI adverse effects on metformin, unable to take full dose as prescribed.  -HOLD metformin  -START Rybelsus 3mg  daily   Patient denies history of thyroid cancer  Sample given for patient to  try  Encouraged patient to call back next week to see how medication is going  copay card given for refills as needed  -Extensively discussed pathophysiology of diabetes, recommended lifestyle interventions, dietary effects on blood sugar control  -Counseled on s/sx of and management of hypoglycemia  -Next A1C anticipated 3 months.  Written patient instructions provided.  Total time in counseling 15 minutes.   Follow up PCP Clinic Visit in 66months.   1month, PharmD, BCPS Clinical Pharmacist, [facility]  II Phone [phone]

## 2020-04-28 ENCOUNTER — Encounter: Payer: Self-pay | Admitting: Family Medicine

## 2020-04-29 DIAGNOSIS — B078 Other viral warts: Secondary | ICD-10-CM | POA: Diagnosis not present

## 2020-04-29 DIAGNOSIS — B354 Tinea corporis: Secondary | ICD-10-CM | POA: Diagnosis not present

## 2020-04-29 DIAGNOSIS — L570 Actinic keratosis: Secondary | ICD-10-CM | POA: Diagnosis not present

## 2020-05-08 ENCOUNTER — Telehealth: Payer: Self-pay

## 2020-05-08 IMAGING — US US THYROID
1 series · 13 of 25 positions shown · non-contrast
Comparison: 08/10/2016

CLINICAL DATA: Nodule, follow-up

EXAM:
THYROID ULTRASOUND
TECHNIQUE: Ultrasound examination of the thyroid gland and adjacent soft
tissues was performed.

[Series 1: us thyroid · 0.07mm/px · 13 of 65 slices shown]
[im 1/65]
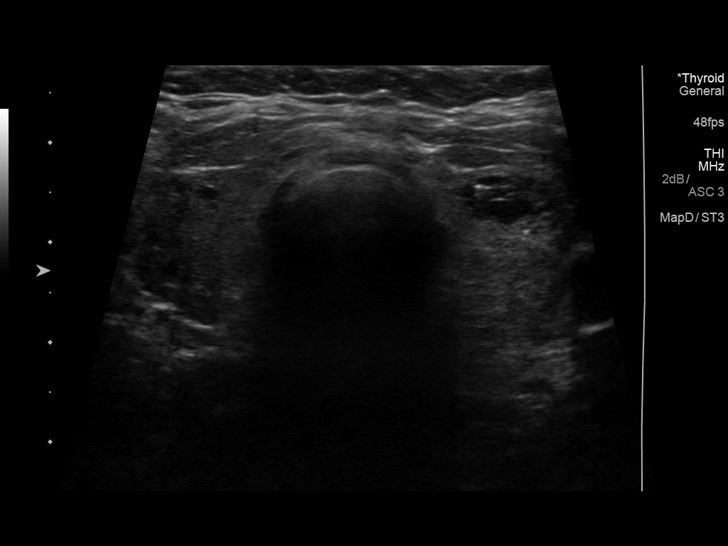
[im 6/65]
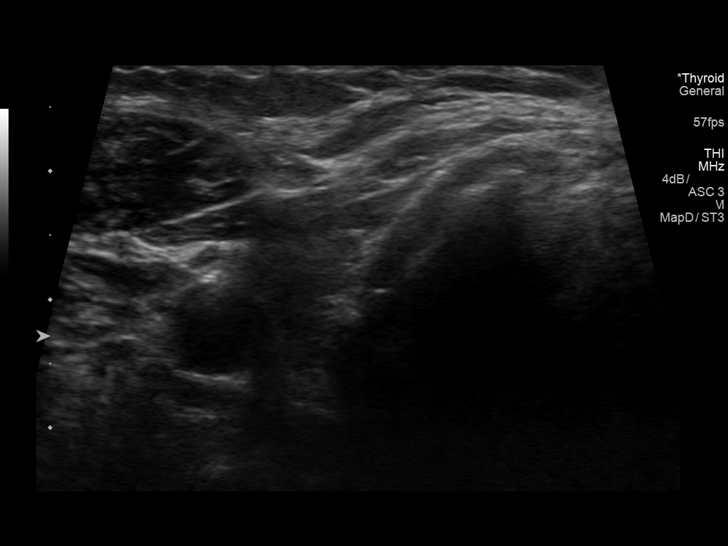
[im 11/65]
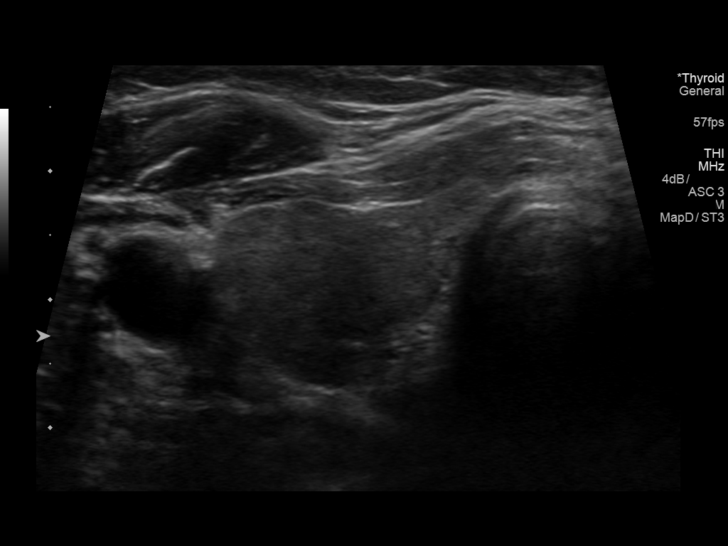
[im 17/65]
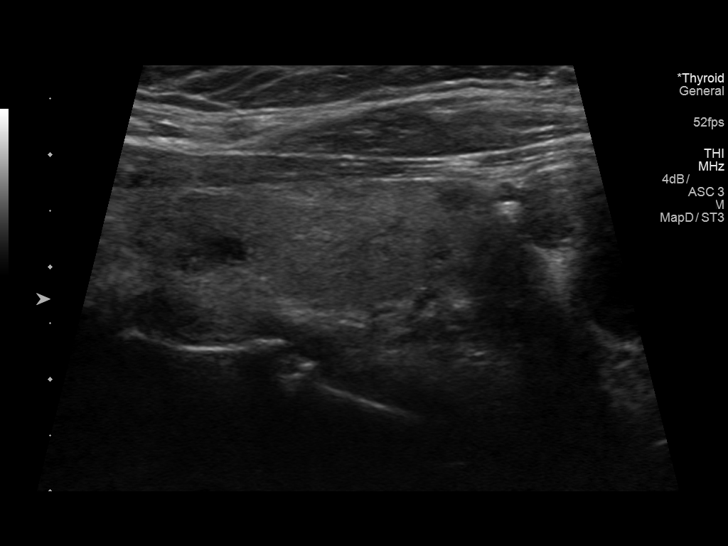
[im 22/65]
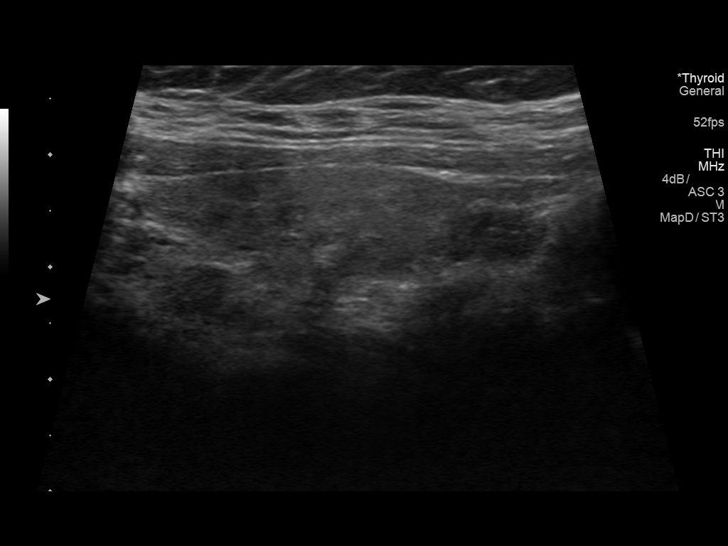
[im 27/65]
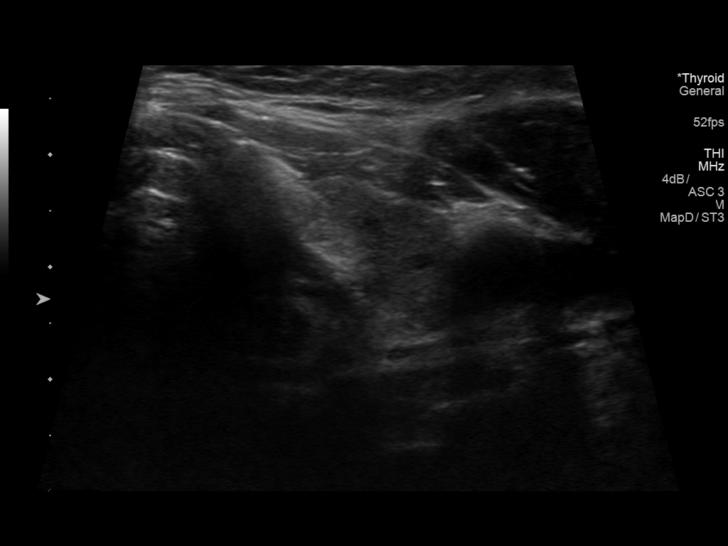
[im 33/65]
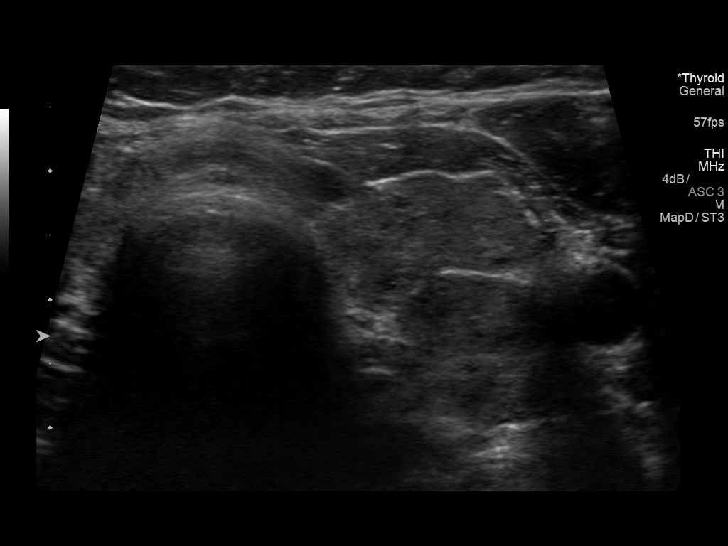
[im 38/65]
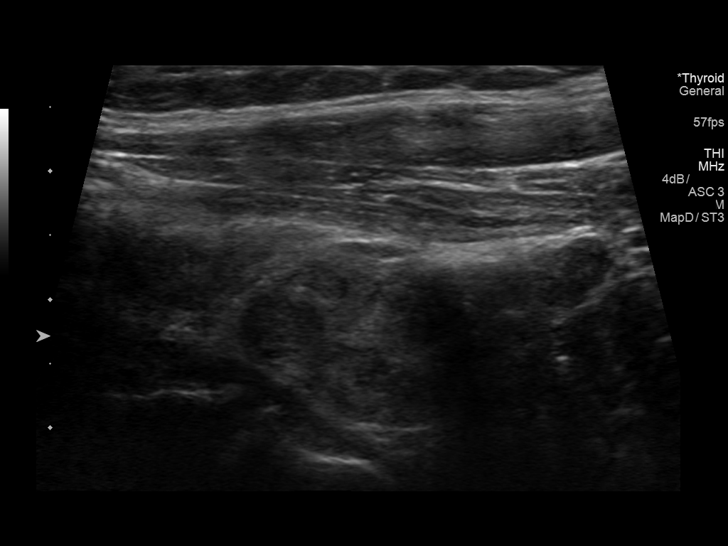
[im 43/65]
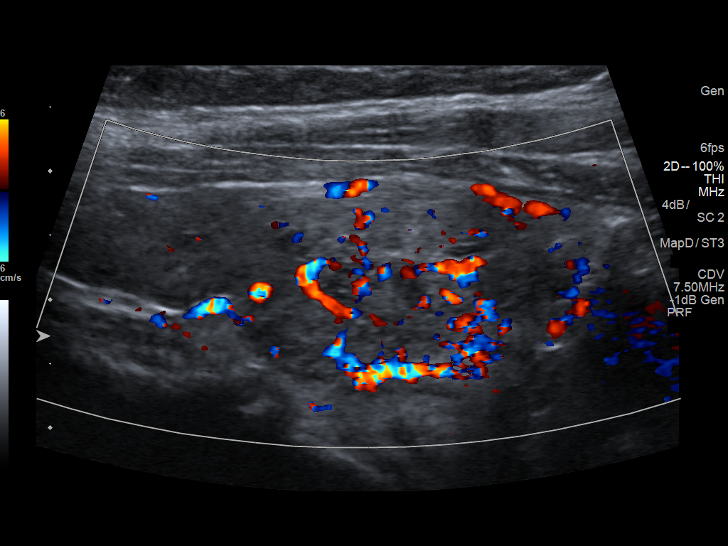
[im 49/65]
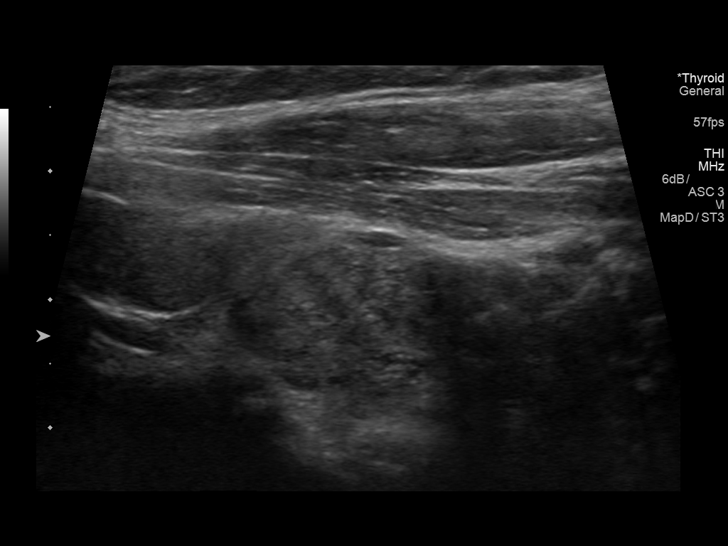
[im 54/65]
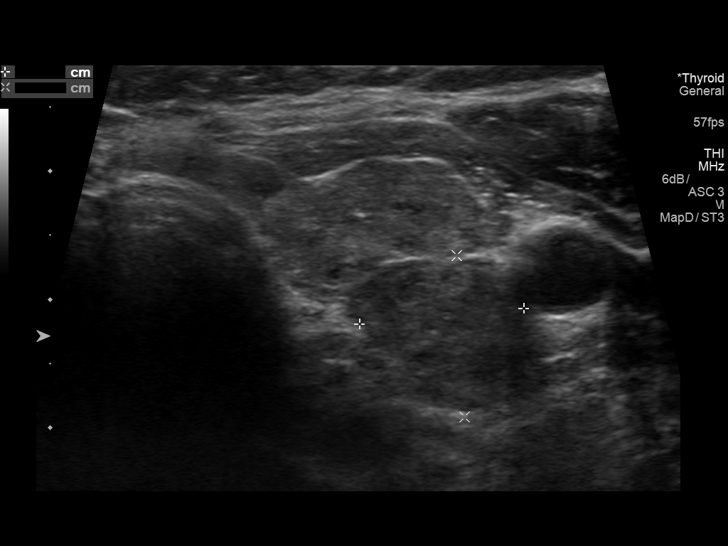
[im 59/65]
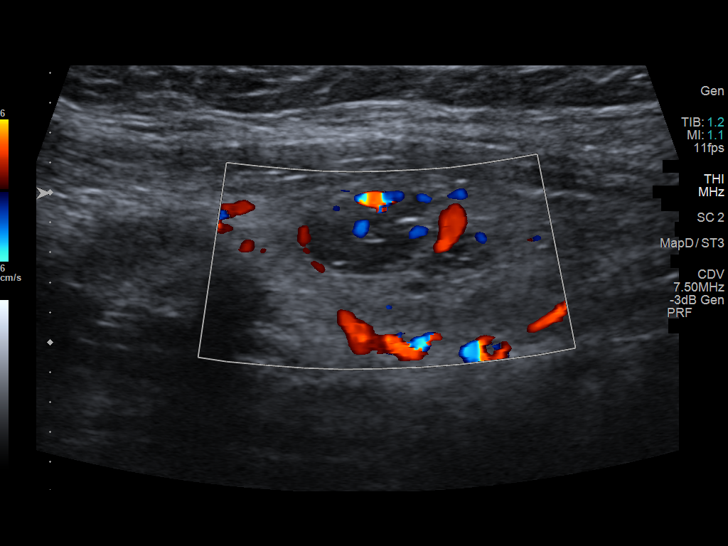
[im 65/65]
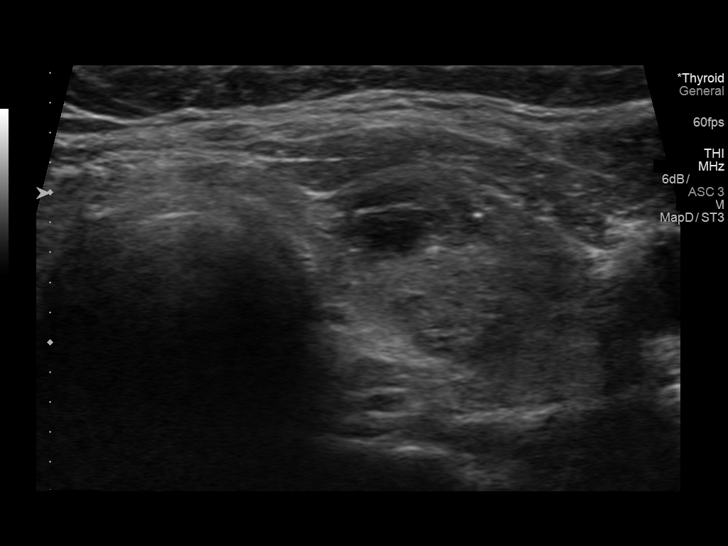

[13 of 25 positions shown; findings below may reference images not displayed]

FINDINGS: Parenchymal Echotexture: Moderately heterogenous

Isthmus: 0.4 cm thickness, previously

Right lobe: 4.2 x 1.4 x 1.8 cm, previously 4.1 x 1.4 x

Left lobe: 4.2 x 1.7 x 1.7 cm, previously 4.8 x 1.7 x

_________________________________________________________

Estimated total number of nodules >/= 1 cm: 2

Number of spongiform nodules >/=  2 cm not described below (TR1): 0

Number of mixed cystic and solid nodules >/= 1.5 cm not described
below (TR2): 0

_________________________________________________________

Nodule # 1:

Prior biopsy: No

Location: Left; inferior

Maximum size: 1.8 cm; Other 2 dimensions: 1.3 x 1.3 cm, previously,
2 x 1.2 x 1.2 cm

Composition: solid/almost completely solid (2)

Echogenicity: isoechoic (1)

Shape: not taller-than-wide (0)

Margins: smooth (0)

Echogenic foci: none (0)

ACR TI-RADS total points: 3.

ACR TI-RADS risk category:  TR3 (3 points).

Significant change in size (>/= 20% in two dimensions and minimal
increase of 2 mm): No

Change in features: No

Change in ACR TI-RADS risk category: No

ACR TI-RADS recommendations:

*Given size (>/= 1.5 - 2.4 cm) and appearance, a follow-up
ultrasound in 1 year should be considered based on TI-RADS criteria.

_________________________________________________________

Nodule # 2:

Prior biopsy: No

Location: Left; Mid

Maximum size: 1.3 cm; Other 2 dimensions: 1.2 x 0.5 cm, previously,
0.8 x 0.4 cm

Composition: mixed cystic and solid (1)

Echogenicity: hypoechoic (2)

Shape: not taller-than-wide (0)

Margins: ill-defined (0)

Echogenic foci: none (0)

ACR TI-RADS total points: 3.

ACR TI-RADS risk category:  TR3 (3 points).

Significant change in size (>/= 20% in two dimensions and minimal
increase of 2 mm): Yes

Change in features: No

Change in ACR TI-RADS risk category: No

ACR TI-RADS recommendations:

Given size (<1.4 cm) and appearance, this nodule does NOT meet
TI-RADS criteria for biopsy or dedicated follow-up.

_________________________________________________________
IMPRESSION: 1. Normal-sized thyroid with stable nodules. None meets criteria for
biopsy.
2. Recommend annual/biennial ultrasound follow-up of inferior left
nodule, until stability x5 years confirmed.

The above is in keeping with the ACR TI-RADS recommendations - [HOSPITAL] 9661;[DATE].

## 2020-05-08 MED ORDER — RYBELSUS 3 MG PO TABS: 3.0000 mg | ORAL_TABLET | Freq: Every day | ORAL | 0 refills | Status: DC

## 2020-05-08 NOTE — Telephone Encounter (Signed)
  Prescription Request  05/08/2020  What is the name of the medication or equipment? rebellsis  Have you contacted your pharmacy to request a refill? (if applicable) no  Which pharmacy would you like this sent to? [facility]   Patient notified that their request is being sent to the clinical staff for review and that they should receive a response within 2 business days.

## 2020-05-12 ENCOUNTER — Telehealth: Payer: Self-pay

## 2020-05-12 ENCOUNTER — Other Ambulatory Visit: Payer: Self-pay

## 2020-05-12 ENCOUNTER — Encounter: Payer: Self-pay | Admitting: Family Medicine

## 2020-05-12 MED ORDER — RYBELSUS 3 MG PO TABS: 3.0000 mg | ORAL_TABLET | Freq: Every day | ORAL | 3 refills | Status: DC

## 2020-05-12 NOTE — Telephone Encounter (Signed)
PA approved Effective from 05/12/2020 through 05/11/2021.  Pharmacy and patient aware.

## 2020-05-12 NOTE — Telephone Encounter (Signed)
PA in process  (Key: [id number]) Rybelsus 3MG  tablets   Form Blue Form (CB)

## 2020-06-06 DIAGNOSIS — Z1231 Encounter for screening mammogram for malignant neoplasm of breast: Secondary | ICD-10-CM | POA: Diagnosis not present

## 2020-06-18 ENCOUNTER — Other Ambulatory Visit: Payer: Self-pay | Admitting: Family Medicine

## 2020-06-18 DIAGNOSIS — K219 Gastro-esophageal reflux disease without esophagitis: Secondary | ICD-10-CM

## 2020-06-19 ENCOUNTER — Ambulatory Visit (INDEPENDENT_AMBULATORY_CARE_PROVIDER_SITE_OTHER): Payer: BC Managed Care – PPO | Admitting: Family Medicine

## 2020-06-19 ENCOUNTER — Other Ambulatory Visit: Payer: Self-pay

## 2020-06-19 VITALS — BP 139/71 | HR 67 | Temp 97.5°F | Ht 65.0 in | Wt 211.0 lb

## 2020-06-19 DIAGNOSIS — E785 Hyperlipidemia, unspecified: Secondary | ICD-10-CM

## 2020-06-19 DIAGNOSIS — E1159 Type 2 diabetes mellitus with other circulatory complications: Secondary | ICD-10-CM | POA: Diagnosis not present

## 2020-06-19 DIAGNOSIS — E1169 Type 2 diabetes mellitus with other specified complication: Secondary | ICD-10-CM | POA: Diagnosis not present

## 2020-06-19 DIAGNOSIS — K760 Fatty (change of) liver, not elsewhere classified: Secondary | ICD-10-CM | POA: Diagnosis not present

## 2020-06-19 DIAGNOSIS — L570 Actinic keratosis: Secondary | ICD-10-CM | POA: Diagnosis not present

## 2020-06-19 DIAGNOSIS — I152 Hypertension secondary to endocrine disorders: Secondary | ICD-10-CM

## 2020-06-19 LAB — BAYER DCA HB A1C WAIVED: HB A1C (BAYER DCA - WAIVED): 7.3 % — ABNORMAL HIGH (ref <7.0)

## 2020-06-19 MED ORDER — RYBELSUS 7 MG PO TABS
1.0000 | ORAL_TABLET | Freq: Every day | ORAL | 0 refills | Status: DC
Start: 2020-06-19 — End: 2020-09-24

## 2020-06-19 NOTE — Patient Instructions (Signed)
Please call/ message me with your sugars in about 3 weeks.  Will plan to send the 14mg  daily of Rybelsus pending those readings.  You had labs performed today.  You will be contacted with the results of the labs once they are available, usually in the next 3 business days for routine lab work.  If you have an active my chart account, they will be released to your MyChart.  If you prefer to have these labs released to you via telephone, please let know.  If you had a pap smear or biopsy performed, expect to be contacted in about 7-10 days.

## 2020-06-19 NOTE — Progress Notes (Signed)
Subjective: CC: DM PCP: [name], DO PJS:RPRX Taylor Leach is a 65 y.o. female presenting to clinic today for:  1. Type 2 Diabetes with hypertension and hyperlipidemia:  Patient has totally transitioned off of Metformin and onto Rybelsus.  She has been out of the medicine for the last couple of days because she anticipated that we would be sending in a new prescription with a new dose.  She has tolerated the Rybelsus without difficulty and in fact noticed resolution of her GI symptoms that she been experiencing with Metformin.  She is lost weight and is really been trying to cut back.  She is essentially eliminated processed foods.  She is trying to work on improving her fatty liver disease.  Last eye exam: UTD Last foot exam: UTD Last A1c:  Lab Results  Component Value Date   HGBA1C 8.0 (H) 03/20/2020   Nephropathy screen indicated?: UTD Last flu, zoster and/or pneumovax:  Immunization History  Administered Date(s) Administered  . Hep A / Hep B 03/24/2008, 04/23/2008, 09/08/2008  . Influenza, Seasonal, Injecte, Preservative Fre 01/10/2008  . Influenza,inj,Quad PF,6+ Mos 05/18/2016  . Influenza-Unspecified 02/02/2018  . PFIZER(Purple Top)SARS-COV-2 Vaccination 11/08/2019, 11/29/2019  . Pneumococcal Polysaccharide-23 11/30/2017  . Zoster Recombinat (Shingrix) 12/12/2018    ROS: She occasionally has abdominal pain in the right upper quadrant.  No reports of any chest pain, shortness of breath, dizziness.   2. Autoimmune hepatitis Compliant with her Myfortic and Entocort.  She has unfortunately many actinic keratosis which have been treated with cryotherapy and now Efudex cream.  The lesions tend to resolve but then she develops new ones.  She attributes this to the medication she is using for hepatitis.  Reports occasional right upper quadrant fullness.  Tolerating p.o. intake without difficulty.  No reports of jaundice of the skin.  Has follow-up with Dr. Drue Novel on 11  April  ROS: Per HPI  Allergies  Allergen Reactions  . Fenofibrate Micronized Other (See Comments)    Caused increased LFT's Caused increased LFT's  . Codeine   . [place] [Dapagliflozin]     Bladder pains  . Hydrocodone Nausea And Vomiting and Other (See Comments)    Makes body hot.   . Hydrocodone-Acetaminophen Nausea And Vomiting  . Janumet [Sitagliptin-Metformin Hcl]     Extremely bad gas    Past Medical History:  Diagnosis Date  . Arthritis   . Autoimmune hepatitis ([place])   . Chest pain    Stress echo normal, October, 2012  . Dyslipidemia   . Ejection fraction    EF 55-60%, echo, October, 2012  . GERD (gastroesophageal reflux disease)     Current Outpatient Medications:  .  blood glucose meter kit and supplies KIT, Dispense based on patient and insurance preference. Use up to four times daily as directed. (FOR ICD-9 250.00, 250.01). E11.9, Disp: 1 each, Rfl: 0 .  budesonide (ENTOCORT EC) 3 MG 24 hr capsule, Take 3 mg by mouth 2 (two) times daily., Disp: , Rfl:  .  Clobetasol Prop Emollient Base 0.05 % emollient cream, Apply 1 application topically 2 (two) times daily. (Patient not taking: Reported on 03/20/2020), Disp: 60 g, Rfl: 2 .  CONTOUR NEXT TEST test strip, CHECK BLOOD SUGAR UP TO 4 TIMES A DAY OR AS DIRECTED, Disp: 400 each, Rfl: 2 .  fluorouracil (EFUDEX) 5 % cream, Apply topically 2 (two) times daily., Disp: , Rfl:  .  fluticasone (FLONASE) 50 MCG/ACT nasal spray, Place 2 sprays into both nostrils daily.  Sinus Allergies, Disp: 16 g, Rfl: 11 .  metFORMIN (GLUCOPHAGE) 1000 MG tablet, Take 1 tablet (1,000 mg total) by mouth 2 (two) times daily with a meal., Disp: 180 tablet, Rfl: 3 .  mycophenolate (MYFORTIC) 360 MG TBEC EC tablet, Take 360 mg by mouth 2 (two) times daily., Disp: , Rfl:  .  pantoprazole (PROTONIX) 40 MG tablet, TAKE 1 TABLET DAILY, Disp: 30 tablet, Rfl: 0 .  Semaglutide (RYBELSUS) 3 MG TABS, Take 3 mg by mouth daily., Disp: 90 tablet, Rfl: 3 Social  History   Socioeconomic History  . Marital status: Married    Spouse name: Not on file  . Number of children: Not on file  . Years of education: Not on file  . Highest education level: Not on file  Occupational History  . Not on file  Tobacco Use  . Smoking status: Former Smoker    Packs/day: 1.00  . Smokeless tobacco: Never Used  Vaping Use  . Vaping Use: Never used  Substance and Sexual Activity  . Alcohol use: Yes    Alcohol/week: 1.0 standard drink    Types: 1 Glasses of wine per week  . Drug use: No  . Sexual activity: Yes    Birth control/protection: Surgical  Other Topics Concern  . Not on file  Social History Narrative  . Not on file   Social Determinants of Health   Financial Resource Strain: Not on file  Food Insecurity: Not on file  Transportation Needs: Not on file  Physical Activity: Not on file  Stress: Not on file  Social Connections: Not on file  Intimate Partner Violence: Not on file   Family History  Problem Relation Age of Onset  . Cancer Mother   . Cancer Father     Objective: Office vital signs reviewed. BP 139/71   Pulse 67   Temp (!) 97.5 F (36.4 C) (Temporal)   Ht '5\' 5"'  (1.651 m)   Wt 211 lb (95.7 kg)   SpO2 100%   BMI 35.11 kg/m   Physical Examination:  General: Awake, alert, well nourished, No acute distress HEENT: Normal; sclera white Cardio: regular rate and rhythm, S1S2 heard, no murmurs appreciated Pulm: clear to auscultation bilaterally, no wheezes, rhonchi or rales; normal work of breathing on room air GI: soft, non-tender, non-distended, bowel sounds present x4, no hepatomegaly, no splenomegaly, no masses Extremities: warm, well perfused, No edema, cyanosis or clubbing; +2 pulses bilaterally Skin: Multiple actinic keratoses noted in the upper extremity, lower extremity and chest region; small soft tissue cyst appreciated in the right upper quadrant just at the rib edge  Assessment/ Plan: 65 y.o. female   Type 2  diabetes mellitus with other specified complication, without long-term current use of insulin (HCC) - Plan: Bayer DCA Hb A1c Waived, Microalbumin / creatinine urine ratio, Semaglutide (RYBELSUS) 7 MG TABS  Hyperlipidemia associated with type 2 diabetes mellitus (HCC)  Hypertension associated with diabetes ([place])  Nonalcoholic fatty liver disease without nonalcoholic steatohepatitis ([facility]) - Plan: IGG, CBC with Differential, Hepatic Function Panel  Actinic keratoses  A1c not at goal at 7.3 today.  I anticipated that we would need advanced Rybelsus and therefore this has been increased to 7 mg.  We discussed that typical dosing is 14 mg daily and she will contact me in 1 month with blood sugar readings so that we can decide if this needs to be changed.  Not currently treated with any antilipid medications.  She is on Rybelsus which hopefully will reduce  her cholesterol.  Would like to obtain a fasting lipid panel at next visit  Blood pressure is diet controlled.  IGG, CBC and hepatic function panel obtained and CCed to her hepatologist  Multiple actinic keratoses were noted.  She is on appropriate treatment for this.  Continue following up with specialty as directed  No orders of the defined types were placed in this encounter.  No orders of the defined types were placed in this encounter.    [name], DO [place] [phone]

## 2020-06-20 LAB — HEPATIC FUNCTION PANEL
ALT: 61 IU/L — ABNORMAL HIGH (ref 0–32)
AST: 40 IU/L (ref 0–40)
Albumin: 4.1 g/dL (ref 3.8–4.8)
Alkaline Phosphatase: 72 IU/L (ref 44–121)
Bilirubin Total: 0.4 mg/dL (ref 0.0–1.2)
Bilirubin, Direct: 0.13 mg/dL (ref 0.00–0.40)
Total Protein: 7.4 g/dL (ref 6.0–8.5)

## 2020-06-20 LAB — CBC WITH DIFFERENTIAL/PLATELET
Basophils Absolute: 0 10*3/uL (ref 0.0–0.2)
Basos: 1 %
EOS (ABSOLUTE): 0.1 10*3/uL (ref 0.0–0.4)
Eos: 2 %
Hematocrit: 44.4 % (ref 34.0–46.6)
Hemoglobin: 14.9 g/dL (ref 11.1–15.9)
Immature Grans (Abs): 0 10*3/uL (ref 0.0–0.1)
Immature Granulocytes: 1 %
Lymphocytes Absolute: 1.1 10*3/uL (ref 0.7–3.1)
Lymphs: 22 %
MCH: 30.9 pg (ref 26.6–33.0)
MCHC: 33.6 g/dL (ref 31.5–35.7)
MCV: 92 fL (ref 79–97)
Monocytes Absolute: 0.6 10*3/uL (ref 0.1–0.9)
Monocytes: 12 %
Neutrophils Absolute: 3.2 10*3/uL (ref 1.4–7.0)
Neutrophils: 62 %
Platelets: 152 10*3/uL (ref 150–450)
RBC: 4.82 x10E6/uL (ref 3.77–5.28)
RDW: 13 % (ref 11.7–15.4)
WBC: 5.1 10*3/uL (ref 3.4–10.8)

## 2020-06-20 LAB — MICROALBUMIN / CREATININE URINE RATIO
Creatinine, Urine: 163.4 mg/dL
Microalb/Creat Ratio: 6 mg/g creat (ref 0–29)
Microalbumin, Urine: 10.5 ug/mL

## 2020-06-20 LAB — IGG: IgG (Immunoglobin G), Serum: 1734 mg/dL — ABNORMAL HIGH (ref 586–1602)

## 2020-06-29 ENCOUNTER — Encounter: Payer: Self-pay | Admitting: Family Medicine

## 2020-07-13 DIAGNOSIS — L570 Actinic keratosis: Secondary | ICD-10-CM | POA: Diagnosis not present

## 2020-07-13 DIAGNOSIS — Z6835 Body mass index (BMI) 35.0-35.9, adult: Secondary | ICD-10-CM | POA: Diagnosis not present

## 2020-07-13 DIAGNOSIS — K754 Autoimmune hepatitis: Secondary | ICD-10-CM | POA: Diagnosis not present

## 2020-08-19 DIAGNOSIS — L570 Actinic keratosis: Secondary | ICD-10-CM | POA: Diagnosis not present

## 2020-08-19 DIAGNOSIS — L300 Nummular dermatitis: Secondary | ICD-10-CM | POA: Diagnosis not present

## 2020-08-19 DIAGNOSIS — D692 Other nonthrombocytopenic purpura: Secondary | ICD-10-CM | POA: Diagnosis not present

## 2020-08-20 ENCOUNTER — Other Ambulatory Visit: Payer: Self-pay | Admitting: Family Medicine

## 2020-08-20 DIAGNOSIS — K219 Gastro-esophageal reflux disease without esophagitis: Secondary | ICD-10-CM

## 2020-09-24 ENCOUNTER — Other Ambulatory Visit: Payer: Self-pay

## 2020-09-24 ENCOUNTER — Ambulatory Visit (INDEPENDENT_AMBULATORY_CARE_PROVIDER_SITE_OTHER): Payer: BC Managed Care – PPO | Admitting: Family Medicine

## 2020-09-24 ENCOUNTER — Encounter: Payer: Self-pay | Admitting: Family Medicine

## 2020-09-24 ENCOUNTER — Telehealth: Payer: Self-pay | Admitting: Pharmacist

## 2020-09-24 ENCOUNTER — Encounter: Payer: Self-pay | Admitting: Pharmacist

## 2020-09-24 VITALS — BP 139/88 | HR 64 | Temp 98.1°F | Ht 65.0 in | Wt 214.4 lb

## 2020-09-24 DIAGNOSIS — E785 Hyperlipidemia, unspecified: Secondary | ICD-10-CM

## 2020-09-24 DIAGNOSIS — E1159 Type 2 diabetes mellitus with other circulatory complications: Secondary | ICD-10-CM | POA: Diagnosis not present

## 2020-09-24 DIAGNOSIS — K754 Autoimmune hepatitis: Secondary | ICD-10-CM

## 2020-09-24 DIAGNOSIS — E041 Nontoxic single thyroid nodule: Secondary | ICD-10-CM

## 2020-09-24 DIAGNOSIS — R200 Anesthesia of skin: Secondary | ICD-10-CM | POA: Diagnosis not present

## 2020-09-24 DIAGNOSIS — Z6835 Body mass index (BMI) 35.0-35.9, adult: Secondary | ICD-10-CM

## 2020-09-24 DIAGNOSIS — R202 Paresthesia of skin: Secondary | ICD-10-CM

## 2020-09-24 DIAGNOSIS — E1169 Type 2 diabetes mellitus with other specified complication: Secondary | ICD-10-CM | POA: Diagnosis not present

## 2020-09-24 DIAGNOSIS — K760 Fatty (change of) liver, not elsewhere classified: Secondary | ICD-10-CM | POA: Diagnosis not present

## 2020-09-24 DIAGNOSIS — I152 Hypertension secondary to endocrine disorders: Secondary | ICD-10-CM

## 2020-09-24 LAB — BAYER DCA HB A1C WAIVED: HB A1C (BAYER DCA - WAIVED): 8.5 % — ABNORMAL HIGH (ref <7.0)

## 2020-09-24 MED ORDER — ALPHA-LIPOIC ACID 600 MG PO CAPS: 1.0000 | ORAL_CAPSULE | Freq: Every day | ORAL | 3 refills | Status: DC

## 2020-09-24 NOTE — Telephone Encounter (Signed)
Adding onglyza as a last resort before insulin 5mg  once daily Added to med list Intolerance to janumet was likely the metformin component  Will not call in to pharmacy until patient calls back with results/tolerance Discussed with PCP

## 2020-09-24 NOTE — Patient Instructions (Signed)
If your FASTING blood sugar is above 150 for 2 consecutive days, increase by 1 unit of your long acting insulin.  Example: Day 1: Blood sugar was 159 Day 2: Blood sugar was 205  Increase Lantus to 22 units Day 3: Blood sugar is 202 Day 4: Blood sugars 168  Increase Lantus to 23 units Day 5: Blood sugar is 105 Day 6: Blood sugars 145  Stick with 23 units.  Do not increase.

## 2020-09-24 NOTE — Progress Notes (Signed)
Subjective: CC: DM, Autoimmune hepatitis PCP: Taylor Norlander, DO VEH:MCNO Taylor Leach is a 65 y.o. female presenting to clinic today for:  1. Type 2 Diabetes with hypertension, hyperlipidemia:  Patient mitts that her blood sugar is likely not well controlled.  She is not able to continue the Rybelsus due to GI side effects.  She unfortunately has been intolerant to multiple medications including Farxiga, Janumet and glipizide.  She admits that she has not really been eating very well over the last month due to stressors surrounding her spouse's health.  Reports occasional polydipsia and polyuria but no visual disturbance.  No reports of chest pain or shortness of breath.  She does have chronic neuropathy of the feet that seems worse as of late.  Previously diagnosed with idiopathic neuropathy but now with uncontrolled diabetes.  She has cramps in her legs at nighttime.  Last eye exam: UTD Last foot exam: needs Last A1c:  Lab Results  Component Value Date   HGBA1C 7.3 (H) 06/19/2020   Nephropathy screen indicated?: UTD Last flu, zoster and/or pneumovax:  Immunization History  Administered Date(s) Administered   Hep A / Hep B 03/24/2008, 04/23/2008, 09/08/2008   Influenza, Seasonal, Injecte, Preservative Fre 01/10/2008   Influenza,inj,Quad PF,6+ Mos 05/18/2016   Influenza-Unspecified 02/02/2018   PFIZER(Purple Top)SARS-COV-2 Vaccination 11/08/2019, 11/29/2019   Pneumococcal Polysaccharide-23 11/30/2017   Zoster Recombinat (Shingrix) 12/12/2018, 06/12/2019    2. Hepatitis, autoimmune Patient continues to follow with Dr. Drue Novel.  She has not changed any medications as of late.  Patient is compliant with all medicines however.  Would like to get her labs done today and sent to her specialist.   ROS: Per HPI  Allergies  Allergen Reactions   Fenofibrate Micronized Other (See Comments)    Caused increased LFT's Caused increased LFT's   Codeine    [place] [Dapagliflozin]      Bladder pains   Hydrocodone Nausea And Vomiting and Other (See Comments)    Makes body hot.    Hydrocodone-Acetaminophen Nausea And Vomiting   Janumet [Sitagliptin-Metformin Hcl]     Extremely bad gas    Past Medical History:  Diagnosis Date   Arthritis    Autoimmune hepatitis ([place])    Chest pain    Stress echo normal, October, 2012   Dyslipidemia    Ejection fraction    EF 55-60%, echo, October, 2012   GERD (gastroesophageal reflux disease)     Current Outpatient Medications:    blood glucose meter kit and supplies KIT, Dispense based on patient and insurance preference. Use up to four times daily as directed. (FOR ICD-9 250.00, 250.01). E11.9, Disp: 1 each, Rfl: 0   budesonide (ENTOCORT EC) 3 MG 24 hr capsule, Take 3 mg by mouth 2 (two) times daily., Disp: , Rfl:    cetirizine (ZYRTEC) 10 MG tablet, Take by mouth., Disp: , Rfl:    CONTOUR NEXT TEST test strip, CHECK BLOOD SUGAR UP TO 4 TIMES A DAY OR AS DIRECTED, Disp: 400 each, Rfl: 2   fluorouracil (EFUDEX) 5 % cream, Apply topically 2 (two) times daily., Disp: , Rfl:    fluticasone (FLONASE) 50 MCG/ACT nasal spray, Place 2 sprays into both nostrils daily. Sinus Allergies, Disp: 16 g, Rfl: 11   mycophenolate (MYFORTIC) 360 MG TBEC EC tablet, Take 360 mg by mouth 2 (two) times daily., Disp: , Rfl:    pantoprazole (PROTONIX) 40 MG tablet, TAKE ONE TABLET BY MOUTH EVERY DAY, Disp: 30 tablet, Rfl: 0   Semaglutide (RYBELSUS)  7 MG TABS, Take 1 tablet by mouth daily before breakfast. Patient to call office for 74m dose pending BGs, Disp: 30 tablet, Rfl: 0 Social History   Socioeconomic History   Marital status: Married    Spouse name: Not on file   Number of children: Not on file   Years of education: Not on file   Highest education level: Not on file  Occupational History   Not on file  Tobacco Use   Smoking status: Former    Packs/day: 1.00    Pack years: 0.00    Types: Cigarettes   Smokeless tobacco: Never  Vaping Use    Vaping Use: Never used  Substance and Sexual Activity   Alcohol use: Yes    Alcohol/week: 1.0 standard drink    Types: 1 Glasses of wine per week   Drug use: No   Sexual activity: Yes    Birth control/protection: Surgical  Other Topics Concern   Not on file  Social History Narrative   Not on file   Social Determinants of Health   Financial Resource Strain: Not on file  Food Insecurity: Not on file  Transportation Needs: Not on file  Physical Activity: Not on file  Stress: Not on file  Social Connections: Not on file  Intimate Partner Violence: Not on file   Family History  Problem Relation Age of Onset   Cancer Mother    Cancer Father     Objective: Office vital signs reviewed. BP 139/88   Pulse 64   Temp 98.1 F (36.7 C)   Ht _0  (1.651 m)   Wt 214 lb 6.4 oz (97.3 kg)   SpO2 98%   BMI 35.68 kg/m   Physical Examination:  General: Awake, alert, obese, No acute distress HEENT: Normal; sclera white Cardio: regular rate and rhythm, S1S2 heard, no murmurs appreciated Pulm: clear to auscultation bilaterally, no wheezes, rhonchi or rales; normal work of breathing on room air Extremities: warm, well perfused, No edema, cyanosis or clubbing; +2 pulses bilaterally  Diabetic Foot Exam - Simple   Simple Foot Form Diabetic Foot exam was performed with the following findings: Yes 09/24/2020  9:03 AM  Visual Inspection No deformities, no ulcerations, no other skin breakdown bilaterally: Yes Sensation Testing See comments: Yes Pulse Check Posterior Tibialis and Dorsalis pulse intact bilaterally: Yes Comments Decreased monofilament sensation more so on the left compared to the right foot.  However, monofilament sensation is present.    Assessment/ Plan: 65y.o. female   Type 2 diabetes mellitus with other specified complication, without long-term current use of insulin (HCC) - Plan: Bayer DCA Hb A1c Waived  Hyperlipidemia associated with type 2 diabetes mellitus  (HCC)  Hypertension associated with diabetes ([place]  Nonalcoholic fatty liver disease without nonalcoholic steatohepatitis ([facility]) - Plan: Hepatic Function Panel, CBC, IGG, CANCELED: IGG, CANCELED: Hepatic Function Panel, CANCELED: CBC  Autoimmune hepatitis ([place] - Plan: Hepatic Function Panel, CBC, IGG, CANCELED: IGG, CANCELED: Hepatic Function Panel, CANCELED: CBC  Thyroid nodule - Plan: [place]RO[place]  Numbness and tingling of both feet - Plan: Vitamin B12  Morbid obesity ([place]  BMI 35.0-35.9,adult  Sugars not controlled.  A1c has now gone up very sharply to 8.5.  I suspect that she needs insulin therapy at this point and I discussed this with patient.  Would consider long-acting.  She is hesitant due to history of hypoglycemia in the past with other medicines.  I be fine with her starting at 5 units nightly and then advancing  by 1 unit every 2 days for fasting blood sugars above 150.  She will see Almyra Free to discuss this further and for demonstration of injection today.  Samples to be provided  Not currently treated with any cholesterol-lowering medication due to elevated LFTs with fenofibrate and intolerance to statins.  Will CC her hepatitis labs to her specialist.  She is due for repeat thyroid ultrasound given history of thyroid nodule.  This has been ordered  With regards to her numbness and tingling in both feet we will check a B12.  She may have combination of idiopathic and diabetic neuropathy.  We discussed consideration for alpha lipoic acid 600 mg daily for diabetic neuropathy.  It appears to have indication in liver disease in fact help, particularly nonalcoholic fatty liver disease.  Medication has been sent.  Will CC her hepatologist as an [place]  As always I encourage her to increase physical activity and limit carbohydrate intake in efforts to improve metabolic profile and body habitus.  Orders Placed This Encounter  Procedures   Bayer [place] Hb A1c Waived   Hepatic Function  Panel    Order Specific Question:   CC Results    AnswerRayvon Char [[id number]]   CBC    Order Specific Question:   CC Results    Answer:   Rayvon Char [[id number]]   IGG    Order Specific Question:   CC Results    Answer:   Rayvon Char [[id number]]   No orders of the defined types were placed in this encounter.    Taylor Norlander, DO [place] [phone]

## 2020-09-25 LAB — HEPATIC FUNCTION PANEL
ALT: 56 IU/L — ABNORMAL HIGH (ref 0–32)
AST: 34 IU/L (ref 0–40)
Albumin: 4.2 g/dL (ref 3.8–4.8)
Alkaline Phosphatase: 73 IU/L (ref 44–121)
Bilirubin Total: 0.4 mg/dL (ref 0.0–1.2)
Bilirubin, Direct: 0.12 mg/dL (ref 0.00–0.40)
Total Protein: 7.3 g/dL (ref 6.0–8.5)

## 2020-09-25 LAB — CBC
Hematocrit: 39.8 % (ref 34.0–46.6)
Hemoglobin: 13.6 g/dL (ref 11.1–15.9)
MCH: 31.3 pg (ref 26.6–33.0)
MCHC: 34.2 g/dL (ref 31.5–35.7)
MCV: 92 fL (ref 79–97)
Platelets: 138 10*3/uL — ABNORMAL LOW (ref 150–450)
RBC: 4.35 x10E6/uL (ref 3.77–5.28)
RDW: 12 % (ref 11.7–15.4)
WBC: 5 10*3/uL (ref 3.4–10.8)

## 2020-09-25 LAB — IGG: IgG (Immunoglobin G), Serum: 1558 mg/dL (ref 586–1602)

## 2020-09-25 LAB — VITAMIN B12: Vitamin B-12: 1529 pg/mL — ABNORMAL HIGH (ref 232–1245)

## 2020-10-02 ENCOUNTER — Ambulatory Visit (HOSPITAL_COMMUNITY)

## 2020-10-09 ENCOUNTER — Other Ambulatory Visit: Payer: Self-pay | Admitting: Family Medicine

## 2020-10-09 DIAGNOSIS — K219 Gastro-esophageal reflux disease without esophagitis: Secondary | ICD-10-CM

## 2020-10-09 MED ORDER — PANTOPRAZOLE SODIUM 40 MG PO TBEC: 40.0000 mg | DELAYED_RELEASE_TABLET | Freq: Every day | ORAL | 5 refills | Status: DC

## 2020-10-13 ENCOUNTER — Other Ambulatory Visit: Payer: Self-pay | Admitting: Family Medicine

## 2020-10-13 ENCOUNTER — Encounter: Payer: Self-pay | Admitting: Family Medicine

## 2020-10-13 DIAGNOSIS — E1169 Type 2 diabetes mellitus with other specified complication: Secondary | ICD-10-CM

## 2020-10-13 MED ORDER — SAXAGLIPTIN HCL 5 MG PO TABS: 5.0000 mg | ORAL_TABLET | Freq: Every day | ORAL | 3 refills | Status: DC

## 2020-10-15 ENCOUNTER — Telehealth: Payer: Self-pay

## 2020-10-15 NOTE — Telephone Encounter (Signed)
Natalin Rasnick KeyPaulla Dolly - Rx #: [id number]

## 2020-10-15 NOTE — Telephone Encounter (Signed)
Jaselle Shiffman Key: [id number] - Rx #: [id number] 

## 2020-10-23 NOTE — Telephone Encounter (Signed)
Denied on July 18

## 2020-10-26 ENCOUNTER — Other Ambulatory Visit: Payer: Self-pay

## 2020-10-26 ENCOUNTER — Other Ambulatory Visit: Payer: Self-pay | Admitting: Family Medicine

## 2020-10-26 ENCOUNTER — Ambulatory Visit (HOSPITAL_COMMUNITY)
Admission: RE | Admit: 2020-10-26 | Discharge: 2020-10-26 | Disposition: A | Discharge status: Home / Self Care | Payer: BC Managed Care – PPO | Source: Ambulatory Visit | Attending: Family Medicine | Admitting: Family Medicine

## 2020-10-26 DIAGNOSIS — E041 Nontoxic single thyroid nodule: Secondary | ICD-10-CM | POA: Insufficient documentation

## 2020-10-26 DIAGNOSIS — E1169 Type 2 diabetes mellitus with other specified complication: Secondary | ICD-10-CM

## 2020-10-26 MED ORDER — LINAGLIPTIN 5 MG PO TABS: 5.0000 mg | ORAL_TABLET | Freq: Every day | ORAL | 3 refills | Status: DC

## 2020-10-26 NOTE — Progress Notes (Signed)
Switched her to [place].  Was reluctant to go back to Januvia.

## 2020-10-26 NOTE — Telephone Encounter (Signed)
Yes, please transition her to whatever her insurance will cover if she is willing.

## 2020-10-27 ENCOUNTER — Telehealth: Payer: Self-pay | Admitting: *Deleted

## 2020-10-27 DIAGNOSIS — E1169 Type 2 diabetes mellitus with other specified complication: Secondary | ICD-10-CM

## 2020-10-27 NOTE — Telephone Encounter (Signed)
Tradjenta 5 mg tab PA started   key: [id number]   Patient name Taylor Leach Date of birth 1955-11-02 Medication Tradjenta 5MG  tablets Rejection code 43 PA REQ, USE JANUVIA OR 61:  Attempting due to pt not liking the [id number]  Awaiting question response

## 2020-10-27 NOTE — Telephone Encounter (Signed)
Denied today 

## 2020-10-27 NOTE — Telephone Encounter (Signed)
Help plesae

## 2020-10-28 NOTE — Telephone Encounter (Signed)
Tradjenta approved---please let patient and pharmacy know Resubmitted PA--patient did try and fail farxiga and januvia--now approved

## 2020-11-19 DIAGNOSIS — L570 Actinic keratosis: Secondary | ICD-10-CM | POA: Diagnosis not present

## 2020-11-19 DIAGNOSIS — B354 Tinea corporis: Secondary | ICD-10-CM | POA: Diagnosis not present

## 2020-11-19 DIAGNOSIS — L820 Inflamed seborrheic keratosis: Secondary | ICD-10-CM | POA: Diagnosis not present

## 2020-11-19 DIAGNOSIS — B078 Other viral warts: Secondary | ICD-10-CM | POA: Diagnosis not present

## 2020-12-25 ENCOUNTER — Other Ambulatory Visit: Payer: Self-pay

## 2020-12-25 ENCOUNTER — Other Ambulatory Visit: Payer: BC Managed Care – PPO

## 2020-12-25 ENCOUNTER — Ambulatory Visit: Payer: BC Managed Care – PPO | Admitting: Family Medicine

## 2020-12-25 DIAGNOSIS — K754 Autoimmune hepatitis: Secondary | ICD-10-CM | POA: Diagnosis not present

## 2020-12-25 DIAGNOSIS — E1169 Type 2 diabetes mellitus with other specified complication: Secondary | ICD-10-CM | POA: Diagnosis not present

## 2020-12-25 LAB — BAYER DCA HB A1C WAIVED: HB A1C (BAYER DCA - WAIVED): 6.5 % — ABNORMAL HIGH (ref 4.8–5.6)

## 2020-12-26 LAB — IGG: IgG (Immunoglobin G), Serum: 1607 mg/dL — ABNORMAL HIGH (ref 586–1602)

## 2020-12-26 LAB — CBC
Hematocrit: 43 % (ref 34.0–46.6)
Hemoglobin: 14.3 g/dL (ref 11.1–15.9)
MCH: 31.2 pg (ref 26.6–33.0)
MCHC: 33.3 g/dL (ref 31.5–35.7)
MCV: 94 fL (ref 79–97)
Platelets: 145 10*3/uL — ABNORMAL LOW (ref 150–450)
RBC: 4.59 x10E6/uL (ref 3.77–5.28)
RDW: 12.9 % (ref 11.7–15.4)
WBC: 4.6 10*3/uL (ref 3.4–10.8)

## 2020-12-26 LAB — HEPATIC FUNCTION PANEL
ALT: 37 IU/L — ABNORMAL HIGH (ref 0–32)
AST: 30 IU/L (ref 0–40)
Albumin: 4.1 g/dL (ref 3.8–4.8)
Alkaline Phosphatase: 57 IU/L (ref 44–121)
Bilirubin Total: 0.5 mg/dL (ref 0.0–1.2)
Bilirubin, Direct: 0.14 mg/dL (ref 0.00–0.40)
Total Protein: 7.3 g/dL (ref 6.0–8.5)

## 2020-12-28 ENCOUNTER — Other Ambulatory Visit: Payer: Self-pay

## 2020-12-28 ENCOUNTER — Encounter: Payer: Self-pay | Admitting: Family Medicine

## 2020-12-28 ENCOUNTER — Ambulatory Visit (INDEPENDENT_AMBULATORY_CARE_PROVIDER_SITE_OTHER): Payer: BC Managed Care – PPO | Admitting: Family Medicine

## 2020-12-28 VITALS — BP 138/66 | HR 68 | Temp 98.1°F | Ht 65.0 in | Wt 205.0 lb

## 2020-12-28 DIAGNOSIS — E785 Hyperlipidemia, unspecified: Secondary | ICD-10-CM

## 2020-12-28 DIAGNOSIS — K754 Autoimmune hepatitis: Secondary | ICD-10-CM | POA: Diagnosis not present

## 2020-12-28 DIAGNOSIS — E1169 Type 2 diabetes mellitus with other specified complication: Secondary | ICD-10-CM | POA: Diagnosis not present

## 2020-12-28 DIAGNOSIS — E1159 Type 2 diabetes mellitus with other circulatory complications: Secondary | ICD-10-CM | POA: Diagnosis not present

## 2020-12-28 DIAGNOSIS — I152 Hypertension secondary to endocrine disorders: Secondary | ICD-10-CM | POA: Diagnosis not present

## 2020-12-28 NOTE — Progress Notes (Signed)
Subjective: CC:DM PCP: Taylor Norlander, DO AFB:XUXY Taylor Leach is a 65 y.o. female presenting to clinic today for:  1. Type 2 Diabetes with hypertension, hyperlipidemia:  Tradjenta 5 mg was added at last visit due to uncontrolled sugar.  She notes that she really did not take due to side effects.  She had a moment of clarity when she went out to eat and her blood sugar was over 300s.  She decided to try going back on keto and is in a keto support group that gives various recipes.  She has been doing this since August and seems to be doing really well at sticking to it.  She overall feels better.  Blood sugars have been well controlled with this morning's blood sugar at 89.  Last eye exam: UTD Last foot exam: UTD Last A1c:  Lab Results  Component Value Date   HGBA1C 6.5 (H) 12/25/2020   Nephropathy screen indicated?: UTD Last flu, zoster and/or pneumovax:  Immunization History  Administered Date(s) Administered   Hep A / Hep B 03/24/2008, 04/23/2008, 09/08/2008   Influenza, Seasonal, Injecte, Preservative Fre 01/10/2008   Influenza,inj,Quad PF,6+ Mos 05/18/2016   Influenza-Unspecified 02/02/2018   PFIZER(Purple Top)SARS-COV-2 Vaccination 11/08/2019, 11/29/2019   Pneumococcal Polysaccharide-23 11/30/2017   Zoster Recombinat (Shingrix) 12/12/2018, 06/12/2019    ROS: No chest pain, shortness of breath.  Her right side pain symptoms gotten better since transition over to new diet.  Her husband is also doing better with this diet  2.  Autoimmune hepatitis Has appointment with her specialist in October.  Had labs done.  Small rise in IgG but other levels seem to be getting better.  She does report some easy bruising.  Platelets were 140 5K.  She admits to taking ibuprofen about 3 times per week but does not ever take more than 600 mg and never in excess.  She is compliant with Myfortic and Entocort.  ROS: Per HPI  Allergies  Allergen Reactions   Fenofibrate Micronized Other (See  Comments)    Caused increased LFT's Caused increased LFT's   Glipizide     Hypoglycemia    Codeine    [place] [Dapagliflozin]     Bladder pains   Hydrocodone Nausea And Vomiting and Other (See Comments)    Makes body hot.    Hydrocodone-Acetaminophen Nausea And Vomiting   Janumet [Sitagliptin-Metformin Hcl]     Extremely bad gas    Semaglutide     GI side effects   Past Medical History:  Diagnosis Date   Arthritis    Autoimmune hepatitis ([place])    Chest pain    Stress echo normal, October, 2012   Dyslipidemia    Ejection fraction    EF 55-60%, echo, October, 2012   GERD (gastroesophageal reflux disease)     Current Outpatient Medications:    Alpha-Lipoic Acid 600 MG CAPS, Take 1 capsule (600 mg total) by mouth daily. For neuropathy, Disp: 90 capsule, Rfl: 3   blood glucose meter kit and supplies KIT, Dispense based on patient and insurance preference. Use up to four times daily as directed. (FOR ICD-9 250.00, 250.01). E11.9, Disp: 1 each, Rfl: 0   budesonide (ENTOCORT EC) 3 MG 24 hr capsule, Take 3 mg by mouth 2 (two) times daily., Disp: , Rfl:    cetirizine (ZYRTEC) 10 MG tablet, Take by mouth., Disp: , Rfl:    CONTOUR NEXT TEST test strip, CHECK BLOOD SUGAR UP TO 4 TIMES A DAY OR AS DIRECTED, Disp: 400 each,  Rfl: 2   fluorouracil (EFUDEX) 5 % cream, Apply topically 2 (two) times daily., Disp: , Rfl:    fluticasone (FLONASE) 50 MCG/ACT nasal spray, Place 2 sprays into both nostrils daily. Sinus Allergies, Disp: 16 g, Rfl: 11   linagliptin (TRADJENTA) 5 MG TABS tablet, Take 1 tablet (5 mg total) by mouth daily. To replace Onglyza, Disp: 90 tablet, Rfl: 3   mycophenolate (MYFORTIC) 360 MG TBEC EC tablet, Take 360 mg by mouth 2 (two) times daily., Disp: , Rfl:    pantoprazole (PROTONIX) 40 MG tablet, Take 1 tablet (40 mg total) by mouth daily., Disp: 30 tablet, Rfl: 5 Social History   Socioeconomic History   Marital status: Married    Spouse name: Not on file   Number of  children: Not on file   Years of education: Not on file   Highest education level: Not on file  Occupational History   Not on file  Tobacco Use   Smoking status: Former    Packs/day: 1.00    Types: Cigarettes   Smokeless tobacco: Never  Vaping Use   Vaping Use: Never used  Substance and Sexual Activity   Alcohol use: Yes    Alcohol/week: 1.0 standard drink    Types: 1 Glasses of wine per week   Drug use: No   Sexual activity: Yes    Birth control/protection: Surgical  Other Topics Concern   Not on file  Social History Narrative   Not on file   Social Determinants of Health   Financial Resource Strain: Not on file  Food Insecurity: Not on file  Transportation Needs: Not on file  Physical Activity: Not on file  Stress: Not on file  Social Connections: Not on file  Intimate Partner Violence: Not on file   Family History  Problem Relation Age of Onset   Cancer Mother    Cancer Father     Objective: Office vital signs reviewed. BP 138/66   Pulse 68   Temp 98.1 F (36.7 C)   Ht '5\' 5"'  (1.651 m)   Wt 205 lb (93 kg)   SpO2 96%   BMI 34.11 kg/m   Physical Examination:  General: Awake, alert, well nourished, No acute distress HEENT: Normal, sclera white Cardio: regular rate and rhythm, S1S2 heard, no murmurs appreciated Pulm: clear to auscultation bilaterally, no wheezes, rhonchi or rales; normal work of breathing on room air Extremities: warm, well perfused, No edema, cyanosis or clubbing; +2 pulses bilaterally Skin: Actinic keratoses noted along the ulnar aspects of the hand, right.  She also has healing ecchymosis noted along the forearms bilaterally  Assessment/ Plan: 65 y.o. female   Controlled type 2 diabetes mellitus with other specified complication, without long-term current use of insulin ([place]) - Plan: Bayer DCA Hb A1c Waived  Hypertension associated with diabetes ([place])  Hyperlipidemia associated with type 2 diabetes mellitus ([place])  Autoimmune  hepatitis ([place]) - Plan: IGG, Hepatic Function Panel, CBC  Sugar under excellent control with lifestyle modification.  A1c down to 6.5.  I congratulated her.  We can follow-up in 4 months but future labs have been placed to be collected at her convenience  Blood pressure under good control.  Diet-controlled  Liver enzymes seem to be improving.  Not due for fasting labs  No orders of the defined types were placed in this encounter.  No orders of the defined types were placed in this encounter.    Taylor Norlander, DO [place] [phone]

## 2021-02-17 NOTE — Progress Notes (Signed)
Cardiology Office Note  Date: 02/18/2021   ID: [name], [name] Dec 19, 1955, MRN [id number]  PCP:  Taylor Norlander, DO  Cardiologist:  Taylor Dolly, MD Electrophysiologist:  None   Chief Complaint: 1 year follow-up  History of Present Illness: Taylor Leach is a 65 y.o. female with a history of HTN, GERD, NASH, HLD, DM2.  She was last seen by Taylor Leach on 09/09/2019 via telemedicine video visit.  She had some recent symptoms of feeling her heart racing and some shortness of breath at rest or with activity occurring daily lasting approximately 1 minute each episode.  She had cut back on caffeinated beverages.  Previous monitor in May 2021 showed sinus rhythm with PVCs.  She had mild stable symptoms since last visit.  Baseline EKG showed normal sinus rhythm.  Event monitor with occasional PVCs.  No NSVT.  No indication for additional cardiac testing.  Discussed beta-blocker or CCB.  She reports symptoms were tolerable and wanted to hold off on any additional medications.  She is here today for 1 year follow-up.  She denies any significant issues since last visit.  Her blood pressure is elevated today but she states it is usually normal at home.  She denies any current issues with palpitations.  States palpitations are sporadic and not very frequent.  Her EKG today shows normal sinus rhythm with a rate of 65, minimal voltage criteria for LVH.  May be normal variant.  Nonspecific T wave abnormality.  She denies any anginal or exertional symptoms, orthostatic symptoms, CVA or TIA-like symptoms, DOE or SOB, PND, orthopnea, bleeding, claudication, DVT or PE-like symptoms, or lower extremity edema.  She states blood sugar is reasonably controlled.  She states she is on chronic steroids for her autoimmune disease.  She saw her primary care provider in September for follow-up on her diabetes, hypertension, hyperlipidemia, autoimmune hepatitis.  She was compliant with her Myfortic and Entocort.  Her  A1c was down to 6.5% from 8.5%.   Past Medical History:  Diagnosis Date   Arthritis    Autoimmune hepatitis ([place])    Chest pain    Stress echo normal, October, 2012   Dyslipidemia    Ejection fraction    EF 55-60%, echo, October, 2012   GERD (gastroesophageal reflux disease)     Past Surgical History:  Procedure Laterality Date   CHOLECYSTECTOMY     NASAL SINUS SURGERY     VAGINAL HYSTERECTOMY      Current Outpatient Medications  Medication Sig Dispense Refill   Alpha-Lipoic Acid 600 MG CAPS Take 1 capsule (600 mg total) by mouth daily. For neuropathy 90 capsule 3   blood glucose meter kit and supplies KIT Dispense based on patient and insurance preference. Use up to four times daily as directed. (FOR ICD-9 250.00, 250.01). E11.9 1 each 0   budesonide (ENTOCORT EC) 3 MG 24 hr capsule Take 3 mg by mouth 2 (two) times daily.     cetirizine (ZYRTEC) 10 MG tablet Take by mouth.     CONTOUR NEXT TEST test strip CHECK BLOOD SUGAR UP TO 4 TIMES A DAY OR AS DIRECTED 400 each 2   fluorouracil (EFUDEX) 5 % cream Apply topically 2 (two) times daily.     mycophenolate (MYFORTIC) 360 MG TBEC EC tablet Take 360 mg by mouth 2 (two) times daily.     pantoprazole (PROTONIX) 40 MG tablet Take 1 tablet (40 mg total) by mouth daily. 30 tablet 5   No current  facility-administered medications for this visit.   Allergies:  Fenofibrate micronized, Glipizide, Codeine, Farxiga [dapagliflozin], Hydrocodone, Hydrocodone-acetaminophen, Janumet [sitagliptin-metformin hcl], and Semaglutide   Social History: The patient  reports that she has quit smoking. Her smoking use included cigarettes. She smoked an average of 1 pack per day. She has never used smokeless tobacco. She reports current alcohol use of about 1.0 standard drink per week. She reports that she does not use drugs.   Family History: The patient's family history includes Arthritis/Rheumatoid in her daughter; Cancer in her father and mother;  Diabetes type II in her daughter; Lupus in her daughter.   ROS:  Please see the history of present illness. Otherwise, complete review of systems is positive for none.  All other systems are reviewed and negative.   Physical Exam: VS:  BP (!) 144/80   Pulse 68   Ht _0  (1.651 m)   Wt 209 lb (94.8 kg)   SpO2 98%   BMI 34.78 kg/m , BMI Body mass index is 34.78 kg/m.  Wt Readings from Last 3 Encounters:  02/18/21 209 lb (94.8 kg)  12/28/20 205 lb (93 kg)  09/24/20 214 lb 6.4 oz (97.3 kg)    General: Patient appears comfortable at rest. Neck: Supple, no elevated JVP or carotid bruits, no thyromegaly. Lungs: Clear to auscultation, nonlabored breathing at rest. Cardiac: Regular rate and rhythm, no S3 or significant systolic murmur, no pericardial rub. Extremities: No pitting edema, distal pulses 2+. Skin: Warm and dry. Musculoskeletal: No kyphosis. Neuropsychiatric: Alert and oriented x3, affect grossly appropriate.  ECG: 02/18/2021 normal sinus rhythm rate of 65, minimal voltage criteria for LVH.  May be normal variant, nonspecific T wave abnormality.  Recent Labwork: 03/20/2020: BUN 11; Creatinine, Ser 0.91; Potassium 3.9; Sodium 140 12/25/2020: ALT 37; AST 30; Hemoglobin 14.3; Platelets 145     Component Value Date/Time   CHOL 213 (H) 03/20/2020 0841   TRIG 163 (H) 03/20/2020 0841   HDL 55 03/20/2020 0841   CHOLHDL 3.9 03/20/2020 0841   CHOLHDL 4.1 10/23/2010 1748   VLDL 40 10/23/2010 1748   LDLCALC 129 (H) 03/20/2020 0841    Other Studies Reviewed Today:   Assessment and Plan:  1. Palpitations   2. Type 2 diabetes mellitus without complication, with long-term current use of insulin (HCC)    1. Palpitations Has an occasional sporadic palpitation but nothing bothersome per her statement.  EKG today shows normal sinus rhythm rate of 65, minimal voltage criteria for LVH, may be normal variant, nonspecific T wave abnormality.   2.  Diabetes Blood sugar has improved  significantly.  Recent hemoglobin A1c was 6.5% on 12/25/2020 down from previous of 8.5% on 09/24/2020.   Medication Adjustments/Labs and Tests Ordered: Current medicines are reviewed at length with the patient today.  Concerns regarding medicines are outlined above.   Disposition: Follow-up with Taylor Leach or APP 1 year  Signed, Levell July, NP 02/18/2021 4:42 PM    [facility], [place], [place] [address] Phone: [phone]; Fax: [phone]

## 2021-02-18 ENCOUNTER — Encounter: Payer: Self-pay | Admitting: Family Medicine

## 2021-02-18 ENCOUNTER — Ambulatory Visit (INDEPENDENT_AMBULATORY_CARE_PROVIDER_SITE_OTHER): Payer: BC Managed Care – PPO | Admitting: Family Medicine

## 2021-02-18 VITALS — BP 144/80 | HR 68 | Ht 65.0 in | Wt 209.0 lb

## 2021-02-18 DIAGNOSIS — R002 Palpitations: Secondary | ICD-10-CM

## 2021-02-18 DIAGNOSIS — Z794 Long term (current) use of insulin: Secondary | ICD-10-CM

## 2021-02-18 DIAGNOSIS — E782 Mixed hyperlipidemia: Secondary | ICD-10-CM

## 2021-02-18 DIAGNOSIS — E119 Type 2 diabetes mellitus without complications: Secondary | ICD-10-CM

## 2021-02-18 NOTE — Patient Instructions (Addendum)
Medication Instructions:  Your physician recommends that you continue on your current medications as directed. Please refer to the Current Medication list given to you today.  Labwork: none  Testing/Procedures: none  Follow-Up: Your physician recommends that you schedule a follow-up appointment in: 1 year. You will receive a reminder all or letter in the mail in about 10 months reminding you to call and schedule your appointment. If you don't receive this letter, please contact our office.  Any Other Special Instructions Will Be Listed Below (If Applicable).  If you need a refill on your cardiac medications before your next appointment, please call your pharmacy.

## 2021-03-04 DIAGNOSIS — U071 COVID-19: Secondary | ICD-10-CM

## 2021-03-04 HISTORY — DX: COVID-19: U07.1

## 2021-03-11 ENCOUNTER — Ambulatory Visit (INDEPENDENT_AMBULATORY_CARE_PROVIDER_SITE_OTHER): Payer: BC Managed Care – PPO | Admitting: Family Medicine

## 2021-03-11 ENCOUNTER — Encounter: Payer: Self-pay | Admitting: Family Medicine

## 2021-03-11 DIAGNOSIS — J0100 Acute maxillary sinusitis, unspecified: Secondary | ICD-10-CM | POA: Diagnosis not present

## 2021-03-11 MED ORDER — AMOXICILLIN 500 MG PO CAPS: 500.0000 mg | ORAL_CAPSULE | Freq: Two times a day (BID) | ORAL | 0 refills | Status: DC

## 2021-03-11 MED ORDER — FLUTICASONE PROPIONATE 50 MCG/ACT NA SUSP: 1.0000 | Freq: Two times a day (BID) | NASAL | 6 refills | Status: DC | PRN

## 2021-03-11 NOTE — Progress Notes (Signed)
Virtual Visit via telephone Note  I connected with Taylor Leach on 03/11/21 at 1609 by telephone and verified that I am speaking with the correct person using two identifiers. Taylor Leach is currently located at home and patient are currently with her during visit. The provider, Fransisca Kaufmann Adysen Raphael, MD is located in their office at time of visit.  Call ended at 1615  I discussed the limitations, risks, security and privacy concerns of performing an evaluation and management service by telephone and the availability of in person appointments. I also discussed with the patient that there may be a patient responsible charge related to this service. The patient expressed understanding and agreed to proceed.   History and Present Illness: Patient is calling in for sinus issues and nasal green drainage.  She has been taking cold and sinus meds. She has this for 6 days. She denies sick contacts. She denies SOB or wheezing.  She had nasal polyps. She did not have flu shot, is covid vaccine. She has a lot of sinus pressure in head and face  1. Acute maxillary sinusitis, recurrence not specified     Outpatient Encounter Medications as of 03/11/2021  Medication Sig   amoxicillin (AMOXIL) 500 MG capsule Take 1 capsule (500 mg total) by mouth 2 (two) times daily.   fluticasone (FLONASE) 50 MCG/ACT nasal spray Place 1 spray into both nostrils 2 (two) times daily as needed for allergies or rhinitis.   Alpha-Lipoic Acid 600 MG CAPS Take 1 capsule (600 mg total) by mouth daily. For neuropathy   blood glucose meter kit and supplies KIT Dispense based on patient and insurance preference. Use up to four times daily as directed. (FOR ICD-9 250.00, 250.01). E11.9   budesonide (ENTOCORT EC) 3 MG 24 hr capsule Take 3 mg by mouth 2 (two) times daily.   cetirizine (ZYRTEC) 10 MG tablet Take by mouth.   CONTOUR NEXT TEST test strip CHECK BLOOD SUGAR UP TO 4 TIMES A DAY OR AS DIRECTED   fluorouracil (EFUDEX) 5 % cream  Apply topically 2 (two) times daily.   mycophenolate (MYFORTIC) 360 MG TBEC EC tablet Take 360 mg by mouth 2 (two) times daily.   pantoprazole (PROTONIX) 40 MG tablet Take 1 tablet (40 mg total) by mouth daily.   No facility-administered encounter medications on file as of 03/11/2021.    Review of Systems  Constitutional:  Negative for chills and fever.  HENT:  Positive for congestion, postnasal drip, rhinorrhea, sinus pressure, sinus pain and sore throat. Negative for ear discharge, ear pain and sneezing.   Eyes:  Negative for visual disturbance.  Respiratory:  Negative for chest tightness and shortness of breath.   Cardiovascular:  Negative for chest pain and leg swelling.  Musculoskeletal:  Negative for back pain and gait problem.  Skin:  Negative for rash.  Neurological:  Positive for headaches. Negative for light-headedness.  Psychiatric/Behavioral:  Negative for agitation and behavioral problems.   All other systems reviewed and are negative.  Observations/Objective: Patient sounds comfortable and in no acute distress  Assessment and Plan: Problem List Items Addressed This Visit   None Visit Diagnoses     Acute maxillary sinusitis, recurrence not specified    -  Primary   Relevant Medications   fluticasone (FLONASE) 50 MCG/ACT nasal spray   amoxicillin (AMOXIL) 500 MG capsule       Patient has been dealing with sinus tach for 5 days and is not improving and today is day 6  and is worsening and over-the-counter treatments are not helping.  Sent amoxicillin for her. Follow up plan: Return if symptoms worsen or fail to improve.     I discussed the assessment and treatment plan with the patient. The patient was provided an opportunity to ask questions and all were answered. The patient agreed with the plan and demonstrated an understanding of the instructions.   The patient was advised to call back or seek an in-person evaluation if the symptoms worsen or if the condition  fails to improve as anticipated.  The above assessment and management plan was discussed with the patient. The patient verbalized understanding of and has agreed to the management plan. Patient is aware to call the clinic if symptoms persist or worsen. Patient is aware when to return to the clinic for a follow-up visit. Patient educated on when it is appropriate to go to the emergency department.    I provided 6 minutes of non-face-to-face time during this encounter.    Worthy Rancher, MD

## 2021-03-19 ENCOUNTER — Encounter: Payer: Self-pay | Admitting: Family Medicine

## 2021-03-19 ENCOUNTER — Ambulatory Visit (INDEPENDENT_AMBULATORY_CARE_PROVIDER_SITE_OTHER): Payer: BC Managed Care – PPO | Admitting: Family Medicine

## 2021-03-19 ENCOUNTER — Telehealth: Payer: Self-pay | Admitting: Family Medicine

## 2021-03-19 DIAGNOSIS — R6889 Other general symptoms and signs: Secondary | ICD-10-CM | POA: Diagnosis not present

## 2021-03-19 DIAGNOSIS — U071 COVID-19: Secondary | ICD-10-CM

## 2021-03-19 LAB — VERITOR FLU A/B WAIVED
Influenza A: NEGATIVE
Influenza B: NEGATIVE

## 2021-03-19 MED ORDER — MOLNUPIRAVIR EUA 200MG CAPSULE: 4.0000 | ORAL_CAPSULE | Freq: Two times a day (BID) | ORAL | 0 refills | Status: AC

## 2021-03-19 NOTE — Telephone Encounter (Signed)
Pt aware meds sent

## 2021-03-19 NOTE — Addendum Note (Signed)
Addended by: Sonny Masters on: 03/19/2021 03:41 PM   Modules accepted: Orders

## 2021-03-19 NOTE — Progress Notes (Addendum)
Virtual Visit via telephone note Due to COVID-19 pandemic this visit was conducted virtually. This visit type was conducted due to national recommendations for restrictions regarding the COVID-19 Pandemic (e.g. social distancing, sheltering in place) in an effort to limit this patient's exposure and mitigate transmission in our community. All issues noted in this document were discussed and addressed.  A physical exam was not performed with this format.   I connected with Taylor Leach on 03/19/2021 at 1023 by telephone and verified that I am speaking with the correct person using two identifiers. Taylor Leach is currently located at home and patient is currently with them during visit. The provider, Monia Pouch, FNP is located in their office at time of visit.  I discussed the limitations, risks, security and privacy concerns of performing an evaluation and management service by virtual visit and the availability of in person appointments. I also discussed with the patient that there may be a patient responsible charge related to this service. The patient expressed understanding and agreed to proceed.  Subjective:  Patient ID: Taylor Leach, female    DOB: 10-Jun-1955, 65 y.o.   MRN: [id number]  Chief Complaint:  Influenza   HPI: Taylor Leach is a 65 y.o. female presenting on 03/19/2021 for Influenza   Patient reports fever, chills, myalgias, and headache.  Onset last night.  Worse this morning.  She has been taking ibuprofen without complete relief of symptoms.  She denies any known sick exposures but has been around people.  Influenza This is a new problem. The current episode started yesterday. The problem occurs constantly. The problem has been gradually worsening. Associated symptoms include arthralgias, chills, congestion, coughing, fatigue, a fever, headaches and myalgias. Pertinent negatives include no abdominal pain, anorexia, change in bowel habit, chest pain, diaphoresis, joint  swelling, nausea, neck pain, numbness, rash, sore throat, swollen glands, urinary symptoms, vertigo, visual change, vomiting or weakness. She has tried NSAIDs for the symptoms. The treatment provided no relief.    Relevant past medical, surgical, family, and social history reviewed and updated as indicated.  Allergies and medications reviewed and updated.   Past Medical History:  Diagnosis Date   Arthritis    Autoimmune hepatitis ([place])    Chest pain    Stress echo normal, October, 2012   Dyslipidemia    Ejection fraction    EF 55-60%, echo, October, 2012   GERD (gastroesophageal reflux disease)     Past Surgical History:  Procedure Laterality Date   CHOLECYSTECTOMY     NASAL SINUS SURGERY     VAGINAL HYSTERECTOMY      Social History   Socioeconomic History   Marital status: Married    Spouse name: Not on file   Number of children: Not on file   Years of education: Not on file   Highest education level: Not on file  Occupational History   Not on file  Tobacco Use   Smoking status: Former    Packs/day: 1.00    Types: Cigarettes   Smokeless tobacco: Never  Vaping Use   Vaping Use: Never used  Substance and Sexual Activity   Alcohol use: Yes    Alcohol/week: 1.0 standard drink    Types: 1 Glasses of wine per week   Drug use: No   Sexual activity: Yes    Birth control/protection: Surgical  Other Topics Concern   Not on file  Social History Narrative   Not on file   Social Determinants of Health  Financial Resource Strain: Not on file  Food Insecurity: Not on file  Transportation Needs: Not on file  Physical Activity: Not on file  Stress: Not on file  Social Connections: Not on file  Intimate Partner Violence: Not on file    Outpatient Encounter Medications as of 03/19/2021  Medication Sig   Alpha-Lipoic Acid 600 MG CAPS Take 1 capsule (600 mg total) by mouth daily. For neuropathy   amoxicillin (AMOXIL) 500 MG capsule Take 1 capsule (500 mg total) by  mouth 2 (two) times daily.   blood glucose meter kit and supplies KIT Dispense based on patient and insurance preference. Use up to four times daily as directed. (FOR ICD-9 250.00, 250.01). E11.9   budesonide (ENTOCORT EC) 3 MG 24 hr capsule Take 3 mg by mouth 2 (two) times daily.   cetirizine (ZYRTEC) 10 MG tablet Take by mouth.   CONTOUR NEXT TEST test strip CHECK BLOOD SUGAR UP TO 4 TIMES A DAY OR AS DIRECTED   fluorouracil (EFUDEX) 5 % cream Apply topically 2 (two) times daily.   fluticasone (FLONASE) 50 MCG/ACT nasal spray Place 1 spray into both nostrils 2 (two) times daily as needed for allergies or rhinitis.   mycophenolate (MYFORTIC) 360 MG TBEC EC tablet Take 360 mg by mouth 2 (two) times daily.   pantoprazole (PROTONIX) 40 MG tablet Take 1 tablet (40 mg total) by mouth daily.   No facility-administered encounter medications on file as of 03/19/2021.    Allergies  Allergen Reactions   Fenofibrate Micronized Other (See Comments)    Caused increased LFT's Caused increased LFT's   Glipizide     Hypoglycemia    Codeine    [place] [Dapagliflozin]     Bladder pains   Hydrocodone Nausea And Vomiting and Other (See Comments)    Makes body hot.    Hydrocodone-Acetaminophen Nausea And Vomiting   Janumet [Sitagliptin-Metformin Hcl]     Extremely bad gas    Semaglutide     GI side effects    Review of Systems  Constitutional:  Positive for activity change, appetite change, chills, fatigue and fever. Negative for diaphoresis and unexpected weight change.  HENT:  Positive for congestion, postnasal drip and rhinorrhea. Negative for sinus pressure, sinus pain, sneezing, sore throat, tinnitus, trouble swallowing and voice change.   Respiratory:  Positive for cough. Negative for shortness of breath.   Cardiovascular:  Negative for chest pain.  Gastrointestinal:  Negative for abdominal pain, anorexia, change in bowel habit, nausea and vomiting.  Genitourinary:  Negative for decreased  urine volume.  Musculoskeletal:  Positive for arthralgias and myalgias. Negative for back pain, gait problem, joint swelling and neck pain.  Skin:  Negative for rash.  Neurological:  Positive for headaches. Negative for dizziness, vertigo, tremors, seizures, syncope, facial asymmetry, speech difficulty, weakness, light-headedness and numbness.  Psychiatric/Behavioral:  Negative for confusion.   All other systems reviewed and are negative.       Observations/Objective: No vital signs or physical exam, this was a virtual health encounter.  Pt alert and oriented, answers all questions appropriately, and able to speak in full sentences.    Assessment and Plan: [place] was seen today for influenza.  Diagnoses and all orders for this visit:  Flu-like symptoms Symptoms consistent with influenza, will test today.  If positive will initiate Tamiflu therapy.  Symptomatic care discussed with patient in detail.  Tylenol and Motrin as needed for fever and pain control.  Robitussin-DM or Delsym as needed for cough.  [employer]  of fluids and rest.  Patient aware to report any new, worsening, or persistent symptoms.  Follow-up as needed. -     Veritor Flu A/B Waived     Follow Up Instructions: Return if symptoms worsen or fail to improve.    I discussed the assessment and treatment plan with the patient. The patient was provided an opportunity to ask questions and all were answered. The patient agreed with the plan and demonstrated an understanding of the instructions.   The patient was advised to call back or seek an in-person evaluation if the symptoms worsen or if the condition fails to improve as anticipated.  The above assessment and management plan was discussed with the patient. The patient verbalized understanding of and has agreed to the management plan. Patient is aware to call the clinic if they develop any new symptoms or if symptoms persist or worsen. Patient is aware when to return to the  clinic for a follow-up visit. Patient educated on when it is appropriate to go to the emergency department.    I provided 13 minutes of time during this telephone encounter.   Monia Pouch, FNP-C [facility] [address] [place], [place] [address] [phone] 03/19/2021   1540: pt reports positive home COVID test, will send in antivirals.

## 2021-04-14 DIAGNOSIS — H02831 Dermatochalasis of right upper eyelid: Secondary | ICD-10-CM | POA: Diagnosis not present

## 2021-04-14 DIAGNOSIS — H52203 Unspecified astigmatism, bilateral: Secondary | ICD-10-CM | POA: Diagnosis not present

## 2021-04-14 DIAGNOSIS — E119 Type 2 diabetes mellitus without complications: Secondary | ICD-10-CM | POA: Diagnosis not present

## 2021-04-14 DIAGNOSIS — H02834 Dermatochalasis of left upper eyelid: Secondary | ICD-10-CM | POA: Diagnosis not present

## 2021-04-14 LAB — HM DIABETES EYE EXAM

## 2021-04-29 ENCOUNTER — Other Ambulatory Visit: Payer: Self-pay | Admitting: *Deleted

## 2021-04-29 NOTE — Telephone Encounter (Signed)
Rybelsus 3mg  tabs approved from 04/29/21 through 04/28/22 from [place], pt says she is not taking this right now, but may start back soon.

## 2021-04-30 ENCOUNTER — Ambulatory Visit (INDEPENDENT_AMBULATORY_CARE_PROVIDER_SITE_OTHER): Payer: BC Managed Care – PPO | Admitting: Family Medicine

## 2021-04-30 ENCOUNTER — Encounter: Payer: Self-pay | Admitting: Family Medicine

## 2021-04-30 VITALS — BP 142/76 | HR 70 | Temp 98.7°F | Ht 65.0 in | Wt 211.8 lb

## 2021-04-30 DIAGNOSIS — E1169 Type 2 diabetes mellitus with other specified complication: Secondary | ICD-10-CM

## 2021-04-30 DIAGNOSIS — K754 Autoimmune hepatitis: Secondary | ICD-10-CM | POA: Diagnosis not present

## 2021-04-30 DIAGNOSIS — I152 Hypertension secondary to endocrine disorders: Secondary | ICD-10-CM | POA: Diagnosis not present

## 2021-04-30 DIAGNOSIS — E785 Hyperlipidemia, unspecified: Secondary | ICD-10-CM | POA: Diagnosis not present

## 2021-04-30 DIAGNOSIS — E1159 Type 2 diabetes mellitus with other circulatory complications: Secondary | ICD-10-CM

## 2021-04-30 DIAGNOSIS — E1165 Type 2 diabetes mellitus with hyperglycemia: Secondary | ICD-10-CM | POA: Diagnosis not present

## 2021-04-30 LAB — BAYER DCA HB A1C WAIVED: HB A1C (BAYER DCA - WAIVED): 8.4 % — ABNORMAL HIGH (ref 4.8–5.6)

## 2021-04-30 MED ORDER — RYBELSUS 7 MG PO TABS
7.0000 mg | ORAL_TABLET | Freq: Every day | ORAL | 0 refills | Status: DC
Start: 2021-04-30 — End: 2021-06-14

## 2021-04-30 NOTE — Progress Notes (Signed)
Subjective: CC:DM PCP: [name], DO NOB:SJGG Taylor Leach is a 66 y.o. female presenting to clinic today for:  1. Type 2 Diabetes with hypertension, hyperlipidemia:  Patient has been off of any type of sugar medication for several months now.  Her last A1c was controlled.  However, she does admit to some spikes in her blood sugar as of late.  Unfortunately her brother passed away in April 05, 2023 of last year and then she also ended up with COVID-19 shortly after his funeral.  Her blood sugars as of late have been running in the 130s to 180s.  She also is not walking as much as she had been due to cold weather.  However, going back to an exercise regimen is on her to do list.  She would like to try going back on the Rybelsus.  She did not have a true allergy to it but did get some pretty bad constipation with it.  She suffers at baseline with constipation but thinks that it is worth another try.  Last eye exam: UTD Last foot exam: UTD Last A1c:  Lab Results  Component Value Date   HGBA1C 6.5 (H) 12/25/2020   Nephropathy screen indicated?: UTD Last flu, zoster and/or pneumovax:  Immunization History  Administered Date(s) Administered   Hep A / Hep B 03/24/2008, 04/23/2008, 09/08/2008   Influenza, Seasonal, Injecte, Preservative Fre 01/10/2008   Influenza,inj,Quad PF,6+ Mos 05/18/2016   Influenza-Unspecified 02/02/2018   PFIZER(Purple Top)SARS-COV-2 Vaccination 11/08/2019, 11/29/2019   Pneumococcal Polysaccharide-23 11/30/2017   Zoster Recombinat (Shingrix) 12/12/2018, 06/12/2019    ROS: No CP, SOB, edema  ROS: Per HPI  Allergies  Allergen Reactions   Fenofibrate Micronized Other (See Comments)    Caused increased LFT's Caused increased LFT's   Glipizide     Hypoglycemia    Codeine    [place] [Dapagliflozin]     Bladder pains   Hydrocodone Nausea And Vomiting and Other (See Comments)    Makes body hot.    Hydrocodone-Acetaminophen Nausea And Vomiting   Janumet  [Sitagliptin-Metformin Hcl]     Extremely bad gas    Semaglutide     GI side effects   Past Medical History:  Diagnosis Date   Arthritis    Autoimmune hepatitis ([place])    Chest pain    Stress echo normal, October, 2012   Dyslipidemia    Ejection fraction    EF 55-60%, echo, October, 2012   GERD (gastroesophageal reflux disease)     Current Outpatient Medications:    blood glucose meter kit and supplies KIT, Dispense based on patient and insurance preference. Use up to four times daily as directed. (FOR ICD-9 250.00, 250.01). E11.9, Disp: 1 each, Rfl: 0   budesonide (ENTOCORT EC) 3 MG 24 hr capsule, Take 3 mg by mouth 2 (two) times daily., Disp: , Rfl:    cetirizine (ZYRTEC) 10 MG tablet, Take by mouth., Disp: , Rfl:    CONTOUR NEXT TEST test strip, CHECK BLOOD SUGAR UP TO 4 TIMES A DAY OR AS DIRECTED, Disp: 400 each, Rfl: 2   fluorouracil (EFUDEX) 5 % cream, Apply topically 2 (two) times daily., Disp: , Rfl:    fluticasone (FLONASE) 50 MCG/ACT nasal spray, Place 1 spray into both nostrils 2 (two) times daily as needed for allergies or rhinitis., Disp: 16 g, Rfl: 6   mycophenolate (MYFORTIC) 360 MG TBEC EC tablet, Take 360 mg by mouth 2 (two) times daily., Disp: , Rfl:    pantoprazole (PROTONIX) 40 MG tablet,  Take 1 tablet (40 mg total) by mouth daily., Disp: 30 tablet, Rfl: 5 Social History   Socioeconomic History   Marital status: Married    Spouse name: Not on file   Number of children: Not on file   Years of education: Not on file   Highest education level: Not on file  Occupational History   Not on file  Tobacco Use   Smoking status: Former    Packs/day: 1.00    Types: Cigarettes   Smokeless tobacco: Never  Vaping Use   Vaping Use: Never used  Substance and Sexual Activity   Alcohol use: Yes    Alcohol/week: 1.0 standard drink    Types: 1 Glasses of wine per week   Drug use: No   Sexual activity: Yes    Birth control/protection: Surgical  Other Topics Concern    Not on file  Social History Narrative   Not on file   Social Determinants of Health   Financial Resource Strain: Not on file  Food Insecurity: Not on file  Transportation Needs: Not on file  Physical Activity: Not on file  Stress: Not on file  Social Connections: Not on file  Intimate Partner Violence: Not on file   Family History  Problem Relation Age of Onset   Cancer Mother    Cancer Father    Lupus Daughter    Arthritis/Rheumatoid Daughter    Diabetes type II Daughter     Objective: Office vital signs reviewed. BP (!) 142/76    Pulse 70    Temp 98.7 F (37.1 C)    Ht '5\' 5"'  (1.651 m)    Wt 211 lb 12.8 oz (96.1 kg)    SpO2 97%    BMI 35.25 kg/m   Physical Examination:  General: Awake, alert, morbidly obese, No acute distress HEENT: Sclera white Cardio: regular rate and rhythm, S1S2 heard, no murmurs appreciated Pulm: clear to auscultation bilaterally, no wheezes, rhonchi or rales; normal work of breathing on room air  Assessment/ Plan: 66 y.o. female   Uncontrolled type 2 diabetes mellitus with hyperglycemia ([place]) - Plan: Bayer DCA Hb A1c Waived, Microalbumin / creatinine urine ratio, Semaglutide (RYBELSUS) 7 MG TABS  Hypertension associated with diabetes ([place]) - Plan: CMP14+EGFR  Hyperlipidemia associated with type 2 diabetes mellitus ([place]) - Plan: CMP14+EGFR, IGG, Lipid Panel  Autoimmune hepatitis ([place]) - Plan: CBC, CMP14+EGFR, IGG  Home blood sugars are running above 150 intermittently.  Patient wished to trial Rybelsus again.  I counseled her on doubling her water.  I gave her a 30-day sample of the 3 mg as well as renewed her 7 mg tablets to the pharmacy.  Urine microalbumin collected today.  A1c was 8.4 today this is a big increase from her September sugar  Blood pressure was borderline today.  I am hoping that as we get some of her weight off her blood pressure will improve as well  Check lipid panel.    CMP and IgG also ordered to CC to Dr. Drue Novel.  She  has an appointment with her in March  No orders of the defined types were placed in this encounter.  No orders of the defined types were placed in this encounter.    [name], DO [place] [phone]

## 2021-05-01 LAB — CBC
Hematocrit: 41.6 % (ref 34.0–46.6)
Hemoglobin: 14.6 g/dL (ref 11.1–15.9)
MCH: 31.7 pg (ref 26.6–33.0)
MCHC: 35.1 g/dL (ref 31.5–35.7)
MCV: 90 fL (ref 79–97)
Platelets: 149 10*3/uL — ABNORMAL LOW (ref 150–450)
RBC: 4.61 x10E6/uL (ref 3.77–5.28)
RDW: 12.2 % (ref 11.7–15.4)
WBC: 5.1 10*3/uL (ref 3.4–10.8)

## 2021-05-01 LAB — CMP14+EGFR
ALT: 38 IU/L — ABNORMAL HIGH (ref 0–32)
AST: 25 IU/L (ref 0–40)
Albumin/Globulin Ratio: 1.2 (ref 1.2–2.2)
Albumin: 4.1 g/dL (ref 3.8–4.8)
Alkaline Phosphatase: 74 IU/L (ref 44–121)
BUN/Creatinine Ratio: 18 (ref 12–28)
BUN: 15 mg/dL (ref 8–27)
Bilirubin Total: 0.7 mg/dL (ref 0.0–1.2)
CO2: 22 mmol/L (ref 20–29)
Calcium: 9.2 mg/dL (ref 8.7–10.3)
Chloride: 101 mmol/L (ref 96–106)
Creatinine, Ser: 0.85 mg/dL (ref 0.57–1.00)
Globulin, Total: 3.3 g/dL (ref 1.5–4.5)
Glucose: 130 mg/dL — ABNORMAL HIGH (ref 70–99)
Potassium: 4 mmol/L (ref 3.5–5.2)
Sodium: 139 mmol/L (ref 134–144)
Total Protein: 7.4 g/dL (ref 6.0–8.5)
eGFR: 76 mL/min/{1.73_m2} (ref >59)

## 2021-05-01 LAB — LIPID PANEL
Chol/HDL Ratio: 4.1 ratio (ref 0.0–4.4)
Cholesterol, Total: 221 mg/dL — ABNORMAL HIGH (ref 100–199)
HDL: 54 mg/dL (ref >39)
LDL Chol Calc (NIH): 134 mg/dL — ABNORMAL HIGH (ref 0–99)
Triglycerides: 186 mg/dL — ABNORMAL HIGH (ref 0–149)
VLDL Cholesterol Cal: 33 mg/dL (ref 5–40)

## 2021-05-01 LAB — IGG: IgG (Immunoglobin G), Serum: 1681 mg/dL — ABNORMAL HIGH (ref 586–1602)

## 2021-05-01 LAB — MICROALBUMIN / CREATININE URINE RATIO
Creatinine, Urine: 145.7 mg/dL
Microalb/Creat Ratio: 12 mg/g creat (ref 0–29)
Microalbumin, Urine: 17.3 ug/mL

## 2021-05-21 DIAGNOSIS — H02831 Dermatochalasis of right upper eyelid: Secondary | ICD-10-CM | POA: Diagnosis not present

## 2021-05-21 DIAGNOSIS — H02834 Dermatochalasis of left upper eyelid: Secondary | ICD-10-CM | POA: Diagnosis not present

## 2021-06-07 ENCOUNTER — Encounter: Payer: Self-pay | Admitting: Family Medicine

## 2021-06-07 DIAGNOSIS — K7581 Nonalcoholic steatohepatitis (NASH): Secondary | ICD-10-CM | POA: Diagnosis not present

## 2021-06-07 DIAGNOSIS — K754 Autoimmune hepatitis: Secondary | ICD-10-CM | POA: Diagnosis not present

## 2021-06-07 DIAGNOSIS — Z6835 Body mass index (BMI) 35.0-35.9, adult: Secondary | ICD-10-CM | POA: Diagnosis not present

## 2021-06-14 ENCOUNTER — Ambulatory Visit (INDEPENDENT_AMBULATORY_CARE_PROVIDER_SITE_OTHER): Payer: BC Managed Care – PPO

## 2021-06-14 ENCOUNTER — Encounter: Payer: Self-pay | Admitting: Family Medicine

## 2021-06-14 ENCOUNTER — Ambulatory Visit (INDEPENDENT_AMBULATORY_CARE_PROVIDER_SITE_OTHER): Payer: BC Managed Care – PPO | Admitting: Family Medicine

## 2021-06-14 VITALS — BP 133/68 | HR 68 | Temp 97.8°F | Ht 65.0 in | Wt 210.0 lb

## 2021-06-14 DIAGNOSIS — E1159 Type 2 diabetes mellitus with other circulatory complications: Secondary | ICD-10-CM | POA: Diagnosis not present

## 2021-06-14 DIAGNOSIS — K754 Autoimmune hepatitis: Secondary | ICD-10-CM

## 2021-06-14 DIAGNOSIS — R161 Splenomegaly, not elsewhere classified: Secondary | ICD-10-CM

## 2021-06-14 DIAGNOSIS — E1169 Type 2 diabetes mellitus with other specified complication: Secondary | ICD-10-CM

## 2021-06-14 DIAGNOSIS — I152 Hypertension secondary to endocrine disorders: Secondary | ICD-10-CM

## 2021-06-14 DIAGNOSIS — E1165 Type 2 diabetes mellitus with hyperglycemia: Secondary | ICD-10-CM

## 2021-06-14 DIAGNOSIS — E785 Hyperlipidemia, unspecified: Secondary | ICD-10-CM

## 2021-06-14 DIAGNOSIS — Z780 Asymptomatic menopausal state: Secondary | ICD-10-CM

## 2021-06-14 MED ORDER — OZEMPIC (0.25 OR 0.5 MG/DOSE) 2 MG/1.5ML ~~LOC~~ SOPN: 0.5000 mg | PEN_INJECTOR | SUBCUTANEOUS | 3 refills | Status: DC

## 2021-06-14 MED ORDER — ATORVASTATIN CALCIUM 10 MG PO TABS: 10.0000 mg | ORAL_TABLET | Freq: Every day | ORAL | 3 refills | Status: DC

## 2021-06-14 NOTE — Progress Notes (Signed)
? ?Subjective: ?CC: Hepatitis, autoimmune ?PCP: [name], DO ?Taylor Leach is a 66 y.o. female presenting to clinic today for: ? ?1.  Autoimmune hepatitis ?Patient saw her specialist at [facility] earlier in March and she is concerned about the borderline cirrhosis of the liver and splenomegaly.  In efforts to get the patient closer to home, she is asking that we order an abdominal ultrasound to further evaluate.  She will be due for labs in April at her visit here for diabetes.  Her specialist asked that we fax all results to [phone]. ? ?2.  Type 2 diabetes associate with hypertension and hyperlipidemia ?Patient did not tolerate Rybelsus 7 mg secondary to severe constipation.  She is subsequently stopped all medicines.  However, she is interested in pursuing a GLP because of its data in fatty liver disease.  She is asking to trial Ozempic.  Her specialist also agreed that Lipitor 10 mg would be an okay dose for her to start with for her cholesterol.  Patient is willing to go on both of these. ? ? ?ROS: Per HPI ? ?Allergies  ?Allergen Reactions  ? Fenofibrate Micronized Other (See Comments)  ?  Caused increased LFT's ?Caused increased LFT's  ? Glipizide   ?  Hypoglycemia ?  ? Codeine   ? Wilder Glade [Dapagliflozin]   ?  Bladder pains  ? Hydrocodone Nausea And Vomiting and Other (See Comments)  ?  Makes body hot.   ? Hydrocodone-Acetaminophen Nausea And Vomiting  ? Janumet [Sitagliptin-Metformin Hcl]   ?  Extremely bad gas ?  ? ?Past Medical History:  ?Diagnosis Date  ? Arthritis   ? Autoimmune hepatitis ([place])   ? Chest pain   ? Stress echo normal, October, 2012  ? Dyslipidemia   ? Ejection fraction   ? EF 55-60%, echo, October, 2012  ? GERD (gastroesophageal reflux disease)   ? ? ?Current Outpatient Medications:  ?  blood glucose meter kit and supplies KIT, Dispense based on patient and insurance preference. Use up to four times daily as directed. (FOR ICD-9 250.00, 250.01). E11.9, Disp: 1 each,  Rfl: 0 ?  budesonide (ENTOCORT EC) 3 MG 24 hr capsule, Take 3 mg by mouth 2 (two) times daily., Disp: , Rfl:  ?  cetirizine (ZYRTEC) 10 MG tablet, Take by mouth., Disp: , Rfl:  ?  CONTOUR NEXT TEST test strip, CHECK BLOOD SUGAR UP TO 4 TIMES A DAY OR AS DIRECTED, Disp: 400 each, Rfl: 2 ?  fluorouracil (EFUDEX) 5 % cream, Apply topically 2 (two) times daily., Disp: , Rfl:  ?  fluticasone (FLONASE) 50 MCG/ACT nasal spray, Place 1 spray into both nostrils 2 (two) times daily as needed for allergies or rhinitis., Disp: 16 g, Rfl: 6 ?  mycophenolate (MYFORTIC) 360 MG TBEC EC tablet, Take 360 mg by mouth 2 (two) times daily., Disp: , Rfl:  ?  pantoprazole (PROTONIX) 40 MG tablet, Take 1 tablet (40 mg total) by mouth daily., Disp: 30 tablet, Rfl: 5 ?Social History  ? ?Socioeconomic History  ? Marital status: Married  ?  Spouse name: Not on file  ? Number of children: Not on file  ? Years of education: Not on file  ? Highest education level: Not on file  ?Occupational History  ? Not on file  ?Tobacco Use  ? Smoking status: Former  ?  Packs/day: 1.00  ?  Types: Cigarettes  ? Smokeless tobacco: Never  ?Vaping Use  ? Vaping Use: Never used  ?Substance and  Sexual Activity  ? Alcohol use: Yes  ?  Alcohol/week: 1.0 standard drink  ?  Types: 1 Glasses of wine per week  ? Drug use: No  ? Sexual activity: Yes  ?  Birth control/protection: Surgical  ?Other Topics Concern  ? Not on file  ?Social History Narrative  ? Not on file  ? ?Social Determinants of Health  ? ?Financial Resource Strain: Not on file  ?Food Insecurity: Not on file  ?Transportation Needs: Not on file  ?Physical Activity: Not on file  ?Stress: Not on file  ?Social Connections: Not on file  ?Intimate Partner Violence: Not on file  ? ?Family History  ?Problem Relation Age of Onset  ? Cancer Mother   ? Cancer Father   ? Lupus Daughter   ? Arthritis/Rheumatoid Daughter   ? Diabetes type II Daughter   ? ? ?Objective: ?Office vital signs reviewed. ?BP 133/68   Pulse 68    Temp 97.8 ?F (36.6 ?C) (Temporal)   Ht '5\' 5"'  (1.651 m)   Wt 210 lb (95.3 kg)   BMI 34.95 kg/m?  ? ?Physical Examination:  ?General: Awake, alert, obese, No acute distress ?HEENT: Sclera white ?Cardio: regular rate and rhythm  ?Pulm: Normal work of breathing on room air ?GI: Protuberant ?MSK: Ambulating independently ? ?Assessment/ Plan: ?66 y.o. female  ? ?Uncontrolled type 2 diabetes mellitus with hyperglycemia ([place]) - Plan: Semaglutide,0.25 or 0.5MG/DOS, (OZEMPIC, 0.25 OR 0.5 MG/DOSE,) 2 MG/1.5ML SOPN ? ?Hypertension associated with diabetes ([place]) ? ?Hyperlipidemia associated with type 2 diabetes mellitus ([place]) - Plan: atorvastatin (LIPITOR) 10 MG tablet ? ?Autoimmune hepatitis ([place]) - Plan: US Abdomen Complete ? ?Splenomegaly - Plan: US Abdomen Complete ? ?Asymptomatic postmenopausal estrogen deficiency - Plan: DG [facility] DEXA ? ?Trial of Ozempic 0.25 mg q. 7 days.  I agree that the GLP is probably in her best interest given the current studies for fatty liver disease and her uncontrolled diabetes.  I encouraged her to drink as much water as she can in efforts to reduce possible constipation side effects.  Alternatively, considering Actos given that has good data and fatty liver disease as well along with a low-dose of metformin ? ?Her blood pressure is well controlled today but she has apparently been having some fluctuations so would like her to monitor at home.  Goal less than 140/90 ? ?Lipitor at low-dose started.  We will plan for fasting lipid panel as well as repeat LFTs at next visit ? ?Ultrasound has been ordered per her specialist request.  We will CC the results once available for further management [phone] ? ?DEXA scan also ordered ? ?No orders of the defined types were placed in this encounter. ? ?No orders of the defined types were placed in this encounter. ? ? ? ?[name], DO ?[place] ?[phone] ? ? ?

## 2021-06-15 DIAGNOSIS — M85851 Other specified disorders of bone density and structure, right thigh: Secondary | ICD-10-CM | POA: Diagnosis not present

## 2021-06-19 DIAGNOSIS — Z1231 Encounter for screening mammogram for malignant neoplasm of breast: Secondary | ICD-10-CM | POA: Diagnosis not present

## 2021-06-25 ENCOUNTER — Ambulatory Visit (HOSPITAL_COMMUNITY)
Admission: RE | Admit: 2021-06-25 | Discharge: 2021-06-25 | Disposition: A | Discharge status: Home / Self Care | Payer: BC Managed Care – PPO | Source: Ambulatory Visit | Attending: Family Medicine | Admitting: Family Medicine

## 2021-06-25 ENCOUNTER — Other Ambulatory Visit: Payer: Self-pay

## 2021-06-25 DIAGNOSIS — K7689 Other specified diseases of liver: Secondary | ICD-10-CM | POA: Diagnosis not present

## 2021-06-25 DIAGNOSIS — Z9049 Acquired absence of other specified parts of digestive tract: Secondary | ICD-10-CM | POA: Diagnosis not present

## 2021-06-25 DIAGNOSIS — K754 Autoimmune hepatitis: Secondary | ICD-10-CM | POA: Insufficient documentation

## 2021-06-25 DIAGNOSIS — R161 Splenomegaly, not elsewhere classified: Secondary | ICD-10-CM | POA: Insufficient documentation

## 2021-07-07 ENCOUNTER — Other Ambulatory Visit: Payer: Self-pay | Admitting: Family Medicine

## 2021-07-07 DIAGNOSIS — K219 Gastro-esophageal reflux disease without esophagitis: Secondary | ICD-10-CM

## 2021-07-15 DIAGNOSIS — H02834 Dermatochalasis of left upper eyelid: Secondary | ICD-10-CM | POA: Diagnosis not present

## 2021-07-15 DIAGNOSIS — H53453 Other localized visual field defect, bilateral: Secondary | ICD-10-CM | POA: Diagnosis not present

## 2021-07-15 DIAGNOSIS — H02831 Dermatochalasis of right upper eyelid: Secondary | ICD-10-CM | POA: Diagnosis not present

## 2021-07-15 HISTORY — PX: EYE SURGERY: SHX253

## 2021-07-29 ENCOUNTER — Encounter: Payer: Self-pay | Admitting: Family Medicine

## 2021-07-29 ENCOUNTER — Ambulatory Visit (INDEPENDENT_AMBULATORY_CARE_PROVIDER_SITE_OTHER): Payer: BC Managed Care – PPO | Admitting: Family Medicine

## 2021-07-29 VITALS — BP 129/66 | HR 70 | Temp 98.0°F | Ht 65.0 in | Wt 205.8 lb

## 2021-07-29 DIAGNOSIS — K754 Autoimmune hepatitis: Secondary | ICD-10-CM | POA: Diagnosis not present

## 2021-07-29 DIAGNOSIS — E1159 Type 2 diabetes mellitus with other circulatory complications: Secondary | ICD-10-CM | POA: Diagnosis not present

## 2021-07-29 DIAGNOSIS — L309 Dermatitis, unspecified: Secondary | ICD-10-CM

## 2021-07-29 DIAGNOSIS — E785 Hyperlipidemia, unspecified: Secondary | ICD-10-CM

## 2021-07-29 DIAGNOSIS — E1169 Type 2 diabetes mellitus with other specified complication: Secondary | ICD-10-CM

## 2021-07-29 DIAGNOSIS — I152 Hypertension secondary to endocrine disorders: Secondary | ICD-10-CM

## 2021-07-29 LAB — BAYER DCA HB A1C WAIVED: HB A1C (BAYER DCA - WAIVED): 6.8 % — ABNORMAL HIGH (ref 4.8–5.6)

## 2021-07-29 MED ORDER — TRIAMCINOLONE ACETONIDE 0.1 % EX CREA: 1.0000 "application " | TOPICAL_CREAM | Freq: Two times a day (BID) | CUTANEOUS | 1 refills | Status: DC | PRN

## 2021-07-29 MED ORDER — OZEMPIC (0.25 OR 0.5 MG/DOSE) 2 MG/3ML ~~LOC~~ SOPN
0.5000 mg | PEN_INJECTOR | SUBCUTANEOUS | 3 refills | Status: DC
Start: 2021-07-29 — End: 2021-10-29

## 2021-07-29 NOTE — Progress Notes (Signed)
? ?Subjective: ?CC: Chronic follow-up, autoimmune hepatitis ?PCP: [name], DO ?Taylor Leach is a 66 y.o. female presenting to clinic today for: ? ?1.  Autoimmune hepatitis/type 2 diabetes associate with hyperlipidemia and hypertension ?Patient has been taking the Ozempic subcutaneously 0.25 mg each week without any difficulty.  She reports no negative side effects including nausea, vomiting, abdominal pain.  Her blood sugars have been running somewhere between 97 and 130s.  No hypoglycemic episodes reported.  She is hoping that this is having a positive improvement on her liver function.  Here for labs today and wishes these to be sent to her specialist ? ?2.  Ear irritation ?Patient reports flaking and itchiness of the right external ear.  She thinks that she has eczema and is wondering what she can use ? ? ?ROS: Per HPI ? ?Allergies  ?Allergen Reactions  ? Fenofibrate Micronized Other (See Comments)  ?  Caused increased LFT's ?Caused increased LFT's  ? Glipizide   ?  Hypoglycemia ?  ? Codeine   ? Wilder Glade [Dapagliflozin]   ?  Bladder pains  ? Hydrocodone Nausea And Vomiting and Other (See Comments)  ?  Makes body hot.   ? Hydrocodone-Acetaminophen Nausea And Vomiting  ? Janumet [Sitagliptin-Metformin Hcl]   ?  Extremely bad gas ?  ? ?Past Medical History:  ?Diagnosis Date  ? Arthritis   ? Autoimmune hepatitis ([place])   ? Chest pain   ? Stress echo normal, October, 2012  ? Dyslipidemia   ? Ejection fraction   ? EF 55-60%, echo, October, 2012  ? GERD (gastroesophageal reflux disease)   ? ? ?Current Outpatient Medications:  ?  atorvastatin (LIPITOR) 10 MG tablet, Take 1 tablet (10 mg total) by mouth daily., Disp: 90 tablet, Rfl: 3 ?  blood glucose meter kit and supplies KIT, Dispense based on patient and insurance preference. Use up to four times daily as directed. (FOR ICD-9 250.00, 250.01). E11.9, Disp: 1 each, Rfl: 0 ?  budesonide (ENTOCORT EC) 3 MG 24 hr capsule, Take 3 mg by mouth 2 (two) times  daily., Disp: , Rfl:  ?  cetirizine (ZYRTEC) 10 MG tablet, Take by mouth., Disp: , Rfl:  ?  CONTOUR NEXT TEST test strip, CHECK BLOOD SUGAR UP TO 4 TIMES A DAY OR AS DIRECTED, Disp: 400 each, Rfl: 2 ?  erythromycin ophthalmic ointment, as directed., Disp: , Rfl:  ?  fluorouracil (EFUDEX) 5 % cream, Apply topically 2 (two) times daily., Disp: , Rfl:  ?  mycophenolate (MYFORTIC) 360 MG TBEC EC tablet, Take 360 mg by mouth 2 (two) times daily., Disp: , Rfl:  ?  pantoprazole (PROTONIX) 40 MG tablet, TAKE 1 TABLET DAILY, Disp: 30 tablet, Rfl: 0 ?  Semaglutide,0.25 or 0.5MG/DOS, (OZEMPIC, 0.25 OR 0.5 MG/DOSE,) 2 MG/1.5ML SOPN, Inject 0.5 mg into the skin once a week., Disp: 1.5 mL, Rfl: 3 ?  fluticasone (FLONASE) 50 MCG/ACT nasal spray, Place 1 spray into both nostrils 2 (two) times daily as needed for allergies or rhinitis. (Patient not taking: Reported on 07/29/2021), Disp: 16 g, Rfl: 6 ?Social History  ? ?Socioeconomic History  ? Marital status: Married  ?  Spouse name: Not on file  ? Number of children: Not on file  ? Years of education: Not on file  ? Highest education level: Not on file  ?Occupational History  ? Not on file  ?Tobacco Use  ? Smoking status: Former  ?  Packs/day: 1.00  ?  Types: Cigarettes  ? Smokeless  tobacco: Never  ?Vaping Use  ? Vaping Use: Never used  ?Substance and Sexual Activity  ? Alcohol use: Yes  ?  Alcohol/week: 1.0 standard drink  ?  Types: 1 Glasses of wine per week  ? Drug use: No  ? Sexual activity: Yes  ?  Birth control/protection: Surgical  ?Other Topics Concern  ? Not on file  ?Social History Narrative  ? Not on file  ? ?Social Determinants of Health  ? ?Financial Resource Strain: Not on file  ?Food Insecurity: Not on file  ?Transportation Needs: Not on file  ?Physical Activity: Not on file  ?Stress: Not on file  ?Social Connections: Not on file  ?Intimate Partner Violence: Not on file  ? ?Family History  ?Problem Relation Age of Onset  ? Cancer Mother   ? Cancer Father   ? Lupus  Daughter   ? Arthritis/Rheumatoid Daughter   ? Diabetes type II Daughter   ? ? ?Objective: ?Office vital signs reviewed. ?BP 129/66   Pulse 70   Temp 98 ?F (36.7 ?C)   Ht 5' 5" (1.651 m)   Wt 205 lb 12.8 oz (93.4 kg)   SpO2 99%   BMI 34.25 kg/m?  ? ?Physical Examination:  ?General: Awake, alert, morbidly obese, No acute distress ?HEENT: Right ear with some dermatitis of the external auditory canal ?Cardio: regular rate and rhythm, S1S2 heard, no murmurs appreciated ?Pulm: clear to auscultation bilaterally, no wheezes, rhonchi or rales; normal work of breathing on room air ?GI: Splenomegaly is present.  Abdomen obese but soft and nontender. ? ? ?Assessment/ Plan: ?66 y.o. female  ? ?Controlled type 2 diabetes mellitus with other specified complication, without long-term current use of insulin ([place]) - Plan: Bayer DCA Hb A1c Waived, Semaglutide,0.25 or 0.5MG/DOS, (OZEMPIC, 0.25 OR 0.5 MG/DOSE,) 2 MG/3ML SOPN, CANCELED: Bayer DCA Hb A1c Waived ? ?Hyperlipidemia associated with type 2 diabetes mellitus ([place]) - Plan: Lipid Panel ? ?Autoimmune hepatitis ([place]) - Plan: CBC, Hepatic Function Panel, IGG, CANCELED: Hepatic Function Panel, CANCELED: IGG, CANCELED: CBC with Differential ? ?Hypertension associated with diabetes ([place]) ? ?Morbid obesity ([place]) ? ?Dermatitis of external ear - Plan: triamcinolone cream (KENALOG) 0.1 % ? ?Tolerating Ozempic without difficulty.  Recheck liver function tests, lipid panel.  A1c shows excellent control at 6.8 today. ? ?Blood pressure is controlled for age.  No changes ? ?We will advance Ozempic as above and hopefully we will continue to see some positive improvement in weight ? ?For the dermatitis she is experiencing on the ear I have given her triamcinolone.  She may apply to the affected area twice daily if needed ? ?May follow-up in 3 months for repeat A1c, weight and liver function tests ? ?No orders of the defined types were placed in this encounter. ? ?No orders of the defined  types were placed in this encounter. ? ? ? ?[name], DO ?[place] ?[phone] ? ?

## 2021-07-30 LAB — HEPATIC FUNCTION PANEL
ALT: 29 IU/L (ref 0–32)
AST: 22 IU/L (ref 0–40)
Albumin: 4.1 g/dL (ref 3.8–4.8)
Alkaline Phosphatase: 68 IU/L (ref 44–121)
Bilirubin Total: 0.6 mg/dL (ref 0.0–1.2)
Bilirubin, Direct: 0.18 mg/dL (ref 0.00–0.40)
Total Protein: 7.4 g/dL (ref 6.0–8.5)

## 2021-07-30 LAB — CBC
Hematocrit: 42 % (ref 34.0–46.6)
Hemoglobin: 14.7 g/dL (ref 11.1–15.9)
MCH: 32 pg (ref 26.6–33.0)
MCHC: 35 g/dL (ref 31.5–35.7)
MCV: 91 fL (ref 79–97)
Platelets: 175 10*3/uL (ref 150–450)
RBC: 4.6 x10E6/uL (ref 3.77–5.28)
RDW: 12.4 % (ref 11.7–15.4)
WBC: 5.7 10*3/uL (ref 3.4–10.8)

## 2021-07-30 LAB — LIPID PANEL
Chol/HDL Ratio: 2.8 ratio (ref 0.0–4.4)
Cholesterol, Total: 158 mg/dL (ref 100–199)
HDL: 56 mg/dL (ref >39)
LDL Chol Calc (NIH): 78 mg/dL (ref 0–99)
Triglycerides: 139 mg/dL (ref 0–149)
VLDL Cholesterol Cal: 24 mg/dL (ref 5–40)

## 2021-07-30 LAB — IGG: IgG (Immunoglobin G), Serum: 1623 mg/dL — ABNORMAL HIGH (ref 586–1602)

## 2021-08-16 ENCOUNTER — Other Ambulatory Visit: Payer: Self-pay | Admitting: Family Medicine

## 2021-08-16 DIAGNOSIS — K219 Gastro-esophageal reflux disease without esophagitis: Secondary | ICD-10-CM

## 2021-08-23 DIAGNOSIS — N814 Uterovaginal prolapse, unspecified: Secondary | ICD-10-CM | POA: Diagnosis not present

## 2021-08-23 DIAGNOSIS — Z6834 Body mass index (BMI) 34.0-34.9, adult: Secondary | ICD-10-CM | POA: Diagnosis not present

## 2021-09-22 ENCOUNTER — Other Ambulatory Visit: Payer: Self-pay | Admitting: Family Medicine

## 2021-09-22 DIAGNOSIS — K219 Gastro-esophageal reflux disease without esophagitis: Secondary | ICD-10-CM

## 2021-10-18 IMAGING — DX DG CHEST 2V
2 series · 2 of 2 positions shown · non-contrast
Comparison: 08/10/2016

CLINICAL DATA: Cardiac palpitations

EXAM:
CHEST - 2 VIEW

[chest pa]
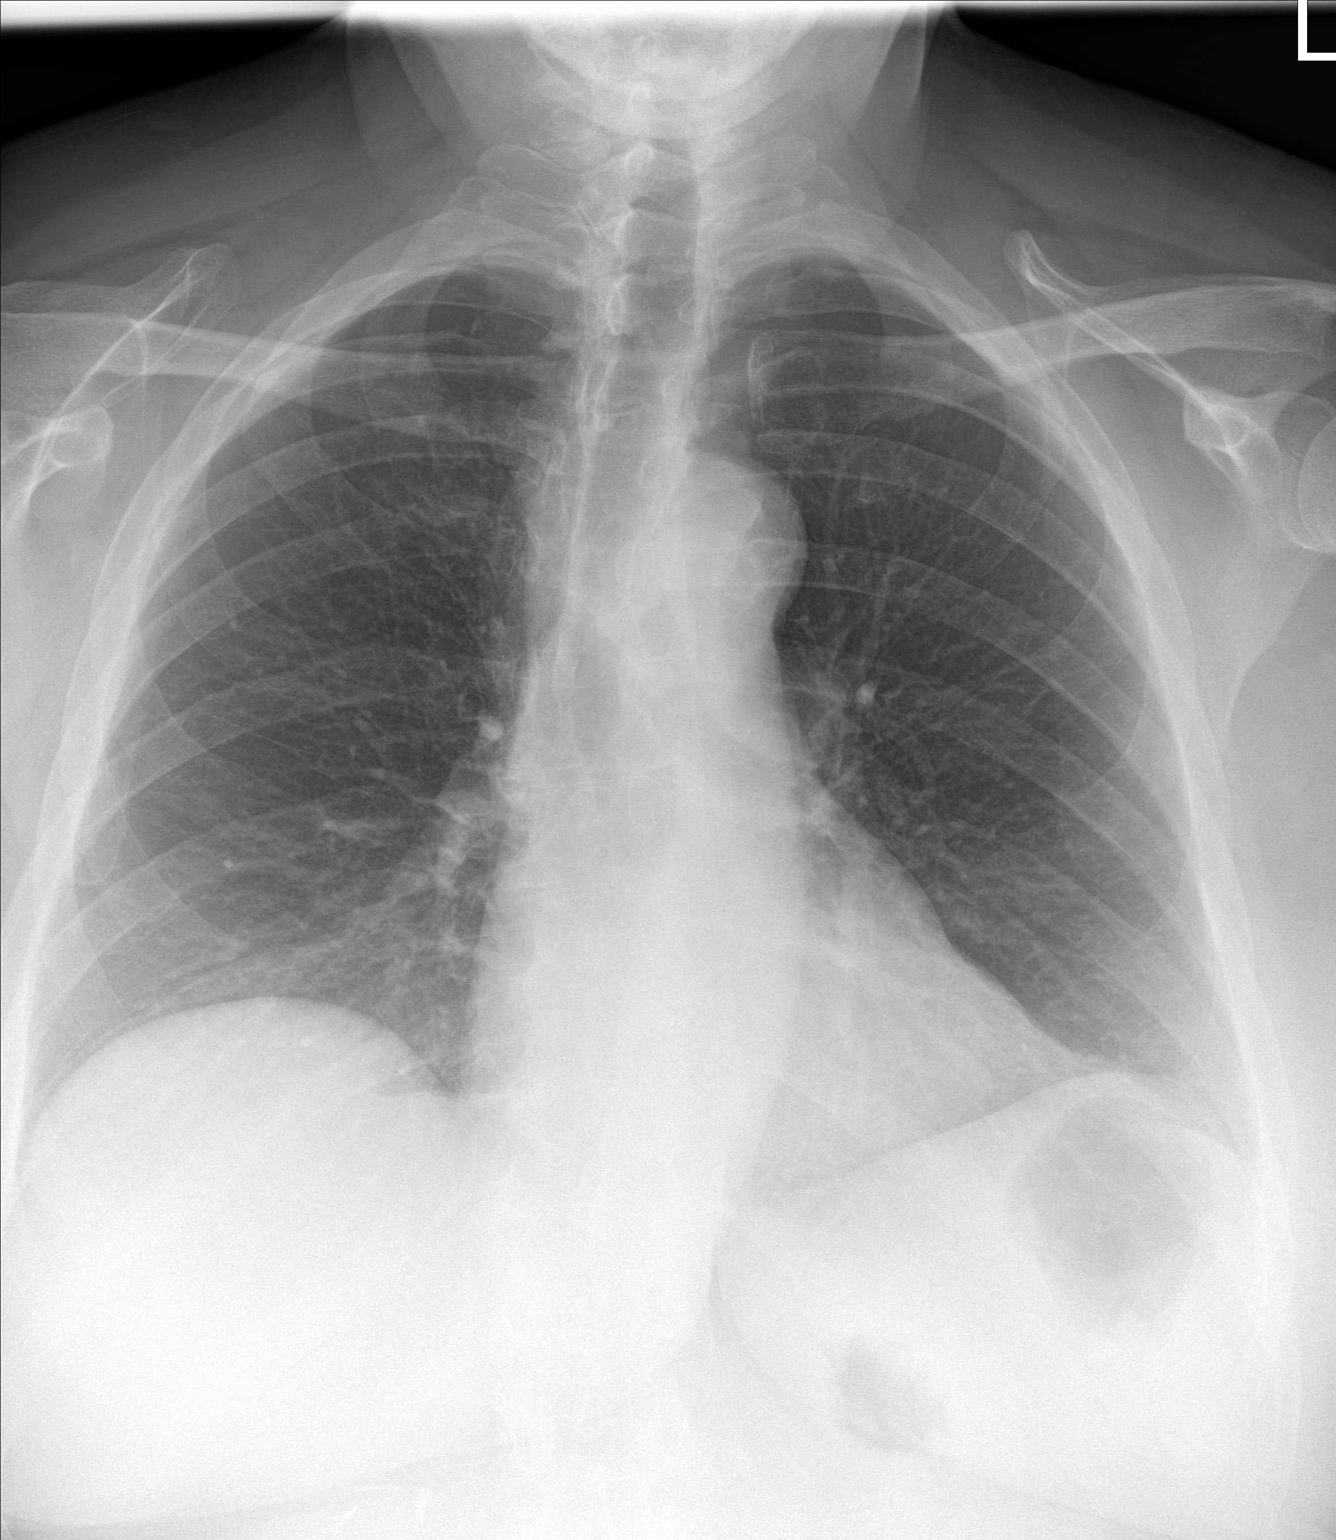

[chest lat]
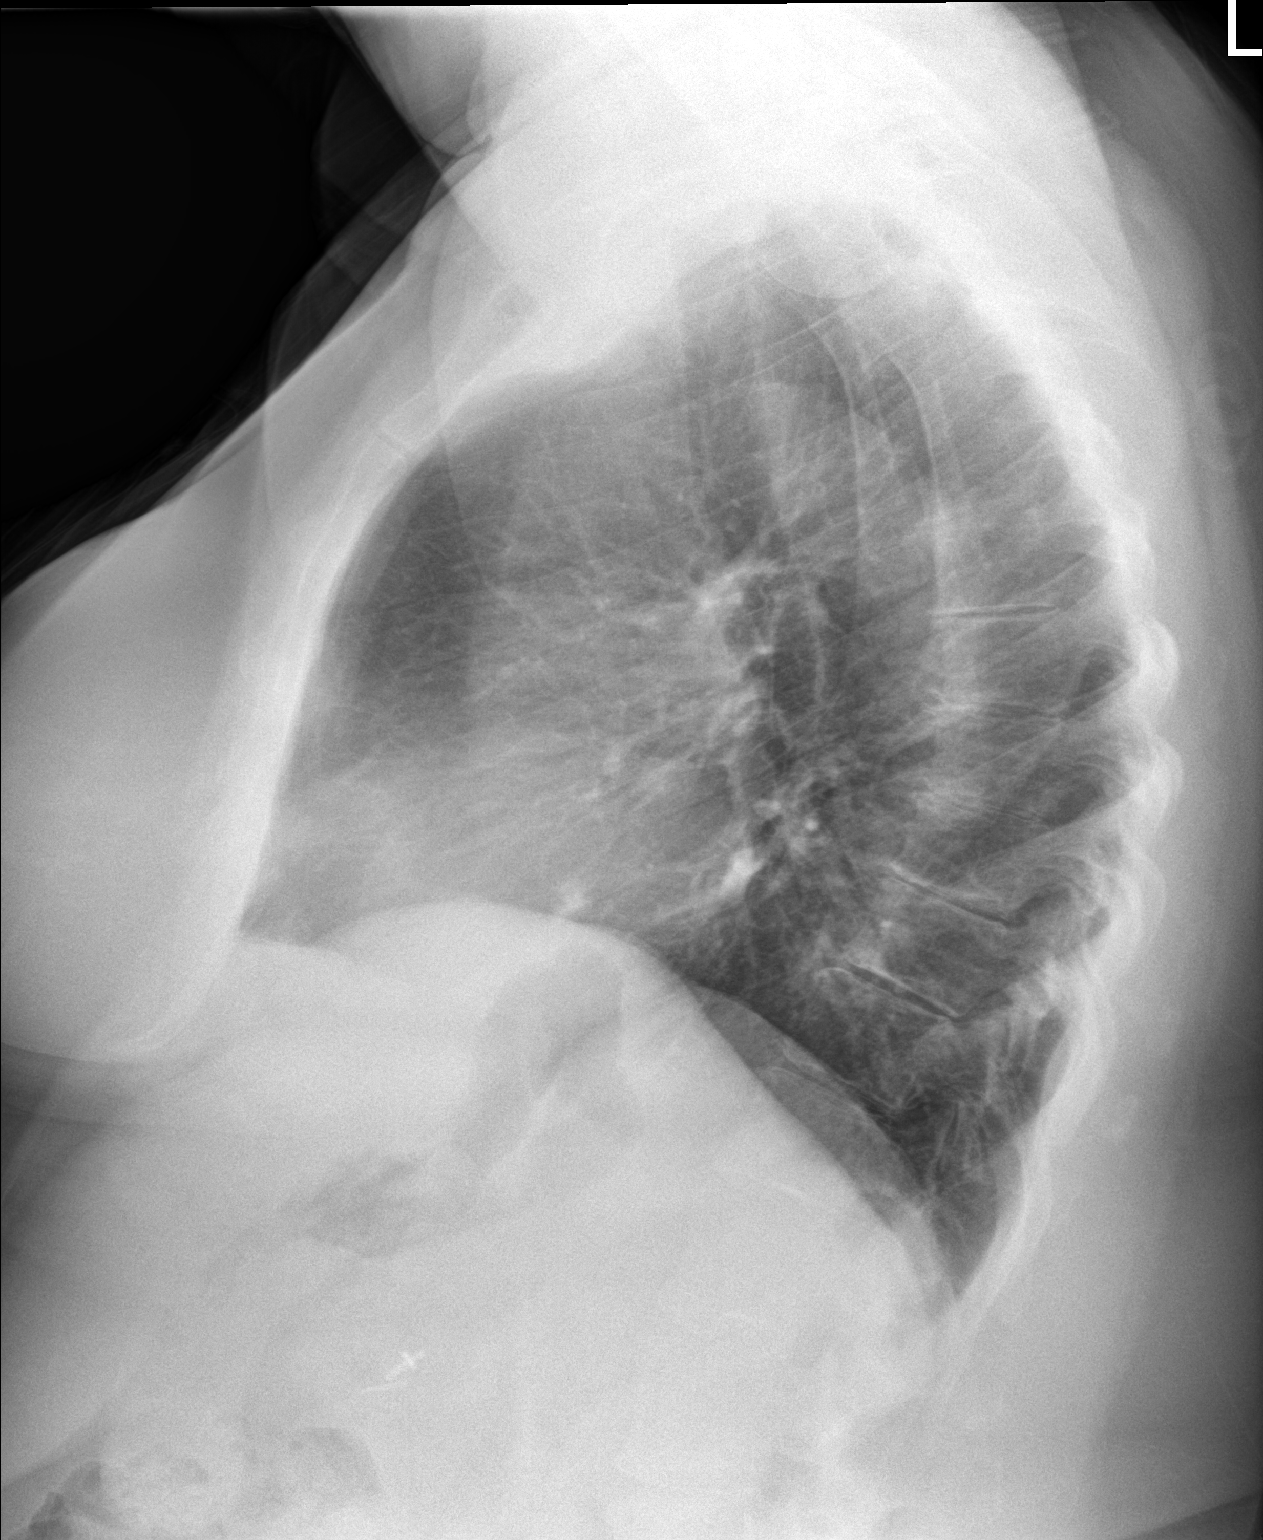

[2 of 2 positions shown; findings below may reference images not displayed]

FINDINGS: The heart size and mediastinal contours are within normal limits.
Both lungs are clear. The visualized skeletal structures show
degenerative change of the thoracic spine.
IMPRESSION: No active cardiopulmonary disease.

## 2021-10-29 ENCOUNTER — Ambulatory Visit (INDEPENDENT_AMBULATORY_CARE_PROVIDER_SITE_OTHER): Payer: Medicare Other | Admitting: Family Medicine

## 2021-10-29 ENCOUNTER — Encounter: Payer: Self-pay | Admitting: Family Medicine

## 2021-10-29 VITALS — BP 125/69 | HR 73 | Temp 98.3°F | Ht 65.0 in | Wt 202.2 lb

## 2021-10-29 DIAGNOSIS — K219 Gastro-esophageal reflux disease without esophagitis: Secondary | ICD-10-CM

## 2021-10-29 DIAGNOSIS — E785 Hyperlipidemia, unspecified: Secondary | ICD-10-CM

## 2021-10-29 DIAGNOSIS — E1169 Type 2 diabetes mellitus with other specified complication: Secondary | ICD-10-CM | POA: Diagnosis not present

## 2021-10-29 DIAGNOSIS — K754 Autoimmune hepatitis: Secondary | ICD-10-CM

## 2021-10-29 DIAGNOSIS — E1159 Type 2 diabetes mellitus with other circulatory complications: Secondary | ICD-10-CM

## 2021-10-29 DIAGNOSIS — Z23 Encounter for immunization: Secondary | ICD-10-CM

## 2021-10-29 DIAGNOSIS — I152 Hypertension secondary to endocrine disorders: Secondary | ICD-10-CM

## 2021-10-29 DIAGNOSIS — J301 Allergic rhinitis due to pollen: Secondary | ICD-10-CM

## 2021-10-29 LAB — BAYER DCA HB A1C WAIVED: HB A1C (BAYER DCA - WAIVED): 5.9 % — ABNORMAL HIGH (ref 4.8–5.6)

## 2021-10-29 MED ORDER — PANTOPRAZOLE SODIUM 40 MG PO TBEC: 40.0000 mg | DELAYED_RELEASE_TABLET | Freq: Every day | ORAL | 3 refills | Status: DC

## 2021-10-29 MED ORDER — FLUTICASONE PROPIONATE 50 MCG/ACT NA SUSP: 1.0000 | Freq: Two times a day (BID) | NASAL | 1 refills | Status: DC | PRN

## 2021-10-29 MED ORDER — OZEMPIC (0.25 OR 0.5 MG/DOSE) 2 MG/3ML ~~LOC~~ SOPN: 0.5000 mg | PEN_INJECTOR | SUBCUTANEOUS | 3 refills | Status: DC

## 2021-10-29 MED ORDER — ATORVASTATIN CALCIUM 10 MG PO TABS: 10.0000 mg | ORAL_TABLET | Freq: Every day | ORAL | 3 refills | Status: DC

## 2021-10-29 NOTE — Progress Notes (Signed)
Subjective: CC:DM PCP: Taylor Norlander, DO TDS:KAJG Taylor Leach is a 66 y.o. female presenting to clinic today for:  1. Type 2 Diabetes with hypertension, hyperlipidemia:  She is compliant with Lipitor, Ozempic.  Her blood pressure has been diet controlled.  She continues see hepatology for her autoimmune hepatitis.  She has a new hepatologist now as Dr. Drue Novel has left the practice.  She will still be going to the same clinic however.  She is tolerating the Ozempic without difficulty.  Occasionally has some constipation but this is relieved by stool softeners  Last eye exam: Up-to-date Last foot exam: Needs Last A1c:  Lab Results  Component Value Date   HGBA1C 5.9 (H) 10/29/2021   Nephropathy screen indicated?:  Up-to-date Last flu, zoster and/or pneumovax:  Immunization History  Administered Date(s) Administered   Hep A / Hep B 03/24/2008, 04/23/2008, 09/08/2008   Influenza, Seasonal, Injecte, Preservative Fre 01/10/2008   Influenza,inj,Quad PF,6+ Mos 05/18/2016   Influenza-Unspecified 02/02/2018   PFIZER(Purple Top)SARS-COV-2 Vaccination 11/08/2019, 11/29/2019   PNEUMOCOCCAL CONJUGATE-20 10/29/2021   Pneumococcal Polysaccharide-23 11/30/2017   Zoster Recombinat (Shingrix) 12/12/2018, 06/12/2019    ROS: No chest pain shortness of breath, abdominal pain.    ROS: Per HPI  Allergies  Allergen Reactions   Fenofibrate Micronized Other (See Comments)    Caused increased LFT's Caused increased LFT's   Glipizide     Hypoglycemia    Codeine    [place] [Dapagliflozin]     Bladder pains   Hydrocodone Nausea And Vomiting and Other (See Comments)    Makes body hot.    Hydrocodone-Acetaminophen Nausea And Vomiting   Janumet [Sitagliptin-Metformin Hcl]     Extremely bad gas    Past Medical History:  Diagnosis Date   Arthritis    Autoimmune hepatitis ([place])    Chest pain    Stress echo normal, October, 2012   Dyslipidemia    Ejection fraction    EF 55-60%, echo,  October, 2012   GERD (gastroesophageal reflux disease)     Current Outpatient Medications:    atorvastatin (LIPITOR) 10 MG tablet, Take 1 tablet (10 mg total) by mouth daily., Disp: 90 tablet, Rfl: 3   blood glucose meter kit and supplies KIT, Dispense based on patient and insurance preference. Use up to four times daily as directed. (FOR ICD-9 250.00, 250.01). E11.9, Disp: 1 each, Rfl: 0   budesonide (ENTOCORT EC) 3 MG 24 hr capsule, Take 3 mg by mouth 2 (two) times daily., Disp: , Rfl:    cetirizine (ZYRTEC) 10 MG tablet, Take by mouth., Disp: , Rfl:    CONTOUR NEXT TEST test strip, CHECK BLOOD SUGAR UP TO 4 TIMES A DAY OR AS DIRECTED, Disp: 400 each, Rfl: 2   erythromycin ophthalmic ointment, as directed., Disp: , Rfl:    fluorouracil (EFUDEX) 5 % cream, Apply topically 2 (two) times daily., Disp: , Rfl:    mycophenolate (MYFORTIC) 360 MG TBEC EC tablet, Take 360 mg by mouth 2 (two) times daily., Disp: , Rfl:    pantoprazole (PROTONIX) 40 MG tablet, TAKE ONE TABLET ONCE DAILY, Disp: 30 tablet, Rfl: 1   Semaglutide,0.25 or 0.5MG/DOS, (OZEMPIC, 0.25 OR 0.5 MG/DOSE,) 2 MG/3ML SOPN, Inject 0.5 mg into the skin every 7 (seven) days., Disp: 9 mL, Rfl: 3   triamcinolone cream (KENALOG) 0.1 %, Apply 1 application. topically 2 (two) times daily as needed (eczema flare for up to 7 days). To affected area on ear, Disp: 30 g, Rfl: 1   fluticasone (  FLONASE) 50 MCG/ACT nasal spray, Place 1 spray into both nostrils 2 (two) times daily as needed for allergies or rhinitis. (Patient not taking: Reported on 07/29/2021), Disp: 16 g, Rfl: 6 Social History   Socioeconomic History   Marital status: Married    Spouse name: Not on file   Number of children: Not on file   Years of education: Not on file   Highest education level: Not on file  Occupational History   Not on file  Tobacco Use   Smoking status: Former    Packs/day: 1.00    Types: Cigarettes   Smokeless tobacco: Never  Vaping Use   Vaping Use:  Never used  Substance and Sexual Activity   Alcohol use: Yes    Alcohol/week: 1.0 standard drink of alcohol    Types: 1 Glasses of wine per week   Drug use: No   Sexual activity: Yes    Birth control/protection: Surgical  Other Topics Concern   Not on file  Social History Narrative   Not on file   Social Determinants of Health   Financial Resource Strain: Not on file  Food Insecurity: Not on file  Transportation Needs: Not on file  Physical Activity: Not on file  Stress: Not on file  Social Connections: Not on file  Intimate Partner Violence: Not on file   Family History  Problem Relation Age of Onset   Cancer Mother    Cancer Father    Lupus Daughter    Arthritis/Rheumatoid Daughter    Diabetes type II Daughter     Objective: Office vital signs reviewed. BP 125/69   Pulse 73   Temp 98.3 F (36.8 C)   Ht 5' 5" (1.651 m)   Wt 202 lb 3.2 oz (91.7 kg)   SpO2 95%   BMI 33.65 kg/m   Physical Examination:  General: Awake, alert, well nourished, No acute distress HEENT: sclera white, MMM Cardio: regular rate and rhythm, S1S2 heard, no murmurs appreciated Pulm: clear to auscultation bilaterally, no wheezes, rhonchi or rales; normal work of breathing on room air Extremities: warm, well perfused, No edema, cyanosis or clubbing; +2 pulses bilaterally Skin: dry; intact; no rashes or lesions Neuro: see DM foot Diabetic Foot Exam - Simple   Simple Foot Form Diabetic Foot exam was performed with the following findings: Yes 10/29/2021  9:43 AM  Visual Inspection No deformities, no ulcerations, no other skin breakdown bilaterally: Yes Sensation Testing Intact to touch and monofilament testing bilaterally: Yes Pulse Check Posterior Tibialis and Dorsalis pulse intact bilaterally: Yes Comments Monofilament sensation is intact but is slightly dulled on the left compared to the right.  She has history of previous foot surgery on that side.  She has onychomycotic changes to the  nails bilaterally      Assessment/ Plan: 66 y.o. female   Controlled type 2 diabetes mellitus with other specified complication, without long-term current use of insulin ([place]) - Plan: Bayer DCA Hb A1c Waived, Semaglutide,0.25 or 0.5MG/DOS, (OZEMPIC, 0.25 OR 0.5 MG/DOSE,) 2 MG/3ML SOPN, CANCELED: Bayer DCA Hb A1c Waived  Need for pneumococcal vaccination - Plan: Pneumococcal conjugate vaccine 20-valent (Prevnar 20)  Autoimmune hepatitis ([place]) - Plan: CBC, Hepatic Function Panel, IGG  Hyperlipidemia associated with type 2 diabetes mellitus ([place]) - Plan: atorvastatin (LIPITOR) 10 MG tablet  Hypertension associated with diabetes (H[facility])  Seasonal allergic rhinitis due to pollen - Plan: fluticasone (FLONASE) 50 MCG/ACT nasal spray  Gastroesophageal reflux disease without esophagitis - Plan: pantoprazole (PROTONIX) 40 MG tablet  Sugar remains under excellent control with A1c of less than 6 today.  Ozempic has been renewed and sent to new mail order pharmacy.  I gave her a sample of this 2-day as well to bridge her.  Of note she is doing extremely well on the Ozempic  Pneumococcal vaccination was administered now that she is over 65  CBC, hepatic function panel and IgG have been ordered and we will send this to her new hepatologist, Dr Harle Battiest, and fax to [phone]  Blood pressure stable.  Continue current regimen.  Continue Lipitor for cholesterol.    Flonase and Protonix were renewed but associated diagnoses were not formally addressed during the visit    Orders Placed This Encounter  Procedures   Pneumococcal conjugate vaccine 20-valent (Prevnar 20)   No orders of the defined types were placed in this encounter.    Taylor Norlander, DO [place] [phone]

## 2021-10-30 LAB — IGG: IgG (Immunoglobin G), Serum: 1404 mg/dL (ref 586–1602)

## 2021-10-30 LAB — HEPATIC FUNCTION PANEL
ALT: 23 IU/L (ref 0–32)
AST: 21 IU/L (ref 0–40)
Albumin: 4.1 g/dL (ref 3.9–4.9)
Alkaline Phosphatase: 67 IU/L (ref 44–121)
Bilirubin Total: 1.1 mg/dL (ref 0.0–1.2)
Bilirubin, Direct: 0.3 mg/dL (ref 0.00–0.40)
Total Protein: 7.1 g/dL (ref 6.0–8.5)

## 2021-10-30 LAB — CBC
Hematocrit: 41.5 % (ref 34.0–46.6)
Hemoglobin: 14.4 g/dL (ref 11.1–15.9)
MCH: 32.7 pg (ref 26.6–33.0)
MCHC: 34.7 g/dL (ref 31.5–35.7)
MCV: 94 fL (ref 79–97)
Platelets: 183 10*3/uL (ref 150–450)
RBC: 4.41 x10E6/uL (ref 3.77–5.28)
RDW: 12 % (ref 11.7–15.4)
WBC: 7.1 10*3/uL (ref 3.4–10.8)

## 2022-01-28 ENCOUNTER — Ambulatory Visit (INDEPENDENT_AMBULATORY_CARE_PROVIDER_SITE_OTHER): Payer: Medicare Other | Admitting: Family Medicine

## 2022-01-28 ENCOUNTER — Encounter: Payer: Self-pay | Admitting: Family Medicine

## 2022-01-28 VITALS — BP 139/77 | HR 96 | Temp 97.6°F | Ht 65.0 in | Wt 205.0 lb

## 2022-01-28 DIAGNOSIS — I152 Hypertension secondary to endocrine disorders: Secondary | ICD-10-CM

## 2022-01-28 DIAGNOSIS — E1159 Type 2 diabetes mellitus with other circulatory complications: Secondary | ICD-10-CM | POA: Diagnosis not present

## 2022-01-28 DIAGNOSIS — K760 Fatty (change of) liver, not elsewhere classified: Secondary | ICD-10-CM

## 2022-01-28 DIAGNOSIS — E1169 Type 2 diabetes mellitus with other specified complication: Secondary | ICD-10-CM | POA: Diagnosis not present

## 2022-01-28 DIAGNOSIS — E785 Hyperlipidemia, unspecified: Secondary | ICD-10-CM

## 2022-01-28 DIAGNOSIS — K754 Autoimmune hepatitis: Secondary | ICD-10-CM | POA: Diagnosis not present

## 2022-01-28 LAB — BAYER DCA HB A1C WAIVED: HB A1C (BAYER DCA - WAIVED): 6.8 % — ABNORMAL HIGH (ref 4.8–5.6)

## 2022-01-28 NOTE — Progress Notes (Unsigned)
Subjective: CC:DM. Autoimmune hepatitis PCP: [name], DO DGU:YQIH Taylor Leach is a 66 y.o. female presenting to clinic today for:  1. Type 2 Diabetes with hypertension, hyperlipidemia:  Patient reports compliance with her Ozempic.  She seems to be doing relatively well.  She has seen her liver specialist, no changes to current regimen but does need labs drawn.  She continues to have some intermittent nausea and vomiting so she will occasionally drop her dose back to 0.25 mg of the Ozempic weekly rather than 0.5.  She admits that this intermittent nausea and vomiting is related to what she has eaten that day and she tries to avoid things that are her guilty pleasures like candy corn. Last eye exam: Up-to-date Last foot exam: Up-to-date Last A1c:  Lab Results  Component Value Date   HGBA1C 5.9 (H) 10/29/2021   Nephropathy screen indicated?:  Up-to-date Last flu, zoster and/or pneumovax:  Immunization History  Administered Date(s) Administered   Hep A / Hep B 03/24/2008, 04/23/2008, 09/08/2008   Influenza, Seasonal, Injecte, Preservative Fre 01/10/2008   Influenza,inj,Quad PF,6+ Mos 05/18/2016   Influenza-Unspecified 02/02/2018   PFIZER(Purple Top)SARS-COV-2 Vaccination 11/08/2019, 11/29/2019   PNEUMOCOCCAL CONJUGATE-20 10/29/2021   Pneumococcal Polysaccharide-23 11/30/2017   Zoster Recombinat (Shingrix) 12/12/2018, 06/12/2019    ROS: no chest pain, shortness of breath, dizziness or falls  2.  Prolapsed bladder Patient is going to see a urogynecologist for what sounds like a prolapsed bladder.  She has a history of total hysterectomy.   ROS: Per HPI  Allergies  Allergen Reactions   Fenofibrate Micronized Other (See Comments)    Caused increased LFT's Caused increased LFT's   Glipizide     Hypoglycemia    Codeine    [place] [Dapagliflozin]     Bladder pains   Hydrocodone Nausea And Vomiting and Other (See Comments)    Makes body hot.     Hydrocodone-Acetaminophen Nausea And Vomiting   Janumet [Sitagliptin-Metformin Hcl]     Extremely bad gas    Past Medical History:  Diagnosis Date   Arthritis    Autoimmune hepatitis ([place])    Chest pain    Stress echo normal, October, 2012   Dyslipidemia    Ejection fraction    EF 55-60%, echo, October, 2012   GERD (gastroesophageal reflux disease)     Current Outpatient Medications:    atorvastatin (LIPITOR) 10 MG tablet, Take 1 tablet (10 mg total) by mouth daily., Disp: 90 tablet, Rfl: 3   blood glucose meter kit and supplies KIT, Dispense based on patient and insurance preference. Use up to four times daily as directed. (FOR ICD-9 250.00, 250.01). E11.9, Disp: 1 each, Rfl: 0   budesonide (ENTOCORT EC) 3 MG 24 hr capsule, Take 3 mg by mouth 2 (two) times daily., Disp: , Rfl:    cetirizine (ZYRTEC) 10 MG tablet, Take by mouth., Disp: , Rfl:    CONTOUR NEXT TEST test strip, CHECK BLOOD SUGAR UP TO 4 TIMES A DAY OR AS DIRECTED, Disp: 400 each, Rfl: 2   erythromycin ophthalmic ointment, as directed., Disp: , Rfl:    fluorouracil (EFUDEX) 5 % cream, Apply topically 2 (two) times daily., Disp: , Rfl:    fluticasone (FLONASE) 50 MCG/ACT nasal spray, Place 1 spray into both nostrils 2 (two) times daily as needed for allergies or rhinitis., Disp: 48 g, Rfl: 1   mycophenolate (MYFORTIC) 360 MG TBEC EC tablet, Take 360 mg by mouth 2 (two) times daily., Disp: , Rfl:  pantoprazole (PROTONIX) 40 MG tablet, Take 1 tablet (40 mg total) by mouth daily., Disp: 90 tablet, Rfl: 3   Semaglutide,0.25 or 0.5MG/DOS, (OZEMPIC, 0.25 OR 0.5 MG/DOSE,) 2 MG/3ML SOPN, Inject 0.5 mg into the skin every 7 (seven) days., Disp: 9 mL, Rfl: 3   triamcinolone cream (KENALOG) 0.1 %, Apply 1 application. topically 2 (two) times daily as needed (eczema flare for up to 7 days). To affected area on ear, Disp: 30 g, Rfl: 1 Social History   Socioeconomic History   Marital status: Married    Spouse name: Not on file    Number of children: Not on file   Years of education: Not on file   Highest education level: Not on file  Occupational History   Not on file  Tobacco Use   Smoking status: Former    Packs/day: 1.00    Types: Cigarettes   Smokeless tobacco: Never  Vaping Use   Vaping Use: Never used  Substance and Sexual Activity   Alcohol use: Yes    Alcohol/week: 1.0 standard drink of alcohol    Types: 1 Glasses of wine per week   Drug use: No   Sexual activity: Yes    Birth control/protection: Surgical  Other Topics Concern   Not on file  Social History Narrative   Not on file   Social Determinants of Health   Financial Resource Strain: Not on file  Food Insecurity: Not on file  Transportation Needs: Not on file  Physical Activity: Not on file  Stress: Not on file  Social Connections: Not on file  Intimate Partner Violence: Not on file   Family History  Problem Relation Age of Onset   Cancer Mother    Cancer Father    Lupus Daughter    Arthritis/Rheumatoid Daughter    Diabetes type II Daughter     Objective: Office vital signs reviewed. BP 139/77   Pulse 96   Temp 97.6 F (36.4 C) (Temporal)   Ht _0  (1.651 m)   Wt 205 lb (93 kg)   SpO2 97%   BMI 34.11 kg/m   Physical Examination:  General: Awake, alert, obese, well-nourished, No acute distress HEENT: sclera white, MMM Cardio: regular rate and rhythm, S1S2 heard, no murmurs appreciated Pulm: clear to auscultation bilaterally, no wheezes, rhonchi or rales; normal work of breathing on room air GI: Exam limited by obesity but seemingly soft, non-tender, non-distended, bowel sounds present x4, no hepatomegaly, no splenomegaly, no masses    Assessment/ Plan: 66 y.o. female   Controlled type 2 diabetes mellitus with other specified complication, without long-term current use of insulin ([facility]) - Plan: Bayer DCA Hb A1c Waived  Hypertension associated with diabetes ([place])  Hyperlipidemia associated with type 2 diabetes  mellitus ([facility])  Autoimmune hepatitis ([place]) - Plan: IGG, Hepatic Function Panel, CBC with Differential/Platelet  Nonalcoholic fatty liver disease without nonalcoholic steatohepatitis ([facility]) - Plan: Hepatic Function Panel, CBC with Differential/Platelet  Sugar still technically controlled but has quite a rise in her A1c to 6.8 today.  Definitely cut back on sugary foods.  We discussed that the intermittent abdominal discomfort she experiences is likely due to poor dietary choices.  Blood pressure controlled upon recheck.  No changes  Continue to work on diet, exercise to reduce progression of liver disease.  Obtain levels and we will CC to her hepatologist once available  No orders of the defined types were placed in this encounter.  No orders of the defined types were placed in this  encounter.    [name], DO [place] [phone]

## 2022-01-31 ENCOUNTER — Encounter: Payer: Self-pay | Admitting: Family Medicine

## 2022-02-01 LAB — CBC WITH DIFFERENTIAL/PLATELET
Basophils Absolute: 0 10*3/uL (ref 0.0–0.2)
Basos: 1 %
EOS (ABSOLUTE): 0.1 10*3/uL (ref 0.0–0.4)
Eos: 2 %
Hematocrit: 42.3 % (ref 34.0–46.6)
Hemoglobin: 14.7 g/dL (ref 11.1–15.9)
Immature Grans (Abs): 0 10*3/uL (ref 0.0–0.1)
Immature Granulocytes: 1 %
Lymphocytes Absolute: 1.2 10*3/uL (ref 0.7–3.1)
Lymphs: 20 %
MCH: 32 pg (ref 26.6–33.0)
MCHC: 34.8 g/dL (ref 31.5–35.7)
MCV: 92 fL (ref 79–97)
Monocytes Absolute: 0.8 10*3/uL (ref 0.1–0.9)
Monocytes: 13 %
Neutrophils Absolute: 3.7 10*3/uL (ref 1.4–7.0)
Neutrophils: 63 %
Platelets: 188 10*3/uL (ref 150–450)
RBC: 4.6 x10E6/uL (ref 3.77–5.28)
RDW: 12 % (ref 11.7–15.4)
WBC: 5.8 10*3/uL (ref 3.4–10.8)

## 2022-02-01 LAB — HEPATIC FUNCTION PANEL
ALT: 30 IU/L (ref 0–32)
AST: 26 IU/L (ref 0–40)
Albumin: 4.1 g/dL (ref 3.9–4.9)
Alkaline Phosphatase: 76 IU/L (ref 44–121)
Bilirubin Total: 0.4 mg/dL (ref 0.0–1.2)
Bilirubin, Direct: 0.11 mg/dL (ref 0.00–0.40)
Total Protein: 7.5 g/dL (ref 6.0–8.5)

## 2022-02-01 LAB — IGG: IgG (Immunoglobin G), Serum: 1735 mg/dL — ABNORMAL HIGH (ref 586–1602)

## 2022-02-11 ENCOUNTER — Encounter: Payer: Self-pay | Admitting: Obstetrics and Gynecology

## 2022-02-11 ENCOUNTER — Ambulatory Visit (INDEPENDENT_AMBULATORY_CARE_PROVIDER_SITE_OTHER): Payer: Medicare Other | Admitting: Obstetrics and Gynecology

## 2022-02-11 ENCOUNTER — Other Ambulatory Visit (HOSPITAL_COMMUNITY)
Admission: RE | Admit: 2022-02-11 | Discharge: 2022-02-11 | Disposition: A | Discharge status: Home / Self Care | Payer: Medicare Other | Attending: Obstetrics and Gynecology | Admitting: Obstetrics and Gynecology

## 2022-02-11 VITALS — BP 144/83 | HR 71 | Ht 63.5 in | Wt 204.0 lb

## 2022-02-11 DIAGNOSIS — N3941 Urge incontinence: Secondary | ICD-10-CM

## 2022-02-11 DIAGNOSIS — R350 Frequency of micturition: Secondary | ICD-10-CM | POA: Diagnosis not present

## 2022-02-11 DIAGNOSIS — N993 Prolapse of vaginal vault after hysterectomy: Secondary | ICD-10-CM

## 2022-02-11 DIAGNOSIS — N8110 Cystocele, unspecified: Secondary | ICD-10-CM

## 2022-02-11 DIAGNOSIS — R82998 Other abnormal findings in urine: Secondary | ICD-10-CM

## 2022-02-11 DIAGNOSIS — N393 Stress incontinence (female) (male): Secondary | ICD-10-CM

## 2022-02-11 LAB — POCT URINALYSIS DIPSTICK
Bilirubin, UA: NEGATIVE
Blood, UA: NEGATIVE
Glucose, UA: NEGATIVE
Ketones, UA: NEGATIVE
Nitrite, UA: NEGATIVE
Protein, UA: NEGATIVE
Spec Grav, UA: 1.025 (ref 1.010–1.025)
Urobilinogen, UA: 0.2 E.U./dL
pH, UA: 5.5 (ref 5.0–8.0)

## 2022-02-11 NOTE — Patient Instructions (Addendum)
Today we talked about ways to manage bladder urgency such as altering your diet to avoid irritative beverages and foods (bladder diet) as well as attempting to decrease stress and other exacerbating factors.  Try to decrease drinking 2-3 hours prior to bedtime.   The Most Bothersome Foods* The Least Bothersome Foods*  Coffee - Regular & Decaf Tea - caffeinated Carbonated beverages - cola, non-colas, diet & caffeine-free Alcohols - Beer, Red Wine, White Wine, [address] - Grapefruit, [place], Orange, [employer] - Cranberry, Grapefruit, Orange, Pineapple Vegetables - Tomato & Tomato Products Flavor Enhancers - Hot peppers, Spicy foods, Chili, Horseradish, Vinegar, Monosodium glutamate (MSG) Artificial Sweeteners - NutraSweet, Sweet 'N Low, Equal (sweetener), Saccharin Ethnic foods - [place], [place], [place] food [employer] - low-fat & whole Fruits - Bananas, Blueberries, Honeydew melon, Pears, Raisins, Watermelon Vegetables - Broccoli, [address] Sprouts, [employer], Carrots, Cauliflower, [place], Cucumber, Mushrooms, Peas, Radishes, Squash, Zucchini, White potatoes, Sweet potatoes & yams Poultry - Chicken, Eggs, [place], [employer] - Beef, Diplomatic Services operational officer, Lamb Seafood - Shrimp, [place] fish, Salmon Grains - Oat, Rice Snacks - Pretzels, Popcorn  *Lenward Chancellor et al. Diet and its role in interstitial cystitis/bladder pain syndrome (IC/BPS) and comorbid conditions. [employer]. [employer]JU Int. 2012 Jan 11.   URODYNAMICS (UDS) TEST INFORMATION  IMPORTANT: Please try to arrive with a comfortably full bladder!   What is UDS? Urodynamics is a bladder test used to evaluate how your bladder and urethra (tube you urinate out of) work to help find out the cause of your bladder symptoms and evaluate your bladder function in order to make the best treatment plan for you.   What to expect? A nurse will perform the test and will be with you during the entire exam. First we will have to empty your bladder on a  special toilet.  After you have emptied your bladder, very small catheters (plastic tubing) will be placed into your bladder and into your vagina (or rectum). These special small catheters measure pressure to help measure your bladder function.  Your bladder will be gently filled with water and you will be asked to cough and strain at several different points during the test.   You will then be asked to empty your bladder in the special toilet with the catheters in place. Most patients can urinate (pee) easily with the catheters in place since the catheters are so small. In total this procedure lasts about 45 minutes to 1 hour.  After your test is completed, you will return (or possibly be seen the same day) to review the results, talk about treatment options and make a plan moving forward.

## 2022-02-11 NOTE — Progress Notes (Signed)
[facility] New Patient Evaluation and Consultation  Referring Provider: Tiajuana Amass* PCP: [name], DO Date of Service: 02/11/2022  SUBJECTIVE Chief Complaint: New Patient (Initial Visit) Taylor Leach is a 66 y.o. female here for a consult on prolpase and urinary leakage./)  History of Present Illness: Taylor Leach is a 66 y.o. White or Caucasian female seen in consultation at the request of Dr. Addison Bailey for evaluation of prolapse.    Review of records from Dr Addison Bailey significant for: Has a feeling of bulge in her vagina. Cystocele noted on exam. Has a history of hysterectomy with rectocele repair.   Last hgb A1c on 01/28/22 was 6.8%  Urinary Symptoms: Leaks urine with cough/ sneeze, laughing, going from sitting to standing, with a full bladder, and with movement to the bathroom Leaks 2-3 time(s) per day. Happens more at night or with movement. Twice she has lost full control when her bladder was full.  Pad use: 2 liners/ mini-pads per day.   She is bothered by her UI symptoms.  Day time voids 5-6.  Nocturia: 1-2 times per night to void. Voiding dysfunction: she empties her bladder well.  does not use a catheter to empty bladder.  When urinating, she feels a weak stream, difficulty starting urine stream, dribbling after finishing, and the need to urinate multiple times in a row Drinks: 2 cups coffee in AM, 2 cups decaf coffee in afternoon, about 48 oz water per day Drinks some before she goes to bed, sometime will drink the decaf coffee later before bed as well.   UTIs: 1 UTI's in the last year.   Denies history of blood in urine and kidney or bladder stones  Pelvic Organ Prolapse Symptoms:                  She Admits to a feeling of a bulge the vaginal area. It has been present for 1 years.  She Denies seeing a bulge.  This bulge is bothersome. Has a lot of pressure.  Has a history of rectocele repair in 2014 with Dr Barrie Dunker in [place].   Bowel  Symptom: Bowel movements: 1-2 time(s) per day Stool consistency: hard Straining: yes.  Splinting: no.  Incomplete evacuation: yes.  She Denies accidental bowel leakage / fecal incontinence Bowel regimen: stool softener  Sexual Function Sexually active: yes.  Sexual orientation:  heterosexual Pain with sex: Yes, has discomfort due to prolapse  Pelvic Pain Denies pelvic pain   Past Medical History:  Past Medical History:  Diagnosis Date   Arthritis    Autoimmune hepatitis ([place])    Chest pain    Stress echo normal, October, 2012   Diabetes mellitus without complication ([place])    Dyslipidemia    Ejection fraction    EF 55-60%, echo, October, 2012   GERD (gastroesophageal reflux disease)      Past Surgical History:   Past Surgical History:  Procedure Laterality Date   CHOLECYSTECTOMY     EYE SURGERY Bilateral 07/15/2021   NASAL SINUS SURGERY     [facility]   VAGINAL HYSTERECTOMY       Past OB/GYN History: OB History  Gravida Para Term Preterm AB Living  _0 SAB IAB Ectopic Multiple Live Births          2    # Outcome Date GA Lbr Len/2nd Weight Sex Delivery Anes PTL  Lv  2 Term      Vag-Spont     1 Term      Vag-Spont      S/p hysterectomy   Medications: She has a current medication list which includes the following prescription(s): atorvastatin, blood glucose meter kit and supplies, budesonide, cetirizine, contour next test, erythromycin, fluticasone, mycophenolate, pantoprazole, ozempic (0.25 or 0.5 mg/dose), and triamcinolone cream.   Allergies: Patient is allergic to fenofibrate micronized, glipizide, codeine, farxiga [dapagliflozin], hydrocodone, hydrocodone-acetaminophen, and janumet [sitagliptin-metformin hcl].   Social History:  Social History   Tobacco Use   Smoking status: Former    Packs/day: 1.00    Types: Cigarettes   Smokeless tobacco: Never  Vaping Use   Vaping Use: Never used   Substance Use Topics   Alcohol use: Not Currently   Drug use: No    Relationship status: married She lives with husband.   She is not employed. Regular exercise: No History of abuse: No  Family History:   Family History  Problem Relation Age of Onset   Cancer Mother    Cancer Father    Lupus Daughter    Arthritis/Rheumatoid Daughter    Diabetes type II Daughter      Review of Systems: Review of Systems  Constitutional:  Negative for fever, malaise/fatigue and weight loss.  Respiratory:  Negative for cough, shortness of breath and wheezing.   Cardiovascular:  Negative for chest pain, palpitations and leg swelling.  Gastrointestinal:  Negative for abdominal pain and blood in stool.  Genitourinary:  Negative for dysuria.  Musculoskeletal:  Negative for myalgias.  Skin:  Negative for rash.  Neurological:  Negative for dizziness and headaches.  Endo/Heme/Allergies:  Does not bruise/bleed easily.  Psychiatric/Behavioral:  Negative for depression. The patient is not nervous/anxious.      OBJECTIVE Physical Exam: Vitals:   02/11/22 1045  BP: (!) 144/83  Pulse: 71  Weight: 204 lb (92.5 kg)  Height: 5' 3.5" (1.613 m)    Physical Exam Constitutional:      General: She is not in acute distress. Pulmonary:     Effort: Pulmonary effort is normal.  Abdominal:     General: There is no distension.     Palpations: Abdomen is soft.     Tenderness: There is no abdominal tenderness. There is no rebound.  Musculoskeletal:        General: No swelling. Normal range of motion.  Skin:    General: Skin is warm and dry.     Findings: No rash.  Neurological:     Mental Status: She is alert and oriented to person, place, and time.  Psychiatric:        Mood and Affect: Mood normal.        Behavior: Behavior normal.      GU / Detailed Urogynecologic Evaluation:  Pelvic Exam: Normal external female genitalia; Bartholin's and Skene's glands normal in appearance; urethral meatus  normal in appearance, no urethral masses or discharge.   CST: negative  s/p hysterectomy: Speculum exam reveals normal vaginal mucosa with  atrophy and normal vaginal cuff.  Adnexa no mass, fullness, tenderness.     Pelvic floor strength II/V  Pelvic floor musculature: Right levator non-tender, Right obturator non-tender, Left levator non-tender, Left obturator non-tender  POP-Q:   POP-Q  0  Aa   0                                           Ba  -4                                              C   3.5                                            Gh  4                                            Pb  6                                            tvl   -2.5                                            Ap  -2.5                                            Bp                                                 D      Rectal Exam:  Normal external rectum  Post-Void Residual (PVR) by Bladder Scan: In order to evaluate bladder emptying, we discussed obtaining a postvoid residual and she agreed to this procedure.  Procedure: The ultrasound unit was placed on the patient's abdomen in the suprapubic region after the patient had voided. A PVR of 64 ml was obtained by bladder scan.  Laboratory Results: POC urine: small leukocytes   ASSESSMENT AND PLAN Taylor Leach is a 66 y.o. with:  1. Prolapse of anterior vaginal wall   2. Vaginal vault prolapse after hysterectomy   3. SUI (stress urinary incontinence, female)   4. Urge incontinence   5. Urinary frequency   6. Leukocytes in urine    Stage II anterior, Stage I posterior, Stage I apical prolapse - For treatment of pelvic organ prolapse, we discussed options for management including expectant management, conservative management, and surgical management, such as Kegels, a pessary, pelvic floor physical therapy, and specific surgical procedures. - We discussed two options for prolapse repair:  1)  vaginal repair without mesh - Pros - safer, no mesh complications - Cons - not as strong as mesh repair, higher risk of recurrence  2) laparoscopic repair with mesh - Pros - stronger, better long-term success - Cons - risks of mesh  implant (erosion into vagina or bladder, adhering to the rectum, pain) - these risks are lower than with a vaginal mesh but still exist -She prefers the stronger repair, so will plan to proceed with Robotic sacrocolpopexy.    2. SUI - For treatment of stress urinary incontinence,  non-surgical options include expectant management, weight loss, physical therapy, as well as a pessary.  Surgical options include a midurethral sling, Burch urethropexy, and transurethral injection of a bulking agent. - She is interested in a sling at the time of prolapse repair - due to mixed incontinence and urinary hesitancy, will proceed with urodynamic testing.   3. Urge incontinence - not as bothersome to her. She will work on reducing bladder irritants (coffee) and avoid drinking before bedtime.   4. Leukocytes in urine - will send urine for culture to rule out infection  Return for urodynamics  Jaquita Folds, MD

## 2022-02-13 LAB — URINE CULTURE: Culture: 50000 — AB

## 2022-02-14 MED ORDER — SULFAMETHOXAZOLE-TRIMETHOPRIM 800-160 MG PO TABS: 1.0000 | ORAL_TABLET | Freq: Two times a day (BID) | ORAL | 0 refills | Status: AC

## 2022-02-14 NOTE — Addendum Note (Signed)
Addended by: Marguerita Beards on: 02/14/2022 05:37 PM   Modules accepted: Orders

## 2022-02-21 ENCOUNTER — Ambulatory Visit (INDEPENDENT_AMBULATORY_CARE_PROVIDER_SITE_OTHER): Payer: Medicare Other | Admitting: Nurse Practitioner

## 2022-02-21 ENCOUNTER — Encounter: Payer: Self-pay | Admitting: Nurse Practitioner

## 2022-02-21 VITALS — BP 141/80 | HR 82 | Temp 98.8°F | Ht 63.0 in | Wt 202.0 lb

## 2022-02-21 DIAGNOSIS — H9203 Otalgia, bilateral: Secondary | ICD-10-CM | POA: Diagnosis not present

## 2022-02-21 DIAGNOSIS — J029 Acute pharyngitis, unspecified: Secondary | ICD-10-CM | POA: Diagnosis not present

## 2022-02-21 LAB — RAPID STREP SCREEN (MED CTR MEBANE ONLY): Strep Gp A Ag, IA W/Reflex: NEGATIVE

## 2022-02-21 LAB — CULTURE, GROUP A STREP

## 2022-02-21 MED ORDER — AMOXICILLIN-POT CLAVULANATE 875-125 MG PO TABS: 1.0000 | ORAL_TABLET | Freq: Two times a day (BID) | ORAL | 0 refills | Status: DC

## 2022-02-21 NOTE — Patient Instructions (Signed)
Sore Throat When you have a sore throat, your throat may feel: Tender. Burning. Irritated. Scratchy. Painful when you swallow. Painful when you talk. Many things can cause a sore throat, such as: An infection. Allergies. Dry air. Smoke or pollution. Radiation treatment for cancer. Gastroesophageal reflux disease (GERD). A tumor. A sore throat can be the first sign of another sickness. It can happen with other problems, like: Coughing. Sneezing. Fever. Swelling of the glands in the neck. Most sore throats go away without treatment. Follow these instructions at home:     Medicines Take over-the-counter and prescription medicines only as told by your doctor. Children often get sore throats. Do not give your child aspirin. Use throat sprays to soothe your throat as told by your health care provider. Managing pain To help with pain: Sip warm liquids, such as broth, herbal tea, or warm water. Eat or drink cold or frozen liquids, such as frozen ice pops. Rinse your mouth (gargle) with a salt water mixture 3-4 times a day or as needed. To make salt water, dissolve -1 tsp (3-6 g) of salt in 1 cup (237 mL) of warm water. Do not swallow this mixture. Suck on hard candy or throat lozenges. Put a cool-mist humidifier in your bedroom at night. Sit in the bathroom with the door closed for 5-10 minutes while you run hot water in the shower. General instructions Do not smoke or use any products that contain nicotine or tobacco. If you need help quitting, ask your doctor. Get plenty of rest. Drink enough fluid to keep your pee (urine) pale yellow. Wash your hands often for at least 20 seconds with soap and water. If soap and water are not available, use hand sanitizer. Contact a doctor if: You have a fever for more than 2-3 days. You keep having symptoms for more than 2-3 days. Your throat does not get better in 7 days. You have a fever and your symptoms suddenly get worse. Your  child who is 3 months to 3 years old has a temperature of 102.2F (39C) or higher. Get help right away if: You have trouble breathing. You cannot swallow fluids, soft foods, or your spit. You have swelling in your throat or neck that gets worse. You feel like you may vomit (nauseous) and this feeling lasts a long time. You cannot stop vomiting. These symptoms may be an emergency. Get help right away. Call your local emergency services (911 in the [place] Do not wait to see if the symptoms will go away. Do not drive yourself to the hospital. Summary A sore throat is a painful, burning, irritated, or scratchy throat. Many things can cause a sore throat. Take over-the-counter medicines only as told by your doctor. Get plenty of rest. Drink enough fluid to keep your pee (urine) pale yellow. Contact a doctor if your symptoms get worse or your sore throat does not get better within 7 days. This information is not intended to replace advice given to you by your health care provider. Make sure you discuss any questions you have with your health care provider. Document Revised: 06/17/2020 Document Reviewed: 06/17/2020 Elsevier Patient Education  2023 Elsevier Inc[employer]  

## 2022-02-21 NOTE — Progress Notes (Signed)
Acute Office Visit  Subjective:     Patient ID: Taylor Leach, female    DOB: 08-Jan-1956, 66 y.o.   MRN: [id number]  Chief Complaint  Patient presents with   Sore Throat    Started saturday   Nasal Congestion    Sore Throat  This is a new problem. Episode onset: In the past 3 days. The problem has been gradually worsening. Neither side of throat is experiencing more pain than the other. There has been no fever. Associated symptoms include congestion, ear pain and headaches. Pertinent negatives include no abdominal pain, coughing or vomiting. She has had exposure to strep. She has tried nothing for the symptoms.  Otalgia  There is pain in both ears. This is a new problem. The current episode started yesterday. The problem occurs constantly. The problem has been unchanged. There has been no fever. The pain is moderate. Associated symptoms include headaches and a sore throat. Pertinent negatives include no abdominal pain, coughing, rash or vomiting. She has tried nothing for the symptoms.     Review of Systems  Constitutional:  Positive for chills. Negative for fever and malaise/fatigue.  HENT:  Positive for congestion, ear pain and sore throat. Negative for sinus pain.   Eyes: Negative.   Respiratory:  Negative for cough.   Cardiovascular: Negative.   Gastrointestinal:  Negative for abdominal pain, nausea and vomiting.  Skin: Negative.  Negative for itching and rash.  Neurological:  Positive for headaches.  All other systems reviewed and are negative.       Objective:    BP (!) 141/80   Pulse 82   Temp 98.8 F (37.1 C)   Ht 5\' 3"  (1.6 m)   Wt 202 lb (91.6 kg)   SpO2 97%   BMI 35.78 kg/m  BP Readings from Last 3 Encounters:  02/21/22 (!) 141/80  02/11/22 (!) 144/83  01/28/22 139/77      Physical Exam Vitals reviewed.  Constitutional:      Appearance: Normal appearance. She is obese.  HENT:     Head: Normocephalic.     Right Ear: External ear normal. Tympanic  membrane is erythematous.     Left Ear: External ear normal. Tympanic membrane is erythematous.     Mouth/Throat:     Mouth: Mucous membranes are moist.     Pharynx: Uvula midline. Pharyngeal swelling and posterior oropharyngeal erythema present.  Eyes:     Conjunctiva/sclera: Conjunctivae normal.  Cardiovascular:     Rate and Rhythm: Normal rate and regular rhythm.     Pulses: Normal pulses.     Heart sounds: Normal heart sounds.  Pulmonary:     Effort: Pulmonary effort is normal.     Breath sounds: Normal breath sounds.  Abdominal:     General: Bowel sounds are normal.  Skin:    General: Skin is warm.     Findings: No erythema or rash.  Neurological:     General: No focal deficit present.     Mental Status: She is alert and oriented to person, place, and time.  Psychiatric:        Mood and Affect: Mood normal.        Behavior: Behavior normal.     No results found for any visits on 02/21/22.      Assessment & Plan:  Patient presents with sore throat, ear pain, headache, and chills in the last 3 days.  Patient was around sick school children.  At home COVID-19 test negative.  I will prophylactically treat patient with Augmentin as her tonsils are erythematous, with exudate and tender. Advised patient to increase hydration, take meds as prescribed - Use a cool mist humidifier  -Use saline nose sprays frequently -For fever or aches or pains- take Tylenol or ibuprofen. -At home COVID-19 test negative -Completed strep swab results pending. -If symptoms do not improve, she may need to be COVID tested to rule this out Follow up with worsening unresolved symptoms  Problem List Items Addressed This Visit   None Visit Diagnoses     Sore throat    -  Primary   Relevant Medications   amoxicillin-clavulanate (AUGMENTIN) 875-125 MG tablet   Other Relevant Orders   Rapid Strep Screen ([facility] ONLY)   Otalgia of both ears           Meds ordered this encounter   Medications   DISCONTD: amoxicillin-clavulanate (AUGMENTIN) 875-125 MG tablet    Sig: Take 1 tablet by mouth 2 (two) times daily.    Dispense:  14 tablet    Refill:  0    Order Specific Question:   Supervising Provider    Answer:   Mechele Claude [[id number]]   amoxicillin-clavulanate (AUGMENTIN) 875-125 MG tablet    Sig: Take 1 tablet by mouth 2 (two) times daily.    Dispense:  20 tablet    Refill:  0    Order Specific Question:   Supervising Provider    Answer:   Mechele Claude [[id number]]    Return if symptoms worsen or fail to improve.  Daryll Drown, NP

## 2022-02-28 NOTE — Progress Notes (Unsigned)
[facility] Urodynamics Procedure  Referring Physician: Raliegh Ip, DO Date of Procedure: 03/02/2022  Taylor Leach is a 66 y.o. female who presents for urodynamic evaluation. Indication(s) for study: {UDS indications:24784}  Vital Signs: There were no vitals taken for this visit.  Laboratory Results: A {clean catch/ GEXB:28413} urine specimen revealed:  POC urine:    Voiding Diary: The patient voided {NUMBERS; 1-20:18551} times per day. Voided volumes ranged from *** mL to *** mL, with an average of *** mL.  Intervals between voids ranged from *** hr to ***hr with an average interval between voids of *** hr.  Intake per day ranged from ***mL to ***mL.  Output ranged from ***mL to ***mL.  She had *** incontinence episodes per day (*** UUI, *** SUI) and *** episodes of nocturia.  She drinks: *** oz of caffeinated beverages, *** oz of juice, and *** oz of alcohol per day.  Procedure Timeout:  The correct patient was verified and the correct procedure was verified. The patient was in the correct position and safety precautions were reviewed based on at the patient's history.  Urodynamic Procedure A 59F dual lumen urodynamics catheter was placed under sterile conditions into the patient's bladder. A 59F catheter was placed into the {vagina/ rectum:24786} in order to measure abdominal pressure. EMG patches were placed in the appropriate position.  All connections were confirmed and calibrations/adjusted made. Saline was instilled into the bladder through the dual lumen catheters.  Cough/valsalva pressures were measured periodically during filling.  Patient was allowed to void.  The bladder was then emptied of its residual.  UROFLOW: Revealed a Qmax of *** mL/sec.  She voided *** mL and had a residual of *** mL.  It was a {uroflow:24789} pattern and represented normal habits ***though interpretation limited due to low voided volume.***  CMG: This was performed with sterile  water in the sitting position at a fill rate of 30*** mL/min.    First sensation of fullness was *** mLs,  First urge was *** mLs,  Strong urge was *** mLs and  Capacity was *** mLs  Stress incontinence {WAS/WAS NOT:[id number]::"was not"} demonstrated {highest/ lowest:24787} {Desc; negative/positive:13464} CLPP was *** cmH20 at *** ml. {highest/ lowest:24787} {Desc; negative/positive:13464} VLPP was *** cmH20at *** ml. {highest/ lowest:24787} {Desc; negative/positive:13464} Barrier CLPP was *** cmH20 at *** ml. {highest/ lowest:24787} {Desc; negative/positive:13464} Barrier VLPP was *** cmH20 at *** ml.  Detrusor function was {Desc; normal/underactive/overactive:13463}, {With/no:32556} phasic contractions seen.  The first occurred at *** mL to *** cm of water and {WAS/WAS NOT:[id number]::"was not"} associated with {urge/ KGMW:10272}.  Compliance:  ***. End fill detrusor pressure was ***cmH20.  Calculated compliance was ***[id number]/[id number]  UPP: MUCP {With/without:5700} barrier reduction was *** cm of water.    MICTURITION STUDY: Voiding was performed {reduction:24791} in the sitting position.  Pdet at Qmax was *** cm of water.  Qmax was *** mL/sec.  It was a {uroflow:24789} pattern.  She voided *** mL and had a residual of *** mL.  It was a volitional void, sustained detrusor contraction was {DESC; PRESENT/NOT PRESENT:21021351} and abdominal straining was {DESC; PRESENT/NOT PRESENT:21021351}  EMG: This was performed with patches.  She had voluntary contractions, recruitment with fill was {DESC; PRESENT/NOT PRESENT:21021351} and urethral sphincter was {relaxed:24792} with void.  The details of the procedure with the study tracings have been scanned into EPIC.   Urodynamic Impression:  1. Sensation was {DESC; NORMAL/REDUCED/ABSENT/INCREASED:23133}; capacity was {DESC; NORMAL/REDUCED/ABSENT/INCREASED:23133} 2. Stress Incontinence {WAS/WAS NOT:[id number]::"was not"} demonstrated at {ISD:24793}  pressures; 3.  Detrusor Overactivity {WAS/WAS [phone]::"was not"} demonstrated {With-without:32421} leakage. 4. Emptying was {dysfunctional:24794} with a {elevated:24795} PVR, a sustained detrusor contraction {DESC; PRESENT/NOT PRESENT:21021351},  abdominal straining {DESC; PRESENT/NOT PRESENT:21021351}, {dyssynergic:24796} urethral sphincter activity on EMG.  Plan: - The patient will follow up  to discuss the findings and treatment options.

## 2022-03-02 ENCOUNTER — Ambulatory Visit (INDEPENDENT_AMBULATORY_CARE_PROVIDER_SITE_OTHER): Payer: Medicare Other | Admitting: Obstetrics and Gynecology

## 2022-03-02 ENCOUNTER — Encounter: Payer: Self-pay | Admitting: Obstetrics and Gynecology

## 2022-03-02 ENCOUNTER — Ambulatory Visit: Payer: Medicare Other | Admitting: Obstetrics and Gynecology

## 2022-03-02 VITALS — BP 159/79 | HR 81 | Ht 63.0 in | Wt 202.0 lb

## 2022-03-02 DIAGNOSIS — N393 Stress incontinence (female) (male): Secondary | ICD-10-CM | POA: Diagnosis not present

## 2022-03-02 DIAGNOSIS — N3941 Urge incontinence: Secondary | ICD-10-CM

## 2022-03-02 DIAGNOSIS — R350 Frequency of micturition: Secondary | ICD-10-CM

## 2022-03-02 DIAGNOSIS — R030 Elevated blood-pressure reading, without diagnosis of hypertension: Secondary | ICD-10-CM

## 2022-03-02 LAB — POCT URINALYSIS DIPSTICK
Bilirubin, UA: NEGATIVE
Blood, UA: NEGATIVE
Glucose, UA: NEGATIVE
Ketones, UA: NEGATIVE
Leukocytes, UA: NEGATIVE
Nitrite, UA: NEGATIVE
Protein, UA: NEGATIVE
Spec Grav, UA: 1.02 (ref 1.010–1.025)
Urobilinogen, UA: 0.2 E.U./dL
pH, UA: 6 (ref 5.0–8.0)

## 2022-03-02 NOTE — Patient Instructions (Signed)

## 2022-04-01 ENCOUNTER — Ambulatory Visit: Payer: Medicare Other | Admitting: Nurse Practitioner

## 2022-04-06 ENCOUNTER — Encounter: Payer: Self-pay | Admitting: Obstetrics and Gynecology

## 2022-04-06 ENCOUNTER — Ambulatory Visit (INDEPENDENT_AMBULATORY_CARE_PROVIDER_SITE_OTHER): Payer: Medicare Other | Admitting: Obstetrics and Gynecology

## 2022-04-06 VITALS — BP 156/85 | HR 75

## 2022-04-06 DIAGNOSIS — N993 Prolapse of vaginal vault after hysterectomy: Secondary | ICD-10-CM | POA: Diagnosis not present

## 2022-04-06 DIAGNOSIS — N8110 Cystocele, unspecified: Secondary | ICD-10-CM | POA: Diagnosis not present

## 2022-04-06 DIAGNOSIS — N393 Stress incontinence (female) (male): Secondary | ICD-10-CM

## 2022-04-06 DIAGNOSIS — N3941 Urge incontinence: Secondary | ICD-10-CM

## 2022-04-06 DIAGNOSIS — Z01818 Encounter for other preprocedural examination: Secondary | ICD-10-CM

## 2022-04-06 MED ORDER — TRAMADOL HCL 50 MG PO TABS: 50.0000 mg | ORAL_TABLET | Freq: Three times a day (TID) | ORAL | 0 refills | Status: AC | PRN

## 2022-04-06 MED ORDER — IBUPROFEN 600 MG PO TABS: 600.0000 mg | ORAL_TABLET | Freq: Four times a day (QID) | ORAL | 0 refills | Status: DC | PRN

## 2022-04-06 MED ORDER — POLYETHYLENE GLYCOL 3350 17 GM/SCOOP PO POWD: 17.0000 g | Freq: Every day | ORAL | 0 refills | Status: DC

## 2022-04-06 MED ORDER — ACETAMINOPHEN 500 MG PO TABS: 500.0000 mg | ORAL_TABLET | Freq: Four times a day (QID) | ORAL | 0 refills | Status: DC | PRN

## 2022-04-06 NOTE — Progress Notes (Signed)
[facility] Pre-Operative visit  Subjective Chief Complaint: Taylor Leach presents for a preoperative encounter.   History of Present Illness: Taylor Leach is a 67 y.o. female who presents for preoperative visit.  She is scheduled to undergo Robotic assisted sacrocolpopexy, cystoscopy on 04/20/21.  Her symptoms include vaginal bulge and incontinnece, and she was was found to have Stage II anterior, Stage I posterior, Stage I apical prolapse.   Urodynamic Impression:  1. Sensation was increased; capacity was normal 2. Stress Incontinence was demonstrated at normal pressures; 3. Detrusor Overactivity was not demonstrated  4. Emptying was a normal but prolonged uroflow pattern with a elevated PVR (452m), a sustained detrusor contraction present,  abdominal straining not present, normal urethral sphincter activity on EMG.  Past Medical History:  Diagnosis Date   Arthritis    Autoimmune hepatitis ([place]    Chest pain    Stress echo normal, October, 2012   Diabetes mellitus without complication ([place]    Dyslipidemia    Ejection fraction    EF 55-60%, echo, October, 2012   GERD (gastroesophageal reflux disease)      Past Surgical History:  Procedure Laterality Date   CHOLECYSTECTOMY     EYE SURGERY Bilateral 07/15/2021   NASAL SINUS SURGERY     [facility]  [facility]  VAGINAL HYSTERECTOMY      is allergic to fenofibrate micronized, glipizide, codeine, farxiga [dapagliflozin], hydrocodone, hydrocodone-acetaminophen, and janumet [sitagliptin-metformin hcl].   Family History  Problem Relation Age of Onset   Cancer Mother    Cancer Father    Lupus Daughter    Arthritis/Rheumatoid Daughter    Diabetes type II Daughter     Social History   Tobacco Use   Smoking status: Former    Packs/day: 1.00    Types: Cigarettes   Smokeless tobacco: Never  Vaping Use   Vaping Use: Never used  Substance Use Topics   Alcohol use: Not  Currently   Drug use: No     Review of Systems was negative for a full 10 system review except as noted in the History of Present Illness.   Current Outpatient Medications:    atorvastatin (LIPITOR) 10 MG tablet, Take 1 tablet (10 mg total) by mouth daily., Disp: 90 tablet, Rfl: 3   blood glucose meter kit and supplies KIT, Dispense based on patient and insurance preference. Use up to four times daily as directed. (FOR ICD-9 250.00, 250.01). E11.9, Disp: 1 each, Rfl: 0   budesonide (ENTOCORT EC) 3 MG 24 hr capsule, Take 3 mg by mouth 2 (two) times daily., Disp: , Rfl:    cetirizine (ZYRTEC) 10 MG tablet, Take by mouth., Disp: , Rfl:    CONTOUR NEXT TEST test strip, CHECK BLOOD SUGAR UP TO 4 TIMES A DAY OR AS DIRECTED, Disp: 400 each, Rfl: 2   erythromycin ophthalmic ointment, as directed., Disp: , Rfl:    fluticasone (FLONASE) 50 MCG/ACT nasal spray, Place 1 spray into both nostrils 2 (two) times daily as needed for allergies or rhinitis., Disp: 48 g, Rfl: 1   mycophenolate (MYFORTIC) 360 MG TBEC EC tablet, Take 360 mg by mouth 2 (two) times daily., Disp: , Rfl:    pantoprazole (PROTONIX) 40 MG tablet, Take 1 tablet (40 mg total) by mouth daily., Disp: 90 tablet, Rfl: 3   Semaglutide,0.25 or 0.5MG/DOS, (OZEMPIC, 0.25 OR 0.5 MG/DOSE,) 2 MG/3ML SOPN, Inject 0.5 mg into the skin every 7 (seven) days.,  Disp: 9 mL, Rfl: 3   triamcinolone cream (KENALOG) 0.1 %, Apply 1 application. topically 2 (two) times daily as needed (eczema flare for up to 7 days). To affected area on ear, Disp: 30 g, Rfl: 1   Objective Vitals:   04/06/22 0901 04/06/22 0904  BP: (!) 168/93 (!) 156/85  Pulse: 78 75    Gen: NAD CV: S1 S2 RRR Lungs: Clear to auscultation bilaterally Abd: soft, nontender   Previous Pelvic Exam showed: POP-Q   0                                            Aa   0                                           Ba   -4                                              C    3.5                                             Gh   4                                            Pb   6                                            tvl    -2.5                                            Ap   -2.5                                            Bp                                                  D       Assessment/ Plan  Assessment: The patient is a 67 y.o. year old scheduled to undergo Robotic assisted sacrocolpopexy, cystoscopy. Verbal consent was obtained for these procedures.  Plan: General Surgical Consent: The patient has previously been counseled on alternative treatments, and the decision by the patient and provider was to proceed with the procedure listed above.  For all procedures, there are risks of bleeding, infection, damage to surrounding organs including but not limited to bowel, bladder, blood vessels, ureters and  nerves, and need for further surgery if an injury were to occur. These risks are all low with minimally invasive surgery.   There are risks of numbness and weakness at any body site or buttock/rectal pain.  It is possible that baseline pain can be worsened by surgery, either with or without mesh. If surgery is vaginal, there is also a low risk of possible conversion to laparoscopy or open abdominal incision where indicated. Very rare risks include blood transfusion, blood clot, heart attack, pneumonia, or death.   There is also a risk of short-term postoperative urinary retention with need to use a catheter. About half of patients need to go home from surgery with a catheter, which is then later removed in the office. The risk of long-term need for a catheter is very low. There is also a risk of worsening of overactive bladder.     Prolapse (with or without mesh): Risk factors for surgical failure  include things that put pressure on your pelvis and the surgical repair, including obesity, chronic cough, and heavy lifting or straining (including lifting children or  adults, straining on the toilet, or lifting heavy objects such as furniture or anything weighing >25 lbs. Risks of recurrence is 20-30% with vaginal native tissue repair and a less than 10% with sacrocolpopexy with mesh.    Sacrocolpopexy: Mesh implants may provide more prolapse support, but do have some unique risks to consider. It is important to understand that mesh is permanent and cannot be easily removed. Risks of abdominal sacrocolpopexy mesh include mesh exposure (~3-6%), painful intercourse (recent studies show lower rates after surgery compared to before, with ~5-8% risk of new onset), and very rare risks of bowel or bladder injury or infection (<1%). The risk of mesh exposure is more likely in a woman with risks for poor healing (prior radiation, poorly controlled diabetes, or immunocompromised). The risk of new or worsened chronic pain after mesh implant is more common in women with baseline chronic pain and/or poorly controlled anxiety or depression. There is an FDA safety notification on vaginal mesh procedures for prolapse but NOT abdominal mesh procedures and therefore does not apply to your surgery. We have extensive experience and training with mesh placement and we have close postoperative follow up to identify any potential complications from mesh.    We discussed consent for blood products. Risks for blood transfusion include allergic reactions, other reactions that can affect different body organs and managed accordingly, transmission of infectious diseases such as HIV or Hepatitis. However, the blood is screened. Patient consents for blood products.  Pre-operative instructions:  She was instructed to not take Aspirin/NSAIDs x 7days prior to surgery. Antibiotic prophylaxis was ordered as indicated.  Catheter use: Patient will go home with foley if needed after post-operative voiding trial.  Post-operative instructions:  She was provided with specific post-operative instructions,  including precautions and signs/symptoms for which we would recommend contacting us, in addition to daytime and after-hours contact phone numbers. This was provided on a handout.   Post-operative medications: Prescriptions for motrin, tylenol, miralax, and tramadol were sent to her pharmacy. Discussed using ibuprofen and tylenol on a schedule to limit use of narcotics.   Laboratory testing:  type and screen  Preoperative clearance:  She does not require surgical clearance.    Post-operative follow-up:  A post-operative appointment will be made for 6 weeks from the date of surgery. If she needs a post-operative nurse visit for a voiding trial, that will be set up after she  leaves the hospital.    Patient will call the clinic or use MyChart should anything change or any new issues arise.   Jaquita Folds, MD   Time spent: I spent 20 minutes dedicated to the care of this patient on the date of this encounter to include pre-visit review of records, face-to-face time with the patient and post visit documentation and ordering medication/ testing.

## 2022-04-07 NOTE — H&P (Signed)
[facility] Pre-Operative H&P  Subjective Chief Complaint: KENIDY CROSSLAND presents for a preoperative encounter.   History of Present Illness: KENDLE TURBIN is a 67 y.o. female who presents for preoperative visit.  She is scheduled to undergo Robotic assisted sacrocolpopexy, cystoscopy on 04/20/21.  Her symptoms include vaginal bulge and incontinnece, and she was was found to have Stage II anterior, Stage I posterior, Stage I apical prolapse.   Urodynamic Impression:  1. Sensation was increased; capacity was normal 2. Stress Incontinence was demonstrated at normal pressures; 3. Detrusor Overactivity was not demonstrated  4. Emptying was a normal but prolonged uroflow pattern with a elevated PVR (443m), a sustained detrusor contraction present,  abdominal straining not present, normal urethral sphincter activity on EMG.  Past Medical History:  Diagnosis Date   Arthritis    [place]    Chest pain    Stress echo normal, October, 2012   Diabetes [place] without complication ([place]    Dyslipidemia    Ejection fraction    EF 55-60%, echo, October, 2012   GERD (gastroesophageal reflux disease)      Past Surgical History:  Procedure Laterality Date   CHOLECYSTECTOMY     EYE SURGERY Bilateral 07/15/2021   NASAL SINUS SURGERY     [facility]  [facility]  [facility]AL [facility]ST[facility]      is allergic to fenofibrate micronized, glipizide, codeine, farxiga [dapagliflozin], hydrocodone, hydrocodone-acetaminophen, and janumet [sitagliptin-metformin hcl].   Family History  Problem Relation Age of Onset   Cancer Mother    Cancer Father    Lupus Daughter    Arthritis/Rheumatoid Daughter    Diabetes type II Daughter     Social History   Tobacco Use   Smoking status: Former    Packs/day: 1.00    Types: Cigarettes   Smokeless tobacco: Never  Vaping Use   Vaping Use: Never used  Substance Use Topics   Alcohol use: Not  Currently   Drug use: No     Review of Systems was negative for a full 10 system review except as noted in the History of Present Illness.  No current facility-administered medications for this encounter.  Current Outpatient Medications:    acetaminophen (TYLENOL) 500 MG tablet, Take 1 tablet (500 mg total) by mouth every 6 (six) hours as needed (pain)., Disp: 30 tablet, Rfl: 0   atorvastatin (LIPITOR) 10 MG tablet, Take 1 tablet (10 mg total) by mouth daily., Disp: 90 tablet, Rfl: 3   blood glucose meter kit and supplies KIT, Dispense based on patient and insurance preference. Use up to four times daily as directed. (FOR ICD-9 250.00, 250.01). E11.9, Disp: 1 each, Rfl: 0   budesonide (ENTOCORT EC) 3 MG 24 hr capsule, Take 3 mg by mouth 2 (two) times daily., Disp: , Rfl:    cetirizine (ZYRTEC) 10 MG tablet, Take by mouth., Disp: , Rfl:    CONTOUR NEXT TEST test strip, CHECK BLOOD SUGAR UP TO 4 TIMES A DAY OR AS DIRECTED, Disp: 400 each, Rfl: 2   erythromycin ophthalmic ointment, as directed., Disp: , Rfl:    fluticasone (FLONASE) 50 MCG/ACT nasal spray, Place 1 spray into both nostrils 2 (two) times daily as needed for allergies or rhinitis., Disp: 48 g, Rfl: 1   ibuprofen (ADVIL) 600 MG tablet, Take 1 tablet (600 mg total) by mouth every 6 (six) hours as needed., Disp: 30 tablet, Rfl: 0   mycophenolate (MYFORTIC) 360 MG  TBEC EC tablet, Take 360 mg by mouth 2 (two) times daily., Disp: , Rfl:    pantoprazole (PROTONIX) 40 MG tablet, Take 1 tablet (40 mg total) by mouth daily., Disp: 90 tablet, Rfl: 3   polyethylene glycol powder (GLYCOLAX/MIRALAX) 17 GM/SCOOP powder, Take 17 g by mouth daily. Drink 17g (1 scoop) dissolved in water per day., Disp: 255 g, Rfl: 0   Semaglutide,0.25 or 0.5MG/DOS, (OZEMPIC, 0.25 OR 0.5 MG/DOSE,) 2 MG/3ML SOPN, Inject 0.5 mg into the skin every 7 (seven) days., Disp: 9 mL, Rfl: 3   traMADol (ULTRAM) 50 MG tablet, Take 1 tablet (50 mg total) by mouth every 8 (eight)  hours as needed for up to 5 days., Disp: 15 tablet, Rfl: 0   triamcinolone cream (KENALOG) 0.1 %, Apply 1 application. topically 2 (two) times daily as needed (eczema flare for up to 7 days). To affected area on ear, Disp: 30 g, Rfl: 1   Objective There were no vitals filed for this visit.   Gen: NAD CV: S1 S2 RRR Lungs: Clear to auscultation bilaterally Abd: soft, nontender   Previous Pelvic Exam showed: POP-Q   0                                            Aa   0                                           Ba   -4                                              C    3.5                                            Gh   4                                            Pb   6                                            tvl    -2.5                                            Ap   -2.5                                            Bp  D       Assessment/ Plan  The patient is a 67 y.o. year old scheduled to undergo Robotic assisted sacrocolpopexy, cystoscopy.    Jaquita Folds, MD

## 2022-04-14 ENCOUNTER — Encounter: Payer: Self-pay | Admitting: Nurse Practitioner

## 2022-04-14 ENCOUNTER — Ambulatory Visit: Payer: Medicare Other | Attending: Nurse Practitioner | Admitting: Nurse Practitioner

## 2022-04-14 VITALS — BP 158/84 | HR 74 | Ht 65.0 in | Wt 202.2 lb

## 2022-04-14 DIAGNOSIS — R002 Palpitations: Secondary | ICD-10-CM

## 2022-04-14 DIAGNOSIS — E785 Hyperlipidemia, unspecified: Secondary | ICD-10-CM

## 2022-04-14 DIAGNOSIS — E669 Obesity, unspecified: Secondary | ICD-10-CM | POA: Diagnosis present

## 2022-04-14 DIAGNOSIS — R0789 Other chest pain: Secondary | ICD-10-CM | POA: Diagnosis not present

## 2022-04-14 DIAGNOSIS — I10 Essential (primary) hypertension: Secondary | ICD-10-CM

## 2022-04-14 DIAGNOSIS — K449 Diaphragmatic hernia without obstruction or gangrene: Secondary | ICD-10-CM

## 2022-04-14 HISTORY — DX: Other chest pain: R07.89

## 2022-04-14 NOTE — Patient Instructions (Addendum)
Medication Instructions:  Your physician has recommended you make the following change in your medication:  Increase protonix to 40 mg twice daily for 2 weeks, then reduce to 40 mg daily Continue other medications the same  Labwork: none  Testing/Procedures: none  Follow-Up: Your physician recommends that you schedule a follow-up appointment in: 6 months with Dr. Harl Bowie  Any Other Special Instructions Will Be Listed Below (If Applicable).  If you need a refill on your cardiac medications before your next appointment, please call your pharmacy.

## 2022-04-14 NOTE — Progress Notes (Signed)
Cardiology Office Note:    Date:  04/14/2022  ID:  Taylor Leach, DOB September 20, 1955, MRN [id number]  PCP:  [name], DO   [facility] Providers Cardiologist:  Carlyle Dolly, MD     Referring MD: [name], DO   CC: Here for 1 year follow-up  History of Present Illness:    Taylor Leach is a 67 y.o. female with a hx of the following:  Palpitations History of chest pain Dyslipidemia Orthostatic dizziness Leg edema GERD, hiatal hernia Autoimmune hepatitis HTN  Patient is a very pleasant 67 year old female with past medical history as mentioned above.  Had echocardiogram and previous stress echo in 2012 that was overall unremarkable.  She was seen as a new consult in 2021 for evaluation of palpitations.  7-day monitor revealed sinus rhythm, rare PVCs with average heart rate 72, overall reassuring.  Last seen by Levell July, NP on February 18, 2021.  Denied any palpitations.  EKG showed sinus rhythm, minimal LVH, nonspecific T wave abnormality.  Was overall doing well from a cardiac perspective.  No change in her medication regimen was made.  Was told to follow-up in 1 year.  Today she presents for overdue 1 year follow-up.  She notes "tiny" chest pains x couple months, intermittent. Lasts for 2-3 minutes in duration, random, no alleviating or aggravating factors. Attributes this to her history of a hiatal hernia, points to lower mid chest area/lower esophageal. Denies any exertional symptoms.  Denies any shortness of breath, palpitations, syncope, presyncope, dizziness, orthopnea, PND, swelling or significant weight changes, acute bleeding, claudication. Says she is scheduled for surgery next week, however she denies needing cardiovascular clearance for surgery, and it appears that our office has not been contacted for official clearance. Denies any other questions or concerns.   Past Medical History:  Diagnosis Date   Arthritis    Autoimmune hepatitis  ([place])    Chest pain    Stress echo normal, October, 2012   Diabetes mellitus without complication ([place])    Dyslipidemia    Ejection fraction    EF 55-60%, echo, October, 2012   GERD (gastroesophageal reflux disease)     Past Surgical History:  Procedure Laterality Date   CHOLECYSTECTOMY     EYE SURGERY Bilateral 07/15/2021   NASAL SINUS SURGERY     [facility]PA[facility]  [facility]   VAGINAL HYSTERECTOMY      Current Medications: Current Meds  Medication Sig   acetaminophen (TYLENOL) 500 MG tablet Take 1 tablet (500 mg total) by mouth every 6 (six) hours as needed (pain).   atorvastatin (LIPITOR) 10 MG tablet Take 1 tablet (10 mg total) by mouth daily.   blood glucose meter kit and supplies KIT Dispense based on patient and insurance preference. Use up to four times daily as directed. (FOR ICD-9 250.00, 250.01). E11.9   budesonide (ENTOCORT EC) 3 MG 24 hr capsule Take 3 mg by mouth 2 (two) times daily.   cetirizine (ZYRTEC) 10 MG tablet Take by mouth.   CONTOUR NEXT TEST test strip CHECK BLOOD SUGAR UP TO 4 TIMES A DAY OR AS DIRECTED   erythromycin ophthalmic ointment as directed.   fluticasone (FLONASE) 50 MCG/ACT nasal spray Place 1 spray into both nostrils 2 (two) times daily as needed for allergies or rhinitis.   ibuprofen (ADVIL) 600 MG tablet Take 1 tablet (600 mg total) by mouth every 6 (six) hours as needed.   mycophenolate (MYFORTIC)  360 MG TBEC EC tablet Take 360 mg by mouth 2 (two) times daily.   pantoprazole (PROTONIX) 40 MG tablet Take 1 tablet (40 mg total) by mouth daily.   polyethylene glycol powder (GLYCOLAX/MIRALAX) 17 GM/SCOOP powder Take 17 g by mouth daily. Drink 17g (1 scoop) dissolved in water per day.   Semaglutide,0.25 or 0.5MG /DOS, (OZEMPIC, 0.25 OR 0.5 MG/DOSE,) 2 MG/3ML SOPN Inject 0.5 mg into the skin every 7 (seven) days.   triamcinolone cream (KENALOG) 0.1 % Apply 1 application. topically 2 (two) times daily as needed  (eczema flare for up to 7 days). To affected area on ear     Allergies:   Fenofibrate micronized, Glipizide, Codeine, Farxiga [dapagliflozin], Hydrocodone, Hydrocodone-acetaminophen, and Janumet [sitagliptin-metformin hcl]   Social History   Socioeconomic History   Marital status: Married    Spouse name: Not on file   Number of children: Not on file   Years of education: Not on file   Highest education level: Not on file  Occupational History   Not on file  Tobacco Use   Smoking status: Former    Packs/day: 1.00    Types: Cigarettes   Smokeless tobacco: Never  Vaping Use   Vaping Use: Never used  Substance and Sexual Activity   Alcohol use: Not Currently   Drug use: No   Sexual activity: Yes    Birth control/protection: Surgical  Other Topics Concern   Not on file  Social History Narrative   Not on file   Social Determinants of Health   Financial Resource Strain: Not on file  Food Insecurity: Not on file  Transportation Needs: Not on file  Physical Activity: Not on file  Stress: Not on file  Social Connections: Not on file     Family History: The patient's family history includes Arthritis/Rheumatoid in her daughter; Cancer in her father and mother; Diabetes type II in her daughter; Lupus in her daughter.  ROS:   Review of Systems  Constitutional: Negative.   HENT: Negative.    Eyes: Negative.   Respiratory: Negative.    Cardiovascular:  Positive for chest pain. Negative for palpitations, orthopnea, claudication, leg swelling and PND.  Gastrointestinal: Negative.        See HPI.   Genitourinary: Negative.   Musculoskeletal: Negative.   Skin: Negative.   Neurological: Negative.   Endo/Heme/Allergies: Negative.   Psychiatric/Behavioral: Negative.      Please see the history of present illness.    All other systems reviewed and are negative.  EKGs/Labs/Other Studies Reviewed:    The following studies were reviewed today:   EKG:  EKG is ordered today.   The ekg ordered today demonstrates sinus rhythm, 68 bpm, LVH, with nonspecific T wave abnormality, otherwise no acute ischemic changes.   Cardiac monitor on September 09, 2019: 7 day event monitor. Data available from 85% of planned monitored time. Min HR 48, Max HR 119, Avg HR 72 Symptoms correlate with sinsus rhythm, rare PVCs  Recent Labs: 04/30/2021: BUN 15; Creatinine, Ser 0.85; Potassium 4.0; Sodium 139 01/28/2022: ALT 30; Hemoglobin 14.7; Platelets 188  Recent Lipid Panel    Component Value Date/Time   CHOL 158 07/29/2021 0936   TRIG 139 07/29/2021 0936   HDL 56 07/29/2021 0936   CHOLHDL 2.8 07/29/2021 0936   CHOLHDL 4.1 10/23/2010 1748   VLDL 40 10/23/2010 1748   LDLCALC 78 07/29/2021 0936     Risk Assessment/Calculations:        The 10-year ASCVD risk score (Arnett DK,  et al., 2019) is: 13.6%   Values used to calculate the score:     Age: 51 years     Sex: Female     Is Non-Hispanic African American: No     Diabetic: Yes     Tobacco smoker: No     Systolic Blood Pressure: 409 mmHg     Is BP treated: No     HDL Cholesterol: 56 mg/dL     Total Cholesterol: 158 mg/dL   Physical Exam:    VS:  BP (!) 158/84   Pulse 74   Ht 5\' 5"  (1.651 m)   Wt 202 lb 3.2 oz (91.7 kg)   SpO2 96%   BMI 33.65 kg/m     Wt Readings from Last 3 Encounters:  04/14/22 202 lb 3.2 oz (91.7 kg)  03/02/22 202 lb (91.6 kg)  02/21/22 202 lb (91.6 kg)    Repeat BP 140/70  GEN: Obese, 67 y.o. female in no acute distress HEENT: Normal NECK: No JVD; No carotid bruits CARDIAC: S1/S2, RRR, no murmurs, rubs, gallops; 2+ pulses RESPIRATORY:  Clear to auscultation without rales, wheezing or rhonchi  MUSCULOSKELETAL:  No edema; No deformity  SKIN: Warm and dry NEUROLOGIC:  Alert and oriented x 3 PSYCHIATRIC:  Normal affect   ASSESSMENT:    1. Atypical chest pain   2. Hiatal hernia   3. Palpitations   4. Dyslipidemia   5. Hypertension, unspecified type   6. Obesity (BMI 30-39.9)     PLAN:    In order of problems listed above:  Atypical chest pain/ hiatal hernia Very atypical symptoms. Symptoms sounds most suggestive of hiatal hernia. Denies any exertional symptoms. Patient prefers watchful waiting at this time. Continue current medication regimen at this time and declines ischemic evaluation, encouraged compliance with Protonix. Heart healthy diet and regular cardiovascular exercise encouraged. ED precautions discussed. Continue to follow with PCP.  Palpitations Denies any tachycardia or palpitations. Cardiac monitor in 2021 was unremarkable. Continue current medication regimen. Heart healthy diet and regular cardiovascular exercise encouraged.   Dyslipidemia Lipid panel 07/2021 was unremarkable. Continue atorvastatin. Being managed by PCP. Heart healthy diet and regular cardiovascular exercise encouraged.   4. HTN BP on arrival 158/84. BP repeat 140/70. BP well controlled at home. Discussed to monitor BP at home at least 2 hours after medications and sitting for 5-10 minutes. Goal SBP < 130. Given BP log and discussed to notify office if BP is consistently elevated. Heart healthy diet and regular cardiovascular exercise encouraged. Continue to follow with PCP. Consider low dose ACE/ARB/CCB if SBP is not at goal by next OV.   5. Obesity BMI today is 33.65. Weight loss via diet and exercise encouraged. Discussed the impact being overweight would have on cardiovascular risk. Heart healthy diet and regular cardiovascular exercise encouraged. Continue Ozempic. At next OV, discuss PREP program.   6. Disposition: Follow-up with Dr. Carlyle Dolly in 6 months or sooner if anything changes.     Medication Adjustments/Labs and Tests Ordered: Current medicines are reviewed at length with the patient today.  Concerns regarding medicines are outlined above.  Orders Placed This Encounter  Procedures   EKG 12-Lead   No orders of the defined types were placed in this  encounter.   Patient Instructions  Medication Instructions:  Your physician has recommended you make the following change in your medication:  Increase protonix to 40 mg twice daily for 2 weeks, then reduce to 40 mg daily Continue other medications the same  Labwork: none  Testing/Procedures: none  Follow-Up: Your physician recommends that you schedule a follow-up appointment in: 6 months with Dr. Wyline Mood  Any Other Special Instructions Will Be Listed Below (If Applicable).  If you need a refill on your cardiac medications before your next appointment, please call your pharmacy.   SignedSharlene Dory, NP  04/16/2022 9:32 AM    [facility]

## 2022-04-16 ENCOUNTER — Encounter: Payer: Self-pay | Admitting: Nurse Practitioner

## 2022-04-18 ENCOUNTER — Encounter (HOSPITAL_BASED_OUTPATIENT_CLINIC_OR_DEPARTMENT_OTHER): Payer: Self-pay | Admitting: Obstetrics and Gynecology

## 2022-04-19 ENCOUNTER — Encounter (HOSPITAL_BASED_OUTPATIENT_CLINIC_OR_DEPARTMENT_OTHER): Payer: Self-pay | Admitting: Obstetrics and Gynecology

## 2022-04-19 ENCOUNTER — Other Ambulatory Visit: Payer: Self-pay

## 2022-04-19 NOTE — Progress Notes (Signed)
Spoke w/ via phone for pre-op interview---Gerian[facility] Lab needs dos---- type & screen, cmp              Lab results------04/14/2022 EKG in chart & Epic COVID test -----patient states asymptomatic no test needed Arrive at ------1000 on Wednesday, 04/20/25 NPO after MN NO Solid Food.  Clear liquids from MN until---0900 Med rec completed Medications to take morning of surgery -----Lipitor, Budesonide, Myfortic, Protonix Diabetic medication -----Patient is holding Ozempic. Last dose was 04/10/22. Will resume after surgery. Patient instructed no nail polish to be worn day of surgery Patient instructed to bring photo id and insurance card day of surgery Patient aware to have Driver (ride ) / caregiver    for 24 hours after surgery - daughter, Lenna Sciara Patient Special Instructions -----none Pre-Op special Istructions -----none Patient verbalized understanding of instructions that were given at this phone interview. Patient denies shortness of breath, chest pain, fever, cough at this phone interview.  Patient has a hx of palpitations, chest pain, dyslipidemia, orthostatic dizziness, GERD, hiatal hernia, autoimmune hepatitis, and HTN.  PCP: Dr. Adam Phenix, lov 01/28/22 in Epic Gastroenterologist: Charisse Klinefelter, NP, lov 12/14/21 in Epic Cardiologist: Finis Bud, NP, [place] 04/14/22 in [place]

## 2022-04-20 ENCOUNTER — Ambulatory Visit (HOSPITAL_BASED_OUTPATIENT_CLINIC_OR_DEPARTMENT_OTHER): Payer: Medicare Other | Admitting: Anesthesiology

## 2022-04-20 ENCOUNTER — Encounter (HOSPITAL_BASED_OUTPATIENT_CLINIC_OR_DEPARTMENT_OTHER): Payer: Self-pay | Admitting: Obstetrics and Gynecology

## 2022-04-20 ENCOUNTER — Other Ambulatory Visit: Payer: Self-pay

## 2022-04-20 ENCOUNTER — Encounter (HOSPITAL_BASED_OUTPATIENT_CLINIC_OR_DEPARTMENT_OTHER)
Admission: RE | Disposition: A | Discharge status: Home / Self Care | Payer: Self-pay | Source: Home / Self Care | Attending: Obstetrics and Gynecology

## 2022-04-20 ENCOUNTER — Ambulatory Visit (HOSPITAL_BASED_OUTPATIENT_CLINIC_OR_DEPARTMENT_OTHER)
Admission: RE | Admit: 2022-04-20 | Discharge: 2022-04-20 | Disposition: A | Discharge status: Home / Self Care | Payer: Medicare Other | Attending: Obstetrics and Gynecology | Admitting: Obstetrics and Gynecology

## 2022-04-20 DIAGNOSIS — E119 Type 2 diabetes mellitus without complications: Secondary | ICD-10-CM | POA: Insufficient documentation

## 2022-04-20 DIAGNOSIS — N8110 Cystocele, unspecified: Secondary | ICD-10-CM | POA: Diagnosis not present

## 2022-04-20 DIAGNOSIS — N393 Stress incontinence (female) (male): Secondary | ICD-10-CM | POA: Diagnosis not present

## 2022-04-20 DIAGNOSIS — K219 Gastro-esophageal reflux disease without esophagitis: Secondary | ICD-10-CM | POA: Insufficient documentation

## 2022-04-20 DIAGNOSIS — Z90710 Acquired absence of both cervix and uterus: Secondary | ICD-10-CM | POA: Insufficient documentation

## 2022-04-20 DIAGNOSIS — Z01818 Encounter for other preprocedural examination: Secondary | ICD-10-CM

## 2022-04-20 DIAGNOSIS — N993 Prolapse of vaginal vault after hysterectomy: Secondary | ICD-10-CM | POA: Diagnosis present

## 2022-04-20 DIAGNOSIS — K7581 Nonalcoholic steatohepatitis (NASH): Secondary | ICD-10-CM | POA: Diagnosis not present

## 2022-04-20 DIAGNOSIS — Z87891 Personal history of nicotine dependence: Secondary | ICD-10-CM | POA: Insufficient documentation

## 2022-04-20 HISTORY — PX: CYSTOSCOPY: SHX5120

## 2022-04-20 HISTORY — DX: Presence of spectacles and contact lenses: Z97.3

## 2022-04-20 HISTORY — DX: Hereditary and idiopathic neuropathy, unspecified: G60.9

## 2022-04-20 HISTORY — PX: ROBOTIC ASSISTED LAPAROSCOPIC SACROCOLPOPEXY: SHX5388

## 2022-04-20 HISTORY — DX: Nonalcoholic steatohepatitis (NASH): K75.81

## 2022-04-20 HISTORY — DX: Diaphragmatic hernia without obstruction or gangrene: K44.9

## 2022-04-20 LAB — COMPREHENSIVE METABOLIC PANEL
ALT: 33 U/L (ref 0–44)
AST: 27 U/L (ref 15–41)
Albumin: 3.8 g/dL (ref 3.5–5.0)
Alkaline Phosphatase: 56 U/L (ref 38–126)
Anion gap: 9 (ref 5–15)
BUN: 14 mg/dL (ref 8–23)
CO2: 22 mmol/L (ref 22–32)
Calcium: 9.1 mg/dL (ref 8.9–10.3)
Chloride: 106 mmol/L (ref 98–111)
Creatinine, Ser: 0.93 mg/dL (ref 0.44–1.00)
GFR, Estimated: >60 mL/min (ref >60)
Glucose, Bld: 108 mg/dL — ABNORMAL HIGH (ref 70–99)
Potassium: 3.5 mmol/L (ref 3.5–5.1)
Sodium: 137 mmol/L (ref 135–145)
Total Bilirubin: 0.9 mg/dL (ref 0.3–1.2)
Total Protein: 7.7 g/dL (ref 6.5–8.1)

## 2022-04-20 LAB — TYPE AND SCREEN
ABO/RH(D): O POS
Antibody Screen: NEGATIVE

## 2022-04-20 LAB — GLUCOSE, CAPILLARY
Glucose-Capillary: 110 mg/dL — ABNORMAL HIGH (ref 70–99)
Glucose-Capillary: 150 mg/dL — ABNORMAL HIGH (ref 70–99)

## 2022-04-20 LAB — ABO/RH: ABO/RH(D): O POS

## 2022-04-20 SURGERY — SACROCOLPOPEXY, ROBOT-ASSISTED, LAPAROSCOPIC
Anesthesia: General | Site: Pelvis

## 2022-04-20 MED ORDER — ONDANSETRON HCL 4 MG PO TABS
4.0000 mg | ORAL_TABLET | Freq: Four times a day (QID) | ORAL | Status: DC | PRN
  Administered 2022-04-20: 4 mg via ORAL

## 2022-04-20 MED ORDER — PHENYLEPHRINE 80 MCG/ML (10ML) SYRINGE FOR IV PUSH (FOR BLOOD PRESSURE SUPPORT)
PREFILLED_SYRINGE | INTRAVENOUS | Status: DC | PRN
  Administered 2022-04-20: 80 ug via INTRAVENOUS

## 2022-04-20 MED ORDER — LIDOCAINE 2% (20 MG/ML) 5 ML SYRINGE
INTRAMUSCULAR | Status: DC | PRN
  Administered 2022-04-20: 60 mg via INTRAVENOUS

## 2022-04-20 MED ORDER — GABAPENTIN 300 MG PO CAPS
300.0000 mg | ORAL_CAPSULE | ORAL | Status: AC
  Administered 2022-04-20: 300 mg via ORAL

## 2022-04-20 MED ORDER — FENTANYL CITRATE (PF) 100 MCG/2ML IJ SOLN
INTRAMUSCULAR | Status: AC
  Filled 2022-04-20: qty 2

## 2022-04-20 MED ORDER — SCOPOLAMINE 1 MG/3DAYS TD PT72
MEDICATED_PATCH | TRANSDERMAL | Status: AC
  Filled 2022-04-20: qty 1

## 2022-04-20 MED ORDER — EPHEDRINE 5 MG/ML INJ
INTRAVENOUS | Status: AC
  Filled 2022-04-20: qty 5

## 2022-04-20 MED ORDER — MIDAZOLAM HCL 2 MG/2ML IJ SOLN
INTRAMUSCULAR | Status: AC
  Filled 2022-04-20: qty 2

## 2022-04-20 MED ORDER — KETOROLAC TROMETHAMINE 30 MG/ML IJ SOLN
INTRAMUSCULAR | Status: AC
  Filled 2022-04-20: qty 1

## 2022-04-20 MED ORDER — PROPOFOL 10 MG/ML IV BOLUS
INTRAVENOUS | Status: AC
  Filled 2022-04-20: qty 20

## 2022-04-20 MED ORDER — OXYCODONE HCL 5 MG PO TABS
ORAL_TABLET | ORAL | Status: AC
  Filled 2022-04-20: qty 1

## 2022-04-20 MED ORDER — PROPOFOL 10 MG/ML IV BOLUS
INTRAVENOUS | Status: DC | PRN
  Administered 2022-04-20: 150 mg via INTRAVENOUS

## 2022-04-20 MED ORDER — IBUPROFEN 200 MG PO TABS: 600.0000 mg | ORAL_TABLET | Freq: Four times a day (QID) | ORAL | Status: DC

## 2022-04-20 MED ORDER — SCOPOLAMINE 1 MG/3DAYS TD PT72
1.0000 | MEDICATED_PATCH | Freq: Once | TRANSDERMAL | Status: DC
  Administered 2022-04-20: 1.5 mg via TRANSDERMAL

## 2022-04-20 MED ORDER — EPHEDRINE SULFATE-NACL 50-0.9 MG/10ML-% IV SOSY
PREFILLED_SYRINGE | INTRAVENOUS | Status: DC | PRN
  Administered 2022-04-20: 20 mg via INTRAVENOUS

## 2022-04-20 MED ORDER — ROCURONIUM BROMIDE 10 MG/ML (PF) SYRINGE
PREFILLED_SYRINGE | INTRAVENOUS | Status: DC | PRN
  Administered 2022-04-20: 70 mg via INTRAVENOUS
  Administered 2022-04-20: 10 mg via INTRAVENOUS

## 2022-04-20 MED ORDER — PHENAZOPYRIDINE HCL 100 MG PO TABS
ORAL_TABLET | ORAL | Status: AC
  Filled 2022-04-20: qty 2

## 2022-04-20 MED ORDER — ENOXAPARIN SODIUM 40 MG/0.4ML IJ SOSY
40.0000 mg | PREFILLED_SYRINGE | INTRAMUSCULAR | Status: AC
  Administered 2022-04-20: 40 mg via SUBCUTANEOUS

## 2022-04-20 MED ORDER — OXYCODONE HCL 5 MG PO TABS
5.0000 mg | ORAL_TABLET | ORAL | Status: DC | PRN
  Administered 2022-04-20 (×2): 5 mg via ORAL

## 2022-04-20 MED ORDER — GABAPENTIN 300 MG PO CAPS
ORAL_CAPSULE | ORAL | Status: AC
  Filled 2022-04-20: qty 1

## 2022-04-20 MED ORDER — ROCURONIUM BROMIDE 10 MG/ML (PF) SYRINGE
PREFILLED_SYRINGE | INTRAVENOUS | Status: AC
  Filled 2022-04-20: qty 10

## 2022-04-20 MED ORDER — KETOROLAC TROMETHAMINE 30 MG/ML IJ SOLN
INTRAMUSCULAR | Status: DC | PRN
  Administered 2022-04-20: 30 mg via INTRAVENOUS

## 2022-04-20 MED ORDER — MIDAZOLAM HCL 2 MG/2ML IJ SOLN
INTRAMUSCULAR | Status: DC | PRN
  Administered 2022-04-20: 2 mg via INTRAVENOUS

## 2022-04-20 MED ORDER — CEFAZOLIN SODIUM-DEXTROSE 2-4 GM/100ML-% IV SOLN
2.0000 g | INTRAVENOUS | Status: AC
  Administered 2022-04-20: 2 g via INTRAVENOUS

## 2022-04-20 MED ORDER — ONDANSETRON HCL 4 MG/2ML IJ SOLN
INTRAMUSCULAR | Status: DC | PRN
  Administered 2022-04-20: 4 mg via INTRAVENOUS

## 2022-04-20 MED ORDER — CEFAZOLIN SODIUM-DEXTROSE 2-4 GM/100ML-% IV SOLN
INTRAVENOUS | Status: AC
  Filled 2022-04-20: qty 100

## 2022-04-20 MED ORDER — ACETAMINOPHEN 500 MG PO TABS
1000.0000 mg | ORAL_TABLET | ORAL | Status: AC
  Administered 2022-04-20: 1000 mg via ORAL

## 2022-04-20 MED ORDER — SIMETHICONE 80 MG PO CHEW: 80.0000 mg | CHEWABLE_TABLET | Freq: Four times a day (QID) | ORAL | Status: DC | PRN

## 2022-04-20 MED ORDER — SODIUM CHLORIDE 0.9 % IR SOLN
Status: DC | PRN
  Administered 2022-04-20: 1000 mL
  Administered 2022-04-20: 1000 mL via INTRAVESICAL

## 2022-04-20 MED ORDER — LACTATED RINGERS IV SOLN: INTRAVENOUS | Status: DC

## 2022-04-20 MED ORDER — FENTANYL CITRATE (PF) 100 MCG/2ML IJ SOLN
25.0000 ug | INTRAMUSCULAR | Status: DC | PRN
  Administered 2022-04-20 (×2): 25 ug via INTRAVENOUS

## 2022-04-20 MED ORDER — SUGAMMADEX SODIUM 200 MG/2ML IV SOLN
INTRAVENOUS | Status: DC | PRN
  Administered 2022-04-20: 200 mg via INTRAVENOUS

## 2022-04-20 MED ORDER — ONDANSETRON HCL 4 MG/2ML IJ SOLN: 4.0000 mg | Freq: Four times a day (QID) | INTRAMUSCULAR | Status: DC | PRN

## 2022-04-20 MED ORDER — PROMETHAZINE HCL 25 MG/ML IJ SOLN: 6.2500 mg | INTRAMUSCULAR | Status: DC | PRN

## 2022-04-20 MED ORDER — ACETAMINOPHEN 500 MG PO TABS: 1000.0000 mg | ORAL_TABLET | Freq: Once | ORAL | Status: DC

## 2022-04-20 MED ORDER — STERILE WATER FOR IRRIGATION IR SOLN
Status: DC | PRN
  Administered 2022-04-20: 500 mL

## 2022-04-20 MED ORDER — ACETAMINOPHEN 325 MG PO TABS
650.0000 mg | ORAL_TABLET | ORAL | Status: DC | PRN
  Administered 2022-04-20: 650 mg via ORAL

## 2022-04-20 MED ORDER — ONDANSETRON HCL 4 MG/2ML IJ SOLN
INTRAMUSCULAR | Status: AC
  Filled 2022-04-20: qty 2

## 2022-04-20 MED ORDER — BUPIVACAINE HCL (PF) 0.25 % IJ SOLN
INTRAMUSCULAR | Status: DC | PRN
  Administered 2022-04-20: 25 mL

## 2022-04-20 MED ORDER — POVIDONE-IODINE 10 % EX SWAB: 2.0000 | Freq: Once | CUTANEOUS | Status: DC

## 2022-04-20 MED ORDER — FENTANYL CITRATE (PF) 100 MCG/2ML IJ SOLN
INTRAMUSCULAR | Status: DC | PRN
  Administered 2022-04-20 (×2): 50 ug via INTRAVENOUS

## 2022-04-20 MED ORDER — ACETAMINOPHEN 325 MG PO TABS
ORAL_TABLET | ORAL | Status: AC
  Filled 2022-04-20: qty 2

## 2022-04-20 MED ORDER — LIDOCAINE HCL (PF) 2 % IJ SOLN
INTRAMUSCULAR | Status: AC
  Filled 2022-04-20: qty 5

## 2022-04-20 MED ORDER — PHENAZOPYRIDINE HCL 100 MG PO TABS
200.0000 mg | ORAL_TABLET | ORAL | Status: AC
  Administered 2022-04-20: 200 mg via ORAL

## 2022-04-20 MED ORDER — PHENYLEPHRINE 80 MCG/ML (10ML) SYRINGE FOR IV PUSH (FOR BLOOD PRESSURE SUPPORT)
PREFILLED_SYRINGE | INTRAVENOUS | Status: AC
  Filled 2022-04-20: qty 10

## 2022-04-20 MED ORDER — HEMOSTATIC AGENTS (NO CHARGE) OPTIME
TOPICAL | Status: DC | PRN
  Administered 2022-04-20: 1

## 2022-04-20 MED ORDER — ENOXAPARIN SODIUM 40 MG/0.4ML IJ SOSY
PREFILLED_SYRINGE | INTRAMUSCULAR | Status: AC
  Filled 2022-04-20: qty 0.4

## 2022-04-20 MED ORDER — ONDANSETRON HCL 4 MG PO TABS
ORAL_TABLET | ORAL | Status: AC
  Filled 2022-04-20: qty 1

## 2022-04-20 MED ORDER — ACETAMINOPHEN 500 MG PO TABS
ORAL_TABLET | ORAL | Status: AC
  Filled 2022-04-20: qty 2

## 2022-04-20 SURGICAL SUPPLY — 89 items
ADH SKN CLS APL DERMABOND .7 (GAUZE/BANDAGES/DRESSINGS) ×3
AGENT HMST KT MTR STRL THRMB (HEMOSTASIS)
APL PRP STRL LF DISP 70% ISPRP (MISCELLANEOUS) ×3
APL SRG 38 LTWT LNG FL B (MISCELLANEOUS) ×3
APPLICATOR ARISTA FLEXITIP XL (MISCELLANEOUS) ×1 IMPLANT
BLADE CLIPPER SENSICLIP SURGIC (BLADE) ×4 IMPLANT
BLADE SURG 15 STRL LF DISP TIS (BLADE) ×4 IMPLANT
BLADE SURG 15 STRL SS (BLADE) ×3
CATH FOLEY 3WAY  5CC 16FR (CATHETERS) ×3
CATH FOLEY 3WAY 5CC 16FR (CATHETERS) ×4 IMPLANT
CHLORAPREP W/TINT 26 (MISCELLANEOUS) ×4 IMPLANT
COVER BACK TABLE 60X90IN (DRAPES) ×4 IMPLANT
COVER TIP SHEARS 8 DVNC (MISCELLANEOUS) ×4 IMPLANT
COVER TIP SHEARS 8MM DA VINCI (MISCELLANEOUS) ×3
DEFOGGER SCOPE WARMER CLEARIFY (MISCELLANEOUS) ×4 IMPLANT
DERMABOND ADVANCED .7 DNX12 (GAUZE/BANDAGES/DRESSINGS) ×4 IMPLANT
DRAPE ARM DVNC X/XI (DISPOSABLE) ×16 IMPLANT
DRAPE COLUMN DVNC XI (DISPOSABLE) ×4 IMPLANT
DRAPE DA VINCI XI ARM (DISPOSABLE) ×12
DRAPE DA VINCI XI COLUMN (DISPOSABLE) ×3
DRAPE SHEET LG 3/4 BI-LAMINATE (DRAPES) ×4 IMPLANT
DRAPE UTILITY XL STRL (DRAPES) ×4 IMPLANT
ELECT REM PT RETURN 9FT ADLT (ELECTROSURGICAL) ×3
ELECTRODE REM PT RTRN 9FT ADLT (ELECTROSURGICAL) ×4 IMPLANT
GAUZE 4X4 16PLY ~~LOC~~+RFID DBL (SPONGE) ×8 IMPLANT
GLOVE BIOGEL PI IND STRL 6.5 (GLOVE) ×16 IMPLANT
GLOVE BIOGEL PI IND STRL 7.0 (GLOVE) ×8 IMPLANT
GLOVE ECLIPSE 6.0 STRL STRAW (GLOVE) ×12 IMPLANT
GOWN STRL REUS W/TWL LRG LVL3 (GOWN DISPOSABLE) ×4 IMPLANT
HEMOSTAT ARISTA ABSORB 3G PWDR (HEMOSTASIS) ×1 IMPLANT
HIBICLENS CHG 4% 4OZ BTL (MISCELLANEOUS) ×4 IMPLANT
HOLDER FOLEY CATH W/STRAP (MISCELLANEOUS) ×4 IMPLANT
IRRIG SUCT STRYKERFLOW 2 WTIP (MISCELLANEOUS) ×3
IRRIGATION SUCT STRKRFLW 2 WTP (MISCELLANEOUS) ×4 IMPLANT
KIT TURNOVER CYSTO (KITS) ×4 IMPLANT
LEGGING LITHOTOMY PAIR STRL (DRAPES) ×4 IMPLANT
MANIFOLD NEPTUNE II (INSTRUMENTS) ×4 IMPLANT
MANIPULATOR ADVINCU DEL 2.5 PL (MISCELLANEOUS) IMPLANT
MANIPULATOR ADVINCU DEL 3.0 PL (MISCELLANEOUS) IMPLANT
MANIPULATOR ADVINCU DEL 3.5 PL (MISCELLANEOUS) IMPLANT
MANIPULATOR ADVINCU DEL 4.0 PL (MISCELLANEOUS) IMPLANT
MESH VERTESSA LITE -Y 2X4X3 (Mesh General) ×4 IMPLANT
NDL INSUFFLATION 14GA 120MM (NEEDLE) ×3 IMPLANT
NEEDLE HYPO 22GX1.5 SAFETY (NEEDLE) ×4 IMPLANT
NEEDLE INSUFFLATION 14GA 120MM (NEEDLE) ×3 IMPLANT
NS IRRIG 1000ML POUR BTL (IV SOLUTION) ×4 IMPLANT
OBTURATOR OPTICAL STANDARD 8MM (TROCAR) ×3
OBTURATOR OPTICAL STND 8 DVNC (TROCAR) ×3
OBTURATOR OPTICALSTD 8 DVNC (TROCAR) ×4 IMPLANT
PACK CYSTO (CUSTOM PROCEDURE TRAY) ×4 IMPLANT
PACK ROBOT WH (CUSTOM PROCEDURE TRAY) ×4 IMPLANT
PACK ROBOTIC GOWN (GOWN DISPOSABLE) ×4 IMPLANT
PACK VAGINAL WOMENS (CUSTOM PROCEDURE TRAY) ×4 IMPLANT
PAD OB MATERNITY 4.3X12.25 (PERSONAL CARE ITEMS) ×4 IMPLANT
PAD POSITIONING PINK XL (MISCELLANEOUS) ×4 IMPLANT
PAD PREP 24X48 CUFFED NSTRL (MISCELLANEOUS) ×4 IMPLANT
POUCH LAPAROSCOPIC INSTRUMENT (MISCELLANEOUS) IMPLANT
PROTECTOR NERVE ULNAR (MISCELLANEOUS) ×4 IMPLANT
RETRACTOR LONE STAR DISPOSABLE (INSTRUMENTS) ×4 IMPLANT
RETRACTOR STAY HOOK 5MM (MISCELLANEOUS) ×4 IMPLANT
SEAL CANN UNIV 5-8 DVNC XI (MISCELLANEOUS) ×20 IMPLANT
SEAL XI 5MM-8MM UNIVERSAL (MISCELLANEOUS) ×15
SEALER VESSEL DA VINCI XI (MISCELLANEOUS)
SEALER VESSEL EXT DVNC XI (MISCELLANEOUS) IMPLANT
SET IRRIG Y TYPE TUR BLADDER L (SET/KITS/TRAYS/PACK) ×4 IMPLANT
SET TUBE SMOKE EVAC HIGH FLOW (TUBING) ×4 IMPLANT
SPIKE FLUID TRANSFER (MISCELLANEOUS) ×8 IMPLANT
SPONGE T-LAP 4X18 ~~LOC~~+RFID (SPONGE) IMPLANT
SUCTION FRAZIER HANDLE 10FR (MISCELLANEOUS) ×3
SUCTION TUBE FRAZIER 10FR DISP (MISCELLANEOUS) ×4 IMPLANT
SURGIFLO W/THROMBIN 8M KIT (HEMOSTASIS) IMPLANT
SUT ABS MONO DBL WITH NDL 48IN (SUTURE) IMPLANT
SUT GORETEX NAB #0 THX26 36IN (SUTURE) ×5 IMPLANT
SUT MNCRL AB 4-0 PS2 18 (SUTURE) ×4 IMPLANT
SUT MON AB 2-0 SH 27 (SUTURE) ×4 IMPLANT
SUT V-LOC BARB 180 2/0GR6 GS22 (SUTURE)
SUT V-LOC BARB 180 2/0GR9 GS23 (SUTURE) ×6
SUT VIC AB 0 CT1 27 (SUTURE) ×6
SUT VIC AB 0 CT1 27XBRD ANTBC (SUTURE) ×8 IMPLANT
SUT VIC AB 2-0 SH 27 (SUTURE) ×3
SUT VIC AB 2-0 SH 27XBRD (SUTURE) ×4 IMPLANT
SUT VLOC 180 0 9IN  GS21 (SUTURE)
SUT VLOC 180 0 9IN GS21 (SUTURE) IMPLANT
SUT VLOC 180 2-0 9IN GS21 (SUTURE) ×1 IMPLANT
SUTURE V-LC BRB 180 2/0GR6GS22 (SUTURE) IMPLANT
SUTURE V-LC BRB 180 2/0GR9GS23 (SUTURE) ×8 IMPLANT
SYR BULB EAR ULCER 3OZ GRN STR (SYRINGE) ×4 IMPLANT
TOWEL OR 17X26 10 PK STRL BLUE (TOWEL DISPOSABLE) ×4 IMPLANT
TRAY FOLEY W/BAG SLVR 14FR (SET/KITS/TRAYS/PACK) ×4 IMPLANT

## 2022-04-20 NOTE — Interval H&P Note (Signed)
History and Physical Interval Note:  04/20/2022 11:42 AM  Taylor Leach  has presented today for surgery, with the diagnosis of anterior vaginal prolapse; prolapse of vaginal vault after hysterectomy; stress urinary incontinence.  The various methods of treatment have been discussed with the patient and family. After consideration of risks, benefits and other options for treatment, the patient has consented to  Procedure(s): XI ROBOTIC ASSISTED LAPAROSCOPIC SACROCOLPOPEXY (N/A) CYSTOSCOPY (N/A) as a surgical intervention.  The patient's history has been reviewed, patient examined, no change in status, stable for surgery.  I have reviewed the patient's chart and labs.  Questions were answered to the patient's satisfaction.     Jaquita Folds

## 2022-04-20 NOTE — Discharge Instructions (Signed)

## 2022-04-20 NOTE — Anesthesia Preprocedure Evaluation (Addendum)
Anesthesia Evaluation  Patient identified by MRN, date of birth, ID band Patient awake    Reviewed: Allergy & Precautions, NPO status , Patient's Chart, lab work & pertinent test results  Airway Mallampati: III  TM Distance: >3 FB Neck ROM: Full    Dental  (+) Teeth Intact, Dental Advisory Given   Pulmonary former smoker   Pulmonary exam normal breath sounds clear to auscultation       Cardiovascular hypertension, Normal cardiovascular exam Rhythm:Regular Rate:Normal     Neuro/Psych negative neurological ROS  negative psych ROS   GI/Hepatic hiatal hernia,GERD  Medicated,,(+) Hepatitis - ([facility])  Endo/Other  diabetes, Type 2  Obesity   Renal/GU negative Renal ROS    anterior vaginal prolapse; prolapse of vaginal vault after hysterectomy; stress urinary incontinence    Musculoskeletal  (+) Arthritis ,    Abdominal   Peds  Hematology negative hematology ROS (+)   Anesthesia Other Findings Day of surgery medications reviewed with the patient.  Reproductive/Obstetrics                             Anesthesia Physical Anesthesia Plan  ASA: 2  Anesthesia Plan: General   Post-op Pain Management: Tylenol PO (pre-op)*, Ketamine IV* and Toradol IV (intra-op)*   Induction: Intravenous  PONV Risk Score and Plan: 4 or greater and Scopolamine patch - Pre-op, Midazolam, Dexamethasone and Ondansetron  Airway Management Planned: Oral ETT  Additional Equipment:   Intra-op Plan:   Post-operative Plan: Extubation in OR  Informed Consent: I have reviewed the patients History and Physical, chart, labs and discussed the procedure including the risks, benefits and alternatives for the proposed anesthesia with the patient or authorized representative who has indicated his/her understanding and acceptance.     Dental advisory given  Plan Discussed with: CRNA  Anesthesia Plan Comments: (2nd PIV)        Anesthesia Quick Evaluation

## 2022-04-20 NOTE — Anesthesia Procedure Notes (Signed)
Procedure Name: Intubation Date/Time: 04/20/2022 12:18 PM  Performed by: Suan Halter, CRNAPre-anesthesia Checklist: Patient identified, Emergency Drugs available, Suction available and Patient being monitored Patient Re-evaluated:Patient Re-evaluated prior to induction Oxygen Delivery Method: Circle system utilized Preoxygenation: Pre-oxygenation with 100% oxygen Induction Type: IV induction Ventilation: Mask ventilation without difficulty Laryngoscope Size: Mac and 3 Grade View: Grade I Tube type: Oral Number of attempts: 1 Airway Equipment and Method: Stylet and Oral airway Placement Confirmation: ETT inserted through vocal cords under direct vision, positive ETCO2 and breath sounds checked- equal and bilateral Secured at: 22 cm Tube secured with: Tape Dental Injury: Teeth and Oropharynx as per pre-operative assessment

## 2022-04-20 NOTE — Op Note (Signed)
Operative Note  Preoperative Diagnosis: anterior vaginal prolapse and vaginal vault prolapse after hysterectomy  Postoperative Diagnosis: same  Procedures performed:  Robotic assisted sacrocolpopexy Erenest Blank Lite Y), cystoscopy  Implants:  Implant Name Type Inv. Item Serial No. Manufacturer Lot No. LRB No. Used Action  MESH Valli Glance [id number] [employer] [id number] N/A 1 Implanted    Attending Surgeon: Sherlene Shams, MD  Anesthesia: General endotracheal  Findings: 1. On vaginal exam, stage II prolapse noted.   2. On laparoscopy, normal appearing fallopian tubes and ovaries bilaterally. No adhesions present.   3. On cystoscopy, normal urethra and bladder mucosa without injury, lesion or foreign body. Brisk bilateral ureteral efflux noted.    Specimens: none  Estimated blood loss: 25 mL  IV fluids: 900 mL  Urine output: 338 mL  Complications: none  Procedure in Detail: After informed consent was obtained, the patient was taken to the operating room, where general anesthesia was induced and found to be adequate. She was placed in dorsolithotomy position in yellowfin stirrups. Her hips were noted not to be hyperflexed or hyperextended. Her arms were padded with gel pads and tucked to her sides. Her hands were surrounded by foam. A padded strap was placed across her chest with foam between the pad and her skin. She was noted to be appropriately positioned with all pressure points well padded and off tension. A tilt test showed no slippage. She was prepped and draped in the usual sterile fashion.  A sterile Foley catheter was inserted.   0.25% plain Marcaine was injected at the umbilicus and an incision was made with a scalpel. A Veress needle was inserted into the incision, CO2 insufflation was started, a low opening pressure was noted, and pneumoperitoneum was obtained. The Veress needle was removed and an 64mm robotic  trocar was placed. Entry into the peritoneal cavity was confirmed. No adhesions were noted.  After determining placement for the other ports, Local anesthetic was injected at each site and two 8 mm incisions were made for robotic ports at 10 cm lateral to and at the level of the umbilical port. Two additional 8 mm incisions were made 10 cm lateral to these and 30 degrees down followed by 8 mm robotic ports - the right side for an assistant port. All trocars were placed sequentially under direct visualization of the camera. The patient was placed in Trendelenburg. The sacrum appeared to be free of any adhesive disease.The robot was docked on the patient's right side. Monopolar endoshears were placed in the right arm, a [place] bipolar grasper was placed in the 2nd arm of the patient's left side, and a Tip up grasper was placed in the 3rd arm on the patient's left side.    With a lucite probe in the vagina, the anterior peritoneum was grasped and incised. The incision was noted to be too superior as bladder muscle fibers were noted. Therefore the bladder edges were identified and another peritoneal incision was made more proximally on the vagina. Anterior vaginal dissection in the vesicovaginal space was then performed with sharp dissection and electrosurgery to the level of the urethra. The posterior vaginal dissection was then performed with sharp dissection and electrosurgery in order to dissect the rectum away from the posterior vagina. Attention was then turned to the sacral promontory. The peritoneum overlying the sacral promontory was tented up, dissected sharply with monopolar scissors and electrosurgery using layer by layer technique. The overlying areolar and  adipose tissue were taken down until the anterior longitudinal sacral ligament was identified. The peritoneal incision was extended down to the posterior cul-de-sac. This was performed with care to avoid the ureter on the right side and the sigmoid  colon and its mesentary on the left side.   Small vessels were cauterized along the way to obtain excellent hemostasis. The initial peritoneal incision over the bladder was closed with a 2-0 vloc suture. A "Y" mesh was then inserted into the abdomen after trimming to appropriate size. With the probe in the vagina, the anterior leaf of the Y mesh was affixed to the anterior portion of the vagina using a 2-0 v-loc suture in a spiral pattern to distribute the suture evenly across the surface of the anterior mesh leaf. In a similar fashion, the posterior leaf of the Y mesh was attached to the posterior surface of the vagina with 2-0 v-loc suture.  The distal end of the mesh was then brought to overlie the sacrum. The correct amount of tension was determined in order to elevate the vagina, but not put the mesh under tension. The distal end of the mesh was then affixed to the anterior longitudinal sacral ligament using three interrupted transverse stitches of CV2 Gortex. The excess distal mesh was then cut and removed. The peritoneum was reapproximated over the mesh using 2-0 monocryl. The bladder flap was incorporated to completely retroperitonealize the mesh. All pedicles were carefully inspected and noted to be hemostatic as the CO2 gas was deflated. All instruments were removed from the patient's abdomen.    The Foley catheter was removed.  A 70-degree cystoscope was introduced, and 360-degree inspection revealed no injury, lesion or foreign body in the bladder. Brisk bilateral ureteral efflux was noted with the assistance of pyridium.  The bladder was drained and the cystoscope was removed.  The Foley catheter was replaced.   The robot was undocked. The CO2 gas was removed and the ports were removed.  The skin incisions were closed with subcutaneous stitches of 4-0 Monocryl and covered with skin glue.    The patient tolerated the procedure well. Sponge, lap, and needle counts were correct x 2. She was awakened  from anesthesia and transferred to the recovery room in stable condition.   Jaquita Folds, MD

## 2022-04-20 NOTE — Transfer of Care (Signed)
Immediate Anesthesia Transfer of Care Note  Patient: Taylor Leach  Procedure(s) Performed: Procedure(s) (LRB): XI ROBOTIC ASSISTED LAPAROSCOPIC SACROCOLPOPEXY (N/A) CYSTOSCOPY (N/A)  Patient Location: PACU  Anesthesia Type: General  Level of Consciousness: awake, oriented, sedated and patient cooperative  Airway & Oxygen Therapy: Patient Spontanous Breathing and Patient connected to face mask oxygen  Post-op Assessment: Report given to PACU RN and Post -op Vital signs reviewed and stable  Post vital signs: Reviewed and stable  Complications: No apparent anesthesia complications  Last Vitals:  Vitals Value Taken Time  BP 137/62 04/20/22 1431  Temp    Pulse 64 04/20/22 1435  Resp 18 04/20/22 1435  SpO2 90 % 04/20/22 1435  Vitals shown include unvalidated device data.  Last Pain:  Vitals:   04/20/22 1004  TempSrc: Oral  PainSc: 0-No pain      Patients Stated Pain Goal: 5 (62/86/38 1771)  Complications: No notable events documented.

## 2022-04-21 ENCOUNTER — Telehealth: Payer: Self-pay | Admitting: Obstetrics and Gynecology

## 2022-04-21 ENCOUNTER — Encounter (HOSPITAL_BASED_OUTPATIENT_CLINIC_OR_DEPARTMENT_OTHER): Payer: Self-pay | Admitting: Obstetrics and Gynecology

## 2022-04-21 NOTE — Telephone Encounter (Signed)
Called patient and she endorses some soreness. States she has been taking her ibuprofen and tylenol and has had one dose of narcotic pain medication. Pain reported 4/10.  Reports she has been urinating normally. Reports she has not yet had a BM but took some Miralax.  Denies bleeding or clots. Reports some possible light urine leakage.  Overall feels she is doing well and reports she will reach out with any concerns or questions.

## 2022-04-21 NOTE — Anesthesia Postprocedure Evaluation (Signed)
Anesthesia Post Note  Patient: Taylor Leach  Procedure(s) Performed: XI ROBOTIC ASSISTED LAPAROSCOPIC SACROCOLPOPEXY (Pelvis) CYSTOSCOPY (Bladder)     Patient location during evaluation: PACU Anesthesia Type: General Level of consciousness: awake and alert Pain management: pain level controlled Vital Signs Assessment: post-procedure vital signs reviewed and stable Respiratory status: spontaneous breathing, nonlabored ventilation, respiratory function stable and patient connected to nasal cannula oxygen Cardiovascular status: blood pressure returned to baseline and stable Postop Assessment: no apparent nausea or vomiting Anesthetic complications: no   No notable events documented.  Last Vitals:  Vitals:   04/20/22 1712 04/20/22 1849  BP: (!) 132/52 129/65  Pulse: 61 67  Resp: 13 16  Temp: 36.5 C 36.6 C  SpO2: 98% 96%    Last Pain:  Vitals:   04/20/22 1859  TempSrc:   PainSc: Archie

## 2022-04-21 NOTE — Telephone Encounter (Signed)
Taylor Leach underwent robotic assisted sacrocolpopexy, cystoscopy on 04/20/22.   She passed her voiding trial.  350ml was backfilled into the bladder Voided 39ml  PVR by bladder scan was 54ml.   She was discharged without a catheter. Please call her for a routine post op check. Thanks!  Jaquita Folds, MD

## 2022-05-03 ENCOUNTER — Encounter: Payer: Self-pay | Admitting: Family Medicine

## 2022-05-03 ENCOUNTER — Ambulatory Visit (INDEPENDENT_AMBULATORY_CARE_PROVIDER_SITE_OTHER): Payer: Medicare Other | Admitting: Family Medicine

## 2022-05-03 VITALS — BP 139/78 | HR 74 | Temp 98.8°F | Ht 64.0 in | Wt 205.0 lb

## 2022-05-03 DIAGNOSIS — E785 Hyperlipidemia, unspecified: Secondary | ICD-10-CM

## 2022-05-03 DIAGNOSIS — Z0001 Encounter for general adult medical examination with abnormal findings: Secondary | ICD-10-CM

## 2022-05-03 DIAGNOSIS — L570 Actinic keratosis: Secondary | ICD-10-CM

## 2022-05-03 DIAGNOSIS — E1169 Type 2 diabetes mellitus with other specified complication: Secondary | ICD-10-CM | POA: Diagnosis not present

## 2022-05-03 DIAGNOSIS — Z0000 Encounter for general adult medical examination without abnormal findings: Secondary | ICD-10-CM

## 2022-05-03 DIAGNOSIS — K754 Autoimmune hepatitis: Secondary | ICD-10-CM | POA: Diagnosis not present

## 2022-05-03 DIAGNOSIS — E041 Nontoxic single thyroid nodule: Secondary | ICD-10-CM

## 2022-05-03 DIAGNOSIS — L309 Dermatitis, unspecified: Secondary | ICD-10-CM

## 2022-05-03 DIAGNOSIS — K760 Fatty (change of) liver, not elsewhere classified: Secondary | ICD-10-CM

## 2022-05-03 LAB — BAYER DCA HB A1C WAIVED: HB A1C (BAYER DCA - WAIVED): 6.9 % — ABNORMAL HIGH (ref 4.8–5.6)

## 2022-05-03 MED ORDER — TRIAMCINOLONE ACETONIDE 0.1 % EX CREA: 1.0000 | TOPICAL_CREAM | Freq: Two times a day (BID) | CUTANEOUS | 1 refills | Status: DC | PRN

## 2022-05-03 MED ORDER — FLUOROURACIL 5 % EX CREA: TOPICAL_CREAM | Freq: Two times a day (BID) | CUTANEOUS | 1 refills | Status: AC

## 2022-05-03 NOTE — Patient Instructions (Signed)
Thank you for coming in today for your Annual Medicare Wellness Visit.  Things that we discussed today are included in this packet. ? ?Create and/or bring a copy of your Living Will/ Advanced Directive into the office so that we may respect your wishes should an emergency occur. ? ?Get the recommended life-saving vaccines we discussed today. ? ?Get your mammogram/ colonoscopy/ DEXA scans as directed by your provider. ? ?Make sure that your medications are organized and safely stored.  Remember to always ask for help if you forget when/ how to take your medications. ? ?Make healthy food choices (Rich in fruits/ veggies/ lean meats and low in salt, sugar and fat) ? ?Do something that you enjoy for at least 30 minutes every day to stay active (walking, gardening, swimming, etc). This will help you lower your risk of falls/ broken bones. ? ?Be social, do puzzles/ crosswords.  These things help the mind stay young and lower your risk of developing dementia. ? ?Make sure that your home is safe by checking your smoke detectors regularly and doing the things outlined below to lower your risk of falls. ? ? ? ?

## 2022-05-03 NOTE — Progress Notes (Signed)
Subjective:    Taylor Leach is a 67 y.o. female who presents for a Welcome to Medicare exam.   Review of Systems Positive for some mild urinary leakage.  She is status post laparoscopic repair of vaginal prolapse.  She is doing well from that standpoint and is doing some mild physical activities.        Objective:    Today's Vitals   05/03/22 1338  BP: 139/78  Pulse: 74  Temp: 98.8 F (37.1 C)  SpO2: 96%  Weight: 205 lb (93 kg)  Height: 5\' 4"  (1.626 m)  PainSc: 0-No pain  Body mass index is 35.19 kg/m.  Medications Outpatient Encounter Medications as of 05/03/2022  Medication Sig   acetaminophen (TYLENOL) 500 MG tablet Take 1 tablet (500 mg total) by mouth every 6 (six) hours as needed (pain).   atorvastatin (LIPITOR) 10 MG tablet Take 1 tablet (10 mg total) by mouth daily.   blood glucose meter kit and supplies KIT Dispense based on patient and insurance preference. Use up to four times daily as directed. (FOR ICD-9 250.00, 250.01). E11.9   budesonide (ENTOCORT EC) 3 MG 24 hr capsule Take 3 mg by mouth daily.   cetirizine (ZYRTEC) 10 MG tablet Take by mouth as needed.   CONTOUR NEXT TEST test strip CHECK BLOOD SUGAR UP TO 4 TIMES A DAY OR AS DIRECTED   fluorouracil (EFUDEX) 5 % cream Apply topically 2 (two) times daily. X2-4 weeks   fluticasone (FLONASE) 50 MCG/ACT nasal spray Place 1 spray into both nostrils 2 (two) times daily as needed for allergies or rhinitis.   ibuprofen (ADVIL) 600 MG tablet Take 1 tablet (600 mg total) by mouth every 6 (six) hours as needed.   mycophenolate (MYFORTIC) 360 MG TBEC EC tablet Take 360 mg by mouth 2 (two) times daily.   pantoprazole (PROTONIX) 40 MG tablet Take 1 tablet (40 mg total) by mouth daily.   polyethylene glycol powder (GLYCOLAX/MIRALAX) 17 GM/SCOOP powder Take 17 g by mouth daily. Drink 17g (1 scoop) dissolved in water per day.   Semaglutide,0.25 or 0.5MG /DOS, (OZEMPIC, 0.25 OR 0.5 MG/DOSE,) 2 MG/3ML SOPN Inject 0.5 mg into  the skin every 7 (seven) days. (Patient taking differently: Inject 0.5 mg into the skin every 7 (seven) days. As of 04/19/22, patient last took Ozempic on 04/10/22. She will resume after surgery.)   traMADol (ULTRAM) 50 MG tablet Take by mouth every 6 (six) hours as needed. For after surgery   [DISCONTINUED] triamcinolone cream (KENALOG) 0.1 % Apply 1 application. topically 2 (two) times daily as needed (eczema flare for up to 7 days). To affected area on ear   triamcinolone cream (KENALOG) 0.1 % Apply 1 Application topically 2 (two) times daily as needed (eczema flare for up to 7 days). To affected area on ear   No facility-administered encounter medications on file as of 05/03/2022.     History: Past Medical History:  Diagnosis Date   Arthritis    hands   Atypical chest pain 04/14/2022   See cardiology OV note from 04/14/22 in Epic. Atypical chest pain thought to be related to a hiatal hernia.   Autoimmune hepatitis (HCC)    Follows w/ gastroenterology. LOV 12/14/21 with 02/13/22, AGNP (as of 04/18/22) in Epic.   Chest pain    Stress echo normal, October, 2012   COVID-19 03/2021   flu like symptoms, patient was prescribed an antiviral   Diabetes mellitus (HCC) 07/28/2016   Type 2, follows w/  PCP, Dr. Adam Phenix, [place] 01/28/22 (as of 0/15/24.) HgbA1c 6.8 on 01/28/22.   Dyslipidemia    Follows w/ PCP.   Ejection fraction    EF 55-60%, echo, October, 2012   GERD (gastroesophageal reflux disease)    Hiatal hernia    found during EGD   Idiopathic neuropathy    feet   NASH (nonalcoholic steatohepatitis)    chronic, follows w/ gastro, lov 12/14/21 in Epic   Wears glasses    Past Surgical History:  Procedure Laterality Date   CHOLECYSTECTOMY  2008   COLONOSCOPY  06/10/2019   multiple colon polyps   CYSTOSCOPY N/A 04/20/2022   Procedure: CYSTOSCOPY;  Surgeon: Jaquita Folds, MD;  Location: [facility];  Service: Gynecology;  Laterality: N/A;    ESOPHAGOGASTRODUODENOSCOPY     around 2018   EYE SURGERY Bilateral 07/15/2021   eye lids   LIVER BIOPSY  2005   LIVER BIOPSY  2019   steatohepatitis & moderate lymphoplasmacytic infiltrate & stage 2 -3 fibrosis   NASAL SINUS SURGERY     around 2001   [facility]   ROBOTIC ASSISTED LAPAROSCOPIC SACROCOLPOPEXY N/A 04/20/2022   Procedure: XI ROBOTIC ASSISTED LAPAROSCOPIC SACROCOLPOPEXY;  Surgeon: Jaquita Folds, MD;  Location[facility];  Service: Gynecology;  Laterality: N/A;   VAGINAL HYSTERECTOMY  1999    Family History  Problem Relation Age of Onset   Cancer Mother    Cancer Father    Lupus Daughter    Arthritis/Rheumatoid Daughter    Diabetes type II Daughter    Social History   Occupational History   Not on file  Tobacco Use   Smoking status: Former    Packs/day: 1.00    Years: 10.00    Total pack years: 10.00    Types: Cigarettes    Quit date: 1988    Years since quitting: 36.1   Smokeless tobacco: Never  Vaping Use   Vaping Use: Never used  Substance and Sexual Activity   Alcohol use: Not Currently   Drug use: No   Sexual activity: Yes    Birth control/protection: Surgical    Tobacco Counseling Counseling given: Not Answered   Immunizations and Health Maintenance Immunization History  Administered Date(s) Administered   Hep A / Hep B 03/24/2008, 04/23/2008, 09/08/2008   Influenza, Seasonal, Injecte, Preservative Fre 01/10/2008   Influenza,inj,Quad PF,6+ Mos 05/18/2016   Influenza-Unspecified 02/02/2018   PFIZER(Purple Top)SARS-COV-2 Vaccination 11/08/2019, 11/29/2019   PNEUMOCOCCAL CONJUGATE-20 10/29/2021   Pneumococcal Polysaccharide-23 11/30/2017   Zoster Recombinat (Shingrix) 12/12/2018, 06/12/2019   Health Maintenance Due  Topic Date Due   Diabetic kidney evaluation - Urine ACR  04/30/2022    Activities of Daily Living    05/03/2022    2:17 PM 04/20/2022   10:07 AM   In your present state of health, do you have any difficulty performing the following activities:  Hearing? 0 0  Vision? 0 0  Difficulty concentrating or making decisions? 0 0  Walking or climbing stairs? 0 0  Dressing or bathing? 0 0  Doing errands, shopping? 0   Preparing Food and eating ? N   Using the Toilet? N   In the past six months, have you accidently leaked urine? N   Do you have problems with loss of bowel control? N   Managing your Medications? N   Managing your Finances? N   Housekeeping or managing your Housekeeping? N  Physical Exam   Gen: Well-appearing, obese female no acute distress HEENT: Sclera white.  Moist mucous membranes. Cardio: Regular rate and rhythm.  S1-S2 heard.  No murmurs Pulm: Clear to auscultation bilaterally anteriorly.  No wheeze, rhonchi or rails MSK: Ambulating independently with normal gait and station Psych: Mood stable, speech normal, affect appropriate very pleasant and interactive     05/03/2022    1:56 PM 02/21/2022    9:32 AM 01/28/2022    8:09 AM  Depression screen PHQ 2/9  Decreased Interest 0 0 0  Down, Depressed, Hopeless 0 0 0  PHQ - 2 Score 0 0 0  Altered sleeping 0 0 0  Tired, decreased energy 0 0 0  Change in appetite 0 0 0  Feeling bad or failure about yourself  0 0 0  Trouble concentrating 0 0 0  Moving slowly or fidgety/restless 0 0 0  Suicidal thoughts 0 0 0  PHQ-9 Score 0 0 0  Difficult doing work/chores Not difficult at all Not difficult at all Not difficult at all      05/03/2022    1:56 PM 02/21/2022    9:32 AM 01/28/2022    8:10 AM 10/29/2021    8:51 AM  GAD 7 : Generalized Anxiety Score  Nervous, Anxious, on Edge 0 0 0 0  Control/stop worrying 0 0 0 0  Worry too much - different things 0 0 0 0  Trouble relaxing 0 0 0 0  Restless 0 0 0 0  Easily annoyed or irritable 0 0 0 0  Afraid - awful might happen 0 0 0 0  Total GAD 7 Score 0 0 0 0  Anxiety Difficulty Not difficult at all Not difficult at all  Not difficult at all Not difficult at all       05/03/2022    2:12 PM  MMSE - Mini Mental State Exam  Orientation to time 5  Orientation to Place 5  Registration 3  Attention/ Calculation 5  Recall 3  Language- name 2 objects 2  Language- repeat 1  Language- follow 3 step command 3  Language- read & follow direction 1  Write a sentence 1  Copy design 1  Total score 30   Advanced Directives: Does Patient Have a Medical Advance Directive?: No Would patient like information on creating a medical advance directive?: Yes (MAU/Ambulatory/Procedural Areas - Information given)    Assessment:    This is a routine wellness examination for this patient .  Vision/Hearing screen Hearing Screening - Comments:: No concerns KH/CMA Vision Screening - Comments:: Has eye apt next month 01/30 [place]  Dietary issues and exercise activities discussed:   Continue adequate water intake, fiber intake, low-carb and low-fat diet.   Goals      Exercise 150 min/wk Moderate Activity     Weight (lb) < 200 lb (90.7 kg)       Depression Screen    05/03/2022    1:56 PM 02/21/2022    9:32 AM 01/28/2022    8:09 AM 10/29/2021    8:51 AM  PHQ 2/9 Scores  PHQ - 2 Score 0 0 0 0  PHQ- 9 Score 0 0 0      Fall Risk    05/03/2022    1:56 PM  [place] in the past year? 0  Number falls in past yr: 0  Injury with Fall? 0  Risk for fall due to : No Fall Risks  Follow up Education provided  Cognitive Function:    05/03/2022    2:12 PM  MMSE - Mini Mental State Exam  Orientation to time 5  Orientation to Place 5  Registration 3  Attention/ Calculation 5  Recall 3  Language- name 2 objects 2  Language- repeat 1  Language- follow 3 step command 3  Language- read & follow direction 1  Write a sentence 1  Copy design 1  Total score 30        05/03/2022    2:18 PM  6CIT Screen  What Year? 0 points  What month? 0 points  What time? 0 points  Count back from 20 0 points   Months in reverse 0 points  Repeat phrase 0 points  Total Score 0 points    Patient Care Team: [name], DO as PCP - General (Family Medicine) Arnoldo Lenis, MD as PCP - Cardiology (Cardiology) Lavera Guise, [facility] as Pharmacist (Family Medicine) [place], Carroll Kinds, NP as Nurse Practitioner (Nurse Practitioner)     Plan:     Welcome to Medicare preventive visit - Plan: EKG 12-Lead  Type 2 diabetes mellitus with other specified complication, without long-term current use of insulin ([place]) - Plan: Bayer DCA Hb A1c Waived, Microalbumin / creatinine urine ratio  Hyperlipidemia associated with type 2 diabetes mellitus ([place]) - Plan: Hepatic function panel  Autoimmune hepatitis ([place]) - Plan: Hepatic function panel, IGG, CBC with Differential  Nonalcoholic fatty liver disease without nonalcoholic steatohepatitis (NASH) - Plan: Hepatic function panel  Thyroid nodule - Plan: US THYROID  Actinic keratoses - Plan: fluorouracil (EFUDEX) 5 % cream  Dermatitis of external ear - Plan: triamcinolone cream (KENALOG) 0.1 %  Sugar well-controlled.  Continue current regimen.  Urine microalbumin collected.  Check liver function test, IgG and CBC and will CC to her hepatologist.  Ultrasound for thyroid nodule follow-up scheduled.  If unchanged from previous, no further surveillance is needed  Requested refills on 5-FU and Kenalog.  Both of these have been sent to her pharmacy  She may follow-up in 3 months, sooner if concerns arise for routine diabetes checkup and liver function monitoring  I have personally reviewed and noted the following in the patient's chart:   Medical and social history Use of alcohol, tobacco or illicit drugs  Current medications and supplements Functional ability and status Nutritional status Physical activity Advanced directives List of other physicians Hospitalizations, surgeries, and ER visits in previous 12 months Vitals Screenings to  include cognitive, depression, and falls Referrals and appointments  In addition, I have reviewed and discussed with patient certain preventive protocols, quality metrics, and best practice recommendations. A written personalized care plan for preventive services as well as general preventive health recommendations were provided to patient.     Ronnie Doss, DO 05/03/2022

## 2022-05-04 LAB — CBC WITH DIFFERENTIAL/PLATELET
Basophils Absolute: 0 10*3/uL (ref 0.0–0.2)
Basos: 0 %
EOS (ABSOLUTE): 0.2 10*3/uL (ref 0.0–0.4)
Eos: 3 %
Hematocrit: 41.9 % (ref 34.0–46.6)
Hemoglobin: 14.3 g/dL (ref 11.1–15.9)
Immature Grans (Abs): 0.1 10*3/uL (ref 0.0–0.1)
Immature Granulocytes: 1 %
Lymphocytes Absolute: 1.4 10*3/uL (ref 0.7–3.1)
Lymphs: 19 %
MCH: 31.6 pg (ref 26.6–33.0)
MCHC: 34.1 g/dL (ref 31.5–35.7)
MCV: 93 fL (ref 79–97)
Monocytes Absolute: 0.9 10*3/uL (ref 0.1–0.9)
Monocytes: 12 %
Neutrophils Absolute: 5 10*3/uL (ref 1.4–7.0)
Neutrophils: 65 %
Platelets: 210 10*3/uL (ref 150–450)
RBC: 4.52 x10E6/uL (ref 3.77–5.28)
RDW: 12.4 % (ref 11.7–15.4)
WBC: 7.6 10*3/uL (ref 3.4–10.8)

## 2022-05-04 LAB — HEPATIC FUNCTION PANEL
ALT: 29 IU/L (ref 0–32)
AST: 21 IU/L (ref 0–40)
Albumin: 4.2 g/dL (ref 3.9–4.9)
Alkaline Phosphatase: 79 IU/L (ref 44–121)
Bilirubin Total: 0.4 mg/dL (ref 0.0–1.2)
Bilirubin, Direct: 0.12 mg/dL (ref 0.00–0.40)
Total Protein: 7.4 g/dL (ref 6.0–8.5)

## 2022-05-04 LAB — IGG: IgG (Immunoglobin G), Serum: 1771 mg/dL — ABNORMAL HIGH (ref 586–1602)

## 2022-05-05 LAB — MICROALBUMIN / CREATININE URINE RATIO
Creatinine, Urine: 136.3 mg/dL
Microalb/Creat Ratio: 19 mg/g creat (ref 0–29)
Microalbumin, Urine: 26.4 ug/mL

## 2022-05-12 ENCOUNTER — Ambulatory Visit (HOSPITAL_COMMUNITY)
Admission: RE | Admit: 2022-05-12 | Discharge: 2022-05-12 | Disposition: A | Discharge status: Home / Self Care | Payer: Medicare Other | Source: Ambulatory Visit | Attending: Family Medicine | Admitting: Family Medicine

## 2022-05-12 DIAGNOSIS — E041 Nontoxic single thyroid nodule: Secondary | ICD-10-CM | POA: Diagnosis not present

## 2022-05-16 ENCOUNTER — Ambulatory Visit: Payer: Medicare Other | Admitting: Family Medicine

## 2022-05-16 ENCOUNTER — Other Ambulatory Visit: Payer: Self-pay | Admitting: Family Medicine

## 2022-05-16 DIAGNOSIS — E041 Nontoxic single thyroid nodule: Secondary | ICD-10-CM

## 2022-05-16 NOTE — Progress Notes (Signed)
Orders Placed This Encounter  Procedures   [place] FNA BX THYROID 1ST LESION AFIRMA    Standing Status:   Future    Standing Expiration Date:   05/17/2023    Order Specific Question:   Reason for Exam (SYMPTOM  OR DIAGNOSIS REQUIRED)    Answer:   left thyroid nodule    Order Specific Question:   Preferred location?    Answer:   Internal

## 2022-05-23 ENCOUNTER — Encounter: Payer: Self-pay | Admitting: Family Medicine

## 2022-05-23 ENCOUNTER — Encounter: Payer: Self-pay | Admitting: *Deleted

## 2022-05-23 ENCOUNTER — Other Ambulatory Visit: Payer: Self-pay | Admitting: Family Medicine

## 2022-05-23 ENCOUNTER — Ambulatory Visit (HOSPITAL_COMMUNITY)
Admission: RE | Admit: 2022-05-23 | Discharge: 2022-05-23 | Disposition: A | Discharge status: Home / Self Care | Payer: Medicare Other | Source: Ambulatory Visit | Attending: Family Medicine | Admitting: Family Medicine

## 2022-05-23 ENCOUNTER — Ambulatory Visit (INDEPENDENT_AMBULATORY_CARE_PROVIDER_SITE_OTHER): Payer: Medicare Other | Admitting: Family Medicine

## 2022-05-23 VITALS — BP 129/68 | HR 75 | Temp 98.1°F | Ht 64.0 in | Wt 202.1 lb

## 2022-05-23 DIAGNOSIS — R195 Other fecal abnormalities: Secondary | ICD-10-CM | POA: Diagnosis present

## 2022-05-23 DIAGNOSIS — R1114 Bilious vomiting: Secondary | ICD-10-CM

## 2022-05-23 DIAGNOSIS — R1011 Right upper quadrant pain: Secondary | ICD-10-CM

## 2022-05-23 DIAGNOSIS — R051 Acute cough: Secondary | ICD-10-CM

## 2022-05-23 DIAGNOSIS — J029 Acute pharyngitis, unspecified: Secondary | ICD-10-CM

## 2022-05-23 DIAGNOSIS — K760 Fatty (change of) liver, not elsewhere classified: Secondary | ICD-10-CM

## 2022-05-23 DIAGNOSIS — R10811 Right upper quadrant abdominal tenderness: Secondary | ICD-10-CM | POA: Insufficient documentation

## 2022-05-23 DIAGNOSIS — K754 Autoimmune hepatitis: Secondary | ICD-10-CM

## 2022-05-23 LAB — RAPID STREP SCREEN (MED CTR MEBANE ONLY): Strep Gp A Ag, IA W/Reflex: NEGATIVE

## 2022-05-23 LAB — CULTURE, GROUP A STREP

## 2022-05-23 NOTE — Progress Notes (Signed)
Acute Office Visit  Subjective:     Patient ID: Taylor Leach, female    DOB: 1955/04/28, 67 y.o.   MRN: [id number]  Chief Complaint  Patient presents with   Nausea   Stool Color Change    HPI Patient is in today for RUQ abdominal pain, bloating, nausea, vomiting, and diarrhea that started 1 week ago. She also reports vomiting was yellow. Reprots cotinued intermitent nausea since then. No emesis for the last 5 days. Diarrhea has improved. She is now having soft, small stools.  She reports stools are white in color for the last 4 days.  Also has decreased appetite. She had stopped her ozempic 0.25 mg for a few weeks for surgery and restarted this last week. Reprots that she had mild nasuea when she started ozempic initially but did not have side effects that were this severe.   She also reports a sore throat that started 2 days ago. She also reprots mild cough, chills, fatigue, and postnasal drip. Denies fever, chest pain, shortness of breath, myalgias. Denies exposure to strep.   She has history of autoimmune hepatitis and fatty liver disease.  She is established with a liver specialist at [place]. Hx of gallbladder removal. Labs on 05/03/22 with normal LFTs.   ROS As per HPI.      Objective:    BP 129/68   Pulse 75   Temp 98.1 F (36.7 C) (Temporal)   Ht 5' 4"$  (1.626 m)   Wt 202 lb 2 oz (91.7 kg)   SpO2 94%   BMI 34.69 kg/m    Physical Exam Vitals and nursing note reviewed.  Constitutional:      General: She is not in acute distress.    Appearance: She is obese. She is not ill-appearing, toxic-appearing or diaphoretic.  HENT:     Right Ear: Ear canal and external ear normal.     Left Ear: Ear canal and external ear normal.     Nose: Nose normal.     Mouth/Throat:     Mouth: Mucous membranes are moist.     Pharynx: Posterior oropharyngeal erythema present. No pharyngeal swelling or oropharyngeal exudate.     Tonsils: No tonsillar exudate. 1+ on the right. 1+ on the  left.  Eyes:     General: No scleral icterus.       Right eye: No discharge.        Left eye: No discharge.  Cardiovascular:     Rate and Rhythm: Normal rate and regular rhythm.     Heart sounds: Normal heart sounds. No murmur heard. Pulmonary:     Effort: Pulmonary effort is normal. No respiratory distress.     Breath sounds: Normal breath sounds.  Abdominal:     General: There is no distension.     Palpations: Abdomen is soft.     Tenderness: There is abdominal tenderness in the right upper quadrant. There is no guarding or rebound.  Musculoskeletal:     Right lower leg: No edema.     Left lower leg: No edema.  Skin:    General: Skin is warm.     Coloration: Skin is not jaundiced.  Neurological:     General: No focal deficit present.     Mental Status: She is alert and oriented to person, place, and time.  Psychiatric:        Mood and Affect: Mood normal.        Behavior: Behavior normal.  No results found for any visits on 05/23/22.      Assessment & Plan:   [name] was seen today for nausea and stool color change.  Diagnoses and all orders for this visit:  RUQ pain -     US Abdomen Limited RUQ (LIVER/GB); Future -     CMP14+EGFR -     CBC with Differential/Platelet  Right upper quadrant abdominal tenderness without rebound tenderness -     US Abdomen Limited RUQ (LIVER/GB); Future -     CMP14+EGFR -     CBC with Differential/Platelet  Nonalcoholic fatty liver disease without nonalcoholic steatohepatitis (NASH)  Bilious vomiting with nausea -     US Abdomen Limited RUQ (LIVER/GB); Future  Clay-colored stools -     US Abdomen Limited RUQ (LIVER/GB); Future  Autoimmune hepatitis ([place])  Sore throat -     COVID-19, Flu A+B and RSV -     Rapid Strep Screen ([facility] ONLY)  Acute cough -     COVID-19, Flu A+B and RSV -     Rapid Strep Screen ([facility] ONLY)   RUQ pain, tenderness, with bilious vomiting and clay colored stools. Hx of  autoimmune hepatitis and NASH. Reviewed labs from 04/27/22. STAT RUQ [place] ordered today. CBC and CMP pending. Negative strep. Flu/Covid/RSV panel pending. Discussed symptomatic care and return precautions. Discussed case with Dr. Lajuana Ripple who agrees with plan. Hold ozempic for now, until symptoms improve. Will notify patient of results when available. Return to office for new or worsening symptoms, or if symptoms persist.   Total time spent caring for the patient today was 52 minutes. This includes time spent before the visit reviewing the chart, time spent during the visit, and time spent after the visit on documentation   The patient indicates understanding of these issues and agrees with the plan.  Gwenlyn Perking, FNP

## 2022-05-24 ENCOUNTER — Ambulatory Visit (HOSPITAL_COMMUNITY)
Admission: RE | Admit: 2022-05-24 | Discharge: 2022-05-24 | Disposition: A | Discharge status: Home / Self Care | Payer: Medicare Other | Source: Ambulatory Visit | Attending: Family Medicine | Admitting: Family Medicine

## 2022-05-24 DIAGNOSIS — R195 Other fecal abnormalities: Secondary | ICD-10-CM | POA: Insufficient documentation

## 2022-05-24 DIAGNOSIS — R1114 Bilious vomiting: Secondary | ICD-10-CM | POA: Diagnosis present

## 2022-05-24 DIAGNOSIS — R10811 Right upper quadrant abdominal tenderness: Secondary | ICD-10-CM | POA: Diagnosis not present

## 2022-05-24 LAB — CBC WITH DIFFERENTIAL/PLATELET
Basophils Absolute: 0 10*3/uL (ref 0.0–0.2)
Basos: 0 %
EOS (ABSOLUTE): 0 10*3/uL (ref 0.0–0.4)
Eos: 1 %
Hematocrit: 41.9 % (ref 34.0–46.6)
Hemoglobin: 14.7 g/dL (ref 11.1–15.9)
Immature Grans (Abs): 0 10*3/uL (ref 0.0–0.1)
Immature Granulocytes: 0 %
Lymphocytes Absolute: 1.2 10*3/uL (ref 0.7–3.1)
Lymphs: 15 %
MCH: 32.2 pg (ref 26.6–33.0)
MCHC: 35.1 g/dL (ref 31.5–35.7)
MCV: 92 fL (ref 79–97)
Monocytes Absolute: 0.9 10*3/uL (ref 0.1–0.9)
Monocytes: 11 %
Neutrophils Absolute: 6.1 10*3/uL (ref 1.4–7.0)
Neutrophils: 73 %
Platelets: 146 10*3/uL — ABNORMAL LOW (ref 150–450)
RBC: 4.57 x10E6/uL (ref 3.77–5.28)
RDW: 12.2 % (ref 11.7–15.4)
WBC: 8.2 10*3/uL (ref 3.4–10.8)

## 2022-05-24 LAB — CMP14+EGFR
ALT: 33 IU/L — ABNORMAL HIGH (ref 0–32)
AST: 23 IU/L (ref 0–40)
Albumin/Globulin Ratio: 1.3 (ref 1.2–2.2)
Albumin: 4 g/dL (ref 3.9–4.9)
Alkaline Phosphatase: 64 IU/L (ref 44–121)
BUN/Creatinine Ratio: 10 — ABNORMAL LOW (ref 12–28)
BUN: 11 mg/dL (ref 8–27)
Bilirubin Total: 0.5 mg/dL (ref 0.0–1.2)
CO2: 22 mmol/L (ref 20–29)
Calcium: 9.2 mg/dL (ref 8.7–10.3)
Chloride: 101 mmol/L (ref 96–106)
Creatinine, Ser: 1.1 mg/dL — ABNORMAL HIGH (ref 0.57–1.00)
Globulin, Total: 3.1 g/dL (ref 1.5–4.5)
Glucose: 128 mg/dL — ABNORMAL HIGH (ref 70–99)
Potassium: 3.6 mmol/L (ref 3.5–5.2)
Sodium: 138 mmol/L (ref 134–144)
Total Protein: 7.1 g/dL (ref 6.0–8.5)
eGFR: 55 mL/min/{1.73_m2} — ABNORMAL LOW (ref >59)

## 2022-05-24 LAB — COVID-19, FLU A+B AND RSV
Influenza A, NAA: NOT DETECTED
Influenza B, NAA: NOT DETECTED
RSV, NAA: NOT DETECTED
SARS-CoV-2, NAA: NOT DETECTED

## 2022-05-24 MED ORDER — IOHEXOL 300 MG/ML  SOLN
80.0000 mL | Freq: Once | INTRAMUSCULAR | Status: AC | PRN
  Administered 2022-05-24: 80 mL via INTRAVENOUS

## 2022-05-25 ENCOUNTER — Ambulatory Visit (HOSPITAL_COMMUNITY): Payer: Medicare Other

## 2022-05-26 ENCOUNTER — Ambulatory Visit (HOSPITAL_COMMUNITY)
Admission: RE | Admit: 2022-05-26 | Discharge: 2022-05-26 | Disposition: A | Discharge status: Home / Self Care | Payer: Medicare Other | Source: Ambulatory Visit | Attending: Family Medicine | Admitting: Family Medicine

## 2022-05-27 ENCOUNTER — Ambulatory Visit
Admission: RE | Admit: 2022-05-27 | Discharge: 2022-05-27 | Disposition: A | Discharge status: Home / Self Care | Payer: Medicare Other | Source: Ambulatory Visit | Attending: Nurse Practitioner | Admitting: Nurse Practitioner

## 2022-05-27 VITALS — BP 167/88 | HR 96 | Temp 98.7°F | Resp 20

## 2022-05-27 DIAGNOSIS — J209 Acute bronchitis, unspecified: Secondary | ICD-10-CM

## 2022-05-27 MED ORDER — AMOXICILLIN-POT CLAVULANATE 875-125 MG PO TABS: 1.0000 | ORAL_TABLET | Freq: Two times a day (BID) | ORAL | 0 refills | Status: DC

## 2022-05-27 MED ORDER — ALBUTEROL SULFATE HFA 108 (90 BASE) MCG/ACT IN AERS: 1.0000 | INHALATION_SPRAY | Freq: Four times a day (QID) | RESPIRATORY_TRACT | 0 refills | Status: DC | PRN

## 2022-05-27 MED ORDER — PSEUDOEPH-BROMPHEN-DM 30-2-10 MG/5ML PO SYRP: 5.0000 mL | ORAL_SOLUTION | Freq: Four times a day (QID) | ORAL | 0 refills | Status: DC | PRN

## 2022-05-27 NOTE — ED Provider Notes (Signed)
[facility]    CSN: [id number] Arrival date & time: 05/27/22  1037      History   Chief Complaint Chief Complaint  Patient presents with  . Cough    Bad cough feel real bad - Entered by patient    HPI Taylor Leach is a 67 y.o. female.   HPI  She is in today for productive cough with green mucous and sore throat for 3 days. She has tested negative for virus. She is in today for worsening symptoms. She has taken Mucinex day and night with not much relief. She reports that her symptom are worse at night.  Past Medical History:  Diagnosis Date  . Arthritis    hands  . Atypical chest pain 04/14/2022   See cardiology OV note from 04/14/22 in [place]. Atypical chest pain thought to be related to a hiatal hernia.  . Autoimmune hepatitis ([place])    Follows w/ gastroenterology. LOV 12/14/21 with Charisse Klinefelter, AGNP (as of 04/18/22) in Epic.  . Chest pain    Stress echo normal, October, 2012  . COVID-19 03/2021   flu like symptoms, patient was prescribed an antiviral  . Diabetes mellitus ([place]) 07/28/2016   Type 2, follows w/ PCP, Dr. Adam Phenix, [place] 01/28/22 (as of 0/15/24.) HgbA1c 6.8 on 01/28/22.  Marland Kitchen Dyslipidemia    Follows w/ PCP.  Marland Kitchen Ejection fraction    EF 55-60%, echo, October, 2012  . GERD (gastroesophageal reflux disease)   . Hiatal hernia    found during EGD  . Idiopathic neuropathy    feet  . NASH (nonalcoholic steatohepatitis)    chronic, follows w/ gastro, lov 12/14/21 in Epic  . Wears glasses     Patient Active Problem List   Diagnosis Date Noted  . Vaginal vault prolapse after hysterectomy 04/20/2022  . Nonalcoholic fatty liver disease without nonalcoholic steatohepatitis (NASH) 08/08/2018  . Pain of right hip joint pain 11/30/2017  . Diabetes mellitus ([place]) 07/28/2016  . Thyroid nodule 07/28/2016  . Pure hypercholesterolemia 05/18/2016  . Chronic allergic rhinitis due to pollen 05/18/2016  . Autoimmune hepatitis ([place])   . Hyperlipidemia  associated with type 2 diabetes mellitus ([place]) 09/04/2008  . Hypertension associated with diabetes ([place]) 09/04/2008  . Gastroesophageal reflux disease without esophagitis 09/04/2008  . HIATAL HERNIA 09/04/2008    Past Surgical History:  Procedure Laterality Date  . CHOLECYSTECTOMY  2008  . COLONOSCOPY  06/10/2019   multiple colon polyps  . CYSTOSCOPY N/A 04/20/2022   Procedure: CYSTOSCOPY;  Surgeon: Jaquita Folds, MD;  Location: [facility];  Service: Gynecology;  Laterality: N/A;  . ESOPHAGOGASTRODUODENOSCOPY     around 2018  . EYE SURGERY Bilateral 07/15/2021   eye lids  . LIVER BIOPSY  2005  . LIVER BIOPSY  2019   steatohepatitis & moderate lymphoplasmacytic infiltrate & stage 2 -3 fibrosis  . NASAL SINUS SURGERY     around 2001  . RECTOCELE REPAIR  2014   Dr. Alger  . ROBOTIC ASSISTED LAPAROSCOPIC SACROCOLPOPEXY N/A 04/20/2022   Procedure: XI ROBOTIC ASSISTED LAPAROSCOPIC SACROCOLPOPEXY;  Surgeon: Jaquita Folds, MD;  Location: [facility];  Service: Gynecology;  Laterality: N/A;  . VAGINAL HYSTERECTOMY  1999    OB History     Gravida  2   Para  2   Term  2   Preterm      AB      Living  2  SAB      IAB      Ectopic      Multiple      Live Births  2            Home Medications    Prior to Admission medications   Medication Sig Start Date End Date Taking? Authorizing Provider  acetaminophen (TYLENOL) 500 MG tablet Take 1 tablet (500 mg total) by mouth every 6 (six) hours as needed (pain). 04/06/22   Jaquita Folds, MD  atorvastatin (LIPITOR) 10 MG tablet Take 1 tablet (10 mg total) by mouth daily. 10/29/21   [name], DO  blood glucose meter kit and supplies KIT Dispense based on patient and insurance preference. Use up to four times daily as directed. (FOR ICD-9 250.00, 250.01). E11.9 05/16/17   Terald Sleeper, PA-C  budesonide (ENTOCORT EC) 3 MG 24 hr  capsule Take 3 mg by mouth daily. 06/13/18   [provider]  cetirizine (ZYRTEC) 10 MG tablet Take by mouth as needed.    [provider]  CONTOUR NEXT TEST test strip CHECK BLOOD SUGAR UP TO 4 TIMES A DAY OR AS DIRECTED 06/21/17   Terald Sleeper, PA-C  fluorouracil (EFUDEX) 5 % cream Apply topically 2 (two) times daily. X2-4 weeks 05/03/22   Ronnie Doss M, DO  fluticasone (FLONASE) 50 MCG/ACT nasal spray Place 1 spray into both nostrils 2 (two) times daily as needed for allergies or rhinitis. 10/29/21   [name], DO  ibuprofen (ADVIL) 600 MG tablet Take 1 tablet (600 mg total) by mouth every 6 (six) hours as needed. 04/06/22   Jaquita Folds, MD  mycophenolate (MYFORTIC) 360 MG TBEC EC tablet Take 360 mg by mouth 2 (two) times daily. 10/19/19   [provider]  pantoprazole (PROTONIX) 40 MG tablet Take 1 tablet (40 mg total) by mouth daily. 10/29/21   [name], DO  polyethylene glycol powder (GLYCOLAX/MIRALAX) 17 GM/SCOOP powder Take 17 g by mouth daily. Drink 17g (1 scoop) dissolved in water per day. 04/06/22   Jaquita Folds, MD  Semaglutide,0.25 or 0.'5MG'$ /DOS, (OZEMPIC, 0.25 OR 0.5 MG/DOSE,) 2 MG/3ML SOPN Inject 0.5 mg into the skin every 7 (seven) days. Patient taking differently: Inject 0.5 mg into the skin every 7 (seven) days. As of 04/19/22, patient last took [place] on 04/10/22. She will resume after surgery. 10/29/21   [name], DO  triamcinolone cream (KENALOG) 0.1 % Apply 1 Application topically 2 (two) times daily as needed (eczema flare for up to 7 days). To affected area on ear 05/03/22   [name], DO    Family History Family History  Problem Relation Age of Onset  . Cancer Mother   . Cancer Father   . Lupus Daughter   . Arthritis/Rheumatoid Daughter   . Diabetes type II Daughter     Social History Social History   Tobacco Use  . Smoking status: Former    Packs/day: 1.00    Years: 10.00    Total  pack years: 10.00    Types: Cigarettes    Quit date: 1988    Years since quitting: 36.1  . Smokeless tobacco: Never  Vaping Use  . Vaping Use: Never used  Substance Use Topics  . Alcohol use: Not Currently  . Drug use: No     Allergies   Fenofibrate micronized, Glipizide, Codeine, Farxiga [dapagliflozin], Hydrocodone, Janumet [sitagliptin-metformin hcl], and Tape   Review of Systems Review of Systems  Physical Exam Triage Vital Signs ED Triage Vitals  Enc Vitals Group     BP 05/27/22 1047 (!) 167/88     Pulse Rate 05/27/22 1047 96     Resp 05/27/22 1047 20     Temp 05/27/22 1047 98.7 F (37.1 C)     Temp Source 05/27/22 1047 Oral     SpO2 05/27/22 1047 95 %     Weight --      Height --      Head Circumference --      Peak Flow --      Pain Score 05/27/22 1049 0     Pain Loc --      Pain Edu? --      Excl. in [place]? --    No data found.  Updated Vital Signs BP (!) 167/88 (BP Location: Right Arm)   Pulse 96   Temp 98.7 F (37.1 C) (Oral)   Resp 20   SpO2 95%   Visual Acuity Right Eye Distance:   Left Eye Distance:   Bilateral Distance:    Right Eye Near:   Left Eye Near:    Bilateral Near:     Physical Exam Constitutional:      Appearance: She is obese.  HENT:     Head: Normocephalic and atraumatic.     Right Ear: Tympanic membrane normal.     Left Ear: Tympanic membrane normal.     Nose: Nose normal.     Mouth/Throat:     Mouth: Mucous membranes are moist.  Eyes:     Pupils: Pupils are equal, round, and reactive to light.  Cardiovascular:     Rate and Rhythm: Normal rate and regular rhythm.     Pulses: Normal pulses.     Heart sounds: Normal heart sounds.  Pulmonary:     Effort: Pulmonary effort is normal.     Breath sounds: No wheezing, rhonchi or rales.     Comments: Diminished  Deep cough audible  Skin:    General: Skin is warm and dry.     Capillary Refill: Capillary refill takes less than 2 seconds.  Neurological:     General: No  focal deficit present.     Mental Status: She is alert and oriented to person, place, and time.  Psychiatric:        Mood and Affect: Mood normal.        Behavior: Behavior normal.    UC Treatments / Results  Labs (all labs ordered are listed, but only abnormal results are displayed) Labs Reviewed - No data to display  EKG   Radiology No results found.  Procedures Procedures (including critical care time)  Medications Ordered in UC Medications - No data to display  Initial Impression / Assessment and Plan / UC Course  I have reviewed the triage vital signs and the nursing notes.  Pertinent labs & imaging results that were available during my care of the patient were reviewed by me and considered in my medical decision making (see chart for details).     cough Final Clinical Impressions(s) / UC Diagnoses   Final diagnoses:  None   Discharge Instructions   None    ED Prescriptions   None    PDMP not reviewed this encounter.   Dionisio David [place], [place] 05/27/22 1157

## 2022-05-27 NOTE — ED Triage Notes (Signed)
Pt reports she has a sore throat, green drainage, bad cough x 3 days. Went to PCP and they tested her for strep, rsv, covid, and flu x 4 days.

## 2022-05-27 NOTE — Discharge Instructions (Addendum)
You have been prescribed Augmentin 875 mg twice a day for 7 days continue the Mucinex as directed, Brompheniramine-pseudoephedrine-DM for the cough as directed You also have been prescribed the albuterol inhaler for shortness of breath as needed .

## 2022-06-01 ENCOUNTER — Encounter: Payer: Self-pay | Admitting: Obstetrics and Gynecology

## 2022-06-01 ENCOUNTER — Ambulatory Visit (INDEPENDENT_AMBULATORY_CARE_PROVIDER_SITE_OTHER): Payer: Medicare Other | Admitting: Obstetrics and Gynecology

## 2022-06-01 VITALS — BP 137/84 | HR 78

## 2022-06-01 DIAGNOSIS — Z9889 Other specified postprocedural states: Secondary | ICD-10-CM

## 2022-06-01 DIAGNOSIS — N393 Stress incontinence (female) (male): Secondary | ICD-10-CM

## 2022-06-01 NOTE — Progress Notes (Signed)
[facility]  Date of Visit: 06/01/2022  History of Present Illness: Ms. Kanak is a 67 y.o. female scheduled today for a post-operative visit.   Surgery: s/p Robotic assisted sacrocolpopexy Erenest Blank Lite Y), cystoscopy on 04/20/22  She passed her postoperative void trial.   Postoperative course has been uncomplicated.   Today she reports she did recently have bronchitis. When she has a coughing spell she has leakage- usually when bladder is more full. Most of the time her liner is dry.  Urodynamics prior to surgery did demonstrate SUI but she also had a PVR of 457m.   UTI in the last 6 weeks? No  Pain? No  She has returned to her normal activity (except for postop restrictions) Vaginal bulge? No  Stress incontinence: Yes  Urgency/frequency: No  Urge incontinence: No  Voiding dysfunction:  sometimes she feels there is a little left over in the bladder.  Bowel issues:  has been taking the miralax and that has been keeping her regular  Subjective Success: Do you usually have a bulge or something falling out that you can see or feel in the vaginal area? No  Retreatment Success: Any retreatment with surgery or pessary for any compartment? No    Medications: She has a current medication list which includes the following prescription(s): acetaminophen, albuterol, amoxicillin-clavulanate, atorvastatin, blood glucose meter kit and supplies, brompheniramine-pseudoephedrine-dm, budesonide, cetirizine, contour next test, fluorouracil, fluticasone, ibuprofen, mycophenolate, pantoprazole, polyethylene glycol powder, ozempic (0.25 or 0.5 mg/dose), and triamcinolone cream.   Allergies: Patient is allergic to fenofibrate micronized, glipizide, codeine, farxiga [dapagliflozin], hydrocodone, janumet [sitagliptin-metformin hcl], and tape.   Physical Exam: BP 137/84   Pulse 78   Abdomen: soft, non-tender, without masses or organomegaly Laparoscopic Incisions: healing well.  Pelvic  Examination: Vagina: No sutures or granulation tissue present No tenderness along the anterior or posterior vagina. No apical tenderness. No pelvic masses. No visible or palpable mesh.  POP-Q: POP-Q  -3                                            Aa   -3                                           Ba  -8                                              C   3                                            Gh  3.5                                            Pb  8  tvl   -3                                            Ap  -3                                            Bp                                                 D     ---------------------------------------------------------  Assessment and Plan:  1. Post-operative state   2. SUI (stress urinary incontinence, female)     - Healing well. - Can resume regular activity including exercise and intercourse,  if desired.  - Discussed avoidance of heavy lifting and straining long term to reduce the risk of recurrence.  - For SUI symptoms, we discussed option of sling vs urethral bulking but would want to repeat urodynamics first to ensure improved bladder emptying. She reports that she has to have a biopsy soon so wants to focus on that and give the surgery longer to heal. She will notify us if she wants to proceed with urodynamics.   Jaquita Folds, MD

## 2022-06-02 ENCOUNTER — Encounter (HOSPITAL_COMMUNITY): Payer: Self-pay

## 2022-06-02 ENCOUNTER — Ambulatory Visit (HOSPITAL_COMMUNITY)
Admission: RE | Admit: 2022-06-02 | Discharge: 2022-06-02 | Disposition: A | Discharge status: Home / Self Care | Payer: Medicare Other | Source: Ambulatory Visit | Attending: Family Medicine | Admitting: Family Medicine

## 2022-06-02 DIAGNOSIS — E041 Nontoxic single thyroid nodule: Secondary | ICD-10-CM | POA: Diagnosis present

## 2022-06-02 MED ORDER — LIDOCAINE HCL (PF) 2 % IJ SOLN
INTRAMUSCULAR | Status: AC
  Administered 2022-06-02: 10 mL
  Filled 2022-06-02: qty 10

## 2022-06-02 NOTE — Progress Notes (Signed)
PT tolerated thyroid biopsy procedure well today. Labs and afirma obtained and sent for pathology at our lab by [facility] '@[id number]'$ . PT ambulatory at discharge with no acute distress noted and verbalized understanding of discharge instructions.

## 2022-06-06 ENCOUNTER — Emergency Department (HOSPITAL_COMMUNITY): Payer: Medicare Other

## 2022-06-06 ENCOUNTER — Emergency Department (HOSPITAL_COMMUNITY)
Admission: EM | Admit: 2022-06-06 | Discharge: 2022-06-06 | Disposition: A | Discharge status: Home / Self Care | Payer: Medicare Other | Attending: Emergency Medicine | Admitting: Emergency Medicine

## 2022-06-06 ENCOUNTER — Other Ambulatory Visit: Payer: Self-pay

## 2022-06-06 ENCOUNTER — Encounter (HOSPITAL_COMMUNITY): Payer: Self-pay | Admitting: Radiology

## 2022-06-06 DIAGNOSIS — R42 Dizziness and giddiness: Secondary | ICD-10-CM | POA: Insufficient documentation

## 2022-06-06 DIAGNOSIS — Z1152 Encounter for screening for COVID-19: Secondary | ICD-10-CM | POA: Diagnosis not present

## 2022-06-06 DIAGNOSIS — R5383 Other fatigue: Secondary | ICD-10-CM | POA: Diagnosis not present

## 2022-06-06 LAB — CBC
HCT: 43.3 % (ref 36.0–46.0)
Hemoglobin: 14.3 g/dL (ref 12.0–15.0)
MCH: 31.8 pg (ref 26.0–34.0)
MCHC: 33 g/dL (ref 30.0–36.0)
MCV: 96.4 fL (ref 80.0–100.0)
Platelets: 159 10*3/uL (ref 150–400)
RBC: 4.49 MIL/uL (ref 3.87–5.11)
RDW: 12.6 % (ref 11.5–15.5)
WBC: 4.2 10*3/uL (ref 4.0–10.5)
nRBC: 0 % (ref 0.0–0.2)

## 2022-06-06 LAB — TSH: TSH: 1.747 u[IU]/mL (ref 0.350–4.500)

## 2022-06-06 LAB — TROPONIN I (HIGH SENSITIVITY)
Troponin I (High Sensitivity): 6 ng/L (ref <18)
Troponin I (High Sensitivity): 5 ng/L (ref <18)

## 2022-06-06 LAB — BASIC METABOLIC PANEL
Anion gap: 7 (ref 5–15)
BUN: 10 mg/dL (ref 8–23)
CO2: 27 mmol/L (ref 22–32)
Calcium: 9.1 mg/dL (ref 8.9–10.3)
Chloride: 101 mmol/L (ref 98–111)
Creatinine, Ser: 0.96 mg/dL (ref 0.44–1.00)
GFR, Estimated: >60 mL/min (ref >60)
Glucose, Bld: 120 mg/dL — ABNORMAL HIGH (ref 70–99)
Potassium: 3.3 mmol/L — ABNORMAL LOW (ref 3.5–5.1)
Sodium: 135 mmol/L (ref 135–145)

## 2022-06-06 LAB — URINALYSIS, ROUTINE W REFLEX MICROSCOPIC
Bilirubin Urine: NEGATIVE
Glucose, UA: NEGATIVE mg/dL
Hgb urine dipstick: NEGATIVE
Ketones, ur: NEGATIVE mg/dL
Leukocytes,Ua: NEGATIVE
Nitrite: NEGATIVE
Protein, ur: NEGATIVE mg/dL
Specific Gravity, Urine: 1.003 — ABNORMAL LOW (ref 1.005–1.030)
pH: 7 (ref 5.0–8.0)

## 2022-06-06 LAB — RESP PANEL BY RT-PCR (RSV, FLU A&B, COVID)  RVPGX2
Influenza A by PCR: NEGATIVE
Influenza B by PCR: NEGATIVE
Resp Syncytial Virus by PCR: NEGATIVE
SARS Coronavirus 2 by RT PCR: NEGATIVE

## 2022-06-06 LAB — CBG MONITORING, ED: Glucose-Capillary: 152 mg/dL — ABNORMAL HIGH (ref 70–99)

## 2022-06-06 MED ORDER — SODIUM CHLORIDE 0.9 % IV BOLUS
500.0000 mL | Freq: Once | INTRAVENOUS | Status: AC
  Administered 2022-06-06: 500 mL via INTRAVENOUS

## 2022-06-06 NOTE — ED Notes (Signed)
Pt ambulated to restroom with steady gait.

## 2022-06-06 NOTE — ED Triage Notes (Signed)
Pt states she started feeling dizzy around 11:30am after taking a walk . Pt went to sleep and husband states that her lips turned blue and it was difficulty to wake her up.  On arrival pt seems very weak and BP is 86/63

## 2022-06-06 NOTE — ED Provider Notes (Signed)
[facility] Provider Note   CSN: [id number] Arrival date & time: 06/06/22  1625     History {Add pertinent medical, surgical, social history, OB history to HPI:1} No chief complaint on file.   Taylor Leach is a 67 y.o. female.  She is brought in by her husband for evaluation of acute dizziness lightheadedness weakness that started around 11:30 AM.  She said she had done her morning chores felt fine and then acutely felt dizzy and generally weak all over.  She felt there was tingling in her face and weakness in her arms.  She felt very tired and took a nap in the recliner.  Husband felt like her lips were blue and difficult to wake her up.  She says her symptoms are ongoing at this time.  No prior history of same.  She said she had a thyroid biopsy last week.  No recent illness no cough vomiting diarrhea urinary symptoms.  No headache blurry vision double vision.  The history is provided by the patient and the spouse.  Dizziness Quality:  Lightheadedness, imbalance and head spinning Severity:  Severe Onset quality:  Sudden Duration:  6 hours Timing:  Constant Progression:  Unchanged Chronicity:  New Relieved by:  Nothing Worsened by:  Movement Ineffective treatments:  Lying down Associated symptoms: no chest pain, no diarrhea, no headaches, no hearing loss, no nausea, no shortness of breath, no syncope, no tinnitus, no vision changes and no vomiting        Home Medications Prior to Admission medications   Medication Sig Start Date End Date Taking? Authorizing Provider  acetaminophen (TYLENOL) 500 MG tablet Take 1 tablet (500 mg total) by mouth every 6 (six) hours as needed (pain). 04/06/22   Jaquita Folds, MD  albuterol (VENTOLIN HFA) 108 (90 Base) MCG/ACT inhaler Inhale 1-2 puffs into the lungs every 6 (six) hours as needed for wheezing or shortness of breath. 05/27/22   Vevelyn Francois, NP  amoxicillin-clavulanate (AUGMENTIN)  875-125 MG tablet Take 1 tablet by mouth every 12 (twelve) hours. 05/27/22   Vevelyn Francois, NP  atorvastatin (LIPITOR) 10 MG tablet Take 1 tablet (10 mg total) by mouth daily. 10/29/21   [name], DO  blood glucose meter kit and supplies KIT Dispense based on patient and insurance preference. Use up to four times daily as directed. (FOR ICD-9 250.00, 250.01). E11.9 05/16/17   Terald Sleeper, PA-C  brompheniramine-pseudoephedrine-DM 30-2-10 MG/5ML syrup Take 5 mLs by mouth 4 (four) times daily as needed. 05/27/22   Vevelyn Francois, NP  budesonide (ENTOCORT EC) 3 MG 24 hr capsule Take 3 mg by mouth daily. 06/13/18   [provider]  cetirizine (ZYRTEC) 10 MG tablet Take by mouth as needed.    [provider]  CONTOUR NEXT TEST test strip CHECK BLOOD SUGAR UP TO 4 TIMES A DAY OR AS DIRECTED 06/21/17   Terald Sleeper, PA-C  fluorouracil (EFUDEX) 5 % cream Apply topically 2 (two) times daily. X2-4 weeks 05/03/22   Ronnie Doss M, DO  fluticasone (FLONASE) 50 MCG/ACT nasal spray Place 1 spray into both nostrils 2 (two) times daily as needed for allergies or rhinitis. 10/29/21   [name], DO  ibuprofen (ADVIL) 600 MG tablet Take 1 tablet (600 mg total) by mouth every 6 (six) hours as needed. 04/06/22   Jaquita Folds, MD  mycophenolate (MYFORTIC) 360 MG TBEC EC tablet Take 360 mg by mouth 2 (  two) times daily. 10/19/19   [provider]  pantoprazole (PROTONIX) 40 MG tablet Take 1 tablet (40 mg total) by mouth daily. 10/29/21   [name], DO  polyethylene glycol powder (GLYCOLAX/MIRALAX) 17 GM/SCOOP powder Take 17 g by mouth daily. Drink 17g (1 scoop) dissolved in water per day. 04/06/22   Jaquita Folds, MD  Semaglutide,0.25 or 0.'5MG'$ /DOS, (OZEMPIC, 0.25 OR 0.5 MG/DOSE,) 2 MG/3ML SOPN Inject 0.5 mg into the skin every 7 (seven) days. Patient taking differently: Inject 0.5 mg into the skin every 7 (seven) days. As of 04/19/22, patient last took  [place] on 04/10/22. She will resume after surgery. 10/29/21   [name], DO  triamcinolone cream (KENALOG) 0.1 % Apply 1 Application topically 2 (two) times daily as needed (eczema flare for up to 7 days). To affected area on ear 05/03/22   Ronnie Doss M, DO      Allergies    Fenofibrate micronized, Glipizide, Codeine, Farxiga [dapagliflozin], Hydrocodone, Janumet [sitagliptin-metformin hcl], and Tape    Review of Systems   Review of Systems  Constitutional:  Positive for fatigue.  HENT:  Negative for hearing loss and tinnitus.   Eyes:  Negative for visual disturbance.  Respiratory:  Negative for shortness of breath.   Cardiovascular:  Negative for chest pain and syncope.  Gastrointestinal:  Negative for diarrhea, nausea and vomiting.  Genitourinary:  Negative for dysuria.  Neurological:  Positive for dizziness and numbness. Negative for headaches.    Physical Exam Updated Vital Signs BP (!) 86/63 (BP Location: Right Arm)   Pulse 73   Temp (!) 97.5 F (36.4 C) (Oral)   Resp 16   Ht '5\' 4"'$  (1.626 m)   Wt 91.6 kg   SpO2 100%   BMI 34.67 kg/m  Physical Exam Vitals and nursing note reviewed.  Constitutional:      General: She is not in acute distress.    Appearance: Normal appearance. She is well-developed.  HENT:     Head: Normocephalic and atraumatic.  Eyes:     Conjunctiva/sclera: Conjunctivae normal.  Cardiovascular:     Rate and Rhythm: Normal rate and regular rhythm.     Heart sounds: No murmur heard. Pulmonary:     Effort: Pulmonary effort is normal. No respiratory distress.     Breath sounds: Normal breath sounds.  Abdominal:     Palpations: Abdomen is soft.     Tenderness: There is no abdominal tenderness. There is no guarding or rebound.  Musculoskeletal:        General: No swelling.     Cervical back: Neck supple.  Skin:    General: Skin is warm and dry.     Capillary Refill: Capillary refill takes less than 2 seconds.  Neurological:      General: No focal deficit present.     Mental Status: She is alert.     Cranial Nerves: No cranial nerve deficit.     Sensory: No sensory deficit.     Motor: No weakness.     ED Results / Procedures / Treatments   Labs (all labs ordered are listed, but only abnormal results are displayed) Labs Reviewed  BASIC METABOLIC PANEL  CBC  URINALYSIS, ROUTINE W REFLEX MICROSCOPIC  CBG MONITORING, ED    EKG None  Radiology No results found.  Procedures Procedures  {Document cardiac monitor, telemetry assessment procedure when appropriate:1}  Medications Ordered in ED Medications  sodium chloride 0.9 % bolus 500 mL (has no administration in time range)  ED Course/ Medical Decision Making/ A&P   {   Click here for ABCD2, HEART and other calculatorsREFRESH Note before signing :1}                          Medical Decision Making Amount and/or Complexity of Data Reviewed Labs: ordered. Radiology: ordered.   This patient complains of ***; this involves an extensive number of treatment Options and is a complaint that carries with it a high risk of complications and morbidity. The differential includes ***  I ordered, reviewed and interpreted labs, which included *** I ordered medication *** and reviewed PMP when indicated. I ordered imaging studies which included *** and I independently    visualized and interpreted imaging which showed *** Additional history obtained from *** Previous records obtained and reviewed *** I consulted *** and discussed lab and imaging findings and discussed disposition.  Cardiac monitoring reviewed, *** Social determinants considered, *** Critical Interventions: ***  After the interventions stated above, I reevaluated the patient and found *** Admission and further testing considered, ***   {Document critical care time when appropriate:1} {Document review of labs and clinical decision tools ie heart score, Chads2Vasc2 etc:1}  {Document  your independent review of radiology images, and any outside records:1} {Document your discussion with family members, caretakers, and with consultants:1} {Document social determinants of health affecting pt's care:1} {Document your decision making why or why not admission, treatments were needed:1} Final Clinical Impression(s) / ED Diagnoses Final diagnoses:  None    Rx / DC Orders ED Discharge Orders     None

## 2022-06-06 NOTE — ED Notes (Addendum)
Pt not in room, pt in MRI. Delay in IV and labs.

## 2022-06-06 NOTE — Discharge Instructions (Signed)
You are seen in the emergency department for an episode of dizziness and feeling tired.  You had blood work EKG chest x-ray urinalysis and an MRI of your brain that did not show a definite explanation for your symptoms.  Please continue your regular medications and follow-up with your primary care doctor.  Return to the emergency department if any worsening or concerning symptoms.

## 2022-06-06 NOTE — ED Notes (Signed)
Back from MRI by w/c.

## 2022-06-07 ENCOUNTER — Telehealth: Payer: Self-pay

## 2022-06-07 LAB — CYTOLOGY - NON PAP

## 2022-06-07 NOTE — Transitions of Care (Post Inpatient/ED Visit) (Signed)
   06/07/2022  Name: Taylor Leach MRN: [id number] DOB: 1956-02-11  Today's TOC FU Call Status: Today's TOC FU Call Status:: Successful TOC FU Call Competed TOC FU Call Complete Date: 06/07/22  Transition Care Management Follow-up Telephone Call Date of Discharge: 06/06/22 Discharge [facility] Type of Discharge: Emergency Department Reason for ED Visit: Other: (dizziness) How have you been since you were released from the hospital?: Better Any questions or concerns?: No  Items Reviewed: Did you receive and understand the discharge instructions provided?: Yes Medications obtained and verified?: Yes (Medications Reviewed) Any new allergies since your discharge?: No Dietary orders reviewed?: Yes Do you have support at home?: Yes People in Home: spouse  Home Care and Equipment/Supplies: [place] Ordered?: NA Any new equipment or medical supplies ordered?: NA  Functional Questionnaire: Do you need assistance with bathing/showering or dressing?: No Do you need assistance with meal preparation?: No Do you need assistance with eating?: No Do you have difficulty maintaining continence: No Do you need assistance with getting out of bed/getting out of a chair/moving?: No Do you have difficulty managing or taking your medications?: No  Folllow up appointments reviewed: PCP Follow-up appointment confirmed?: No (no avail appt times, sent message to staff to schedule) MD Provider Line Number:[phone] Given: No [facility] Follow-up appointment confirmed?: NA Do you need transportation to your follow-up appointment?: No Do you understand care options if your condition(s) worsen?: Yes-patient verbalized understanding    [place], [place] Nurse Health Advisor Direct Dial [phone]

## 2022-06-14 ENCOUNTER — Ambulatory Visit (INDEPENDENT_AMBULATORY_CARE_PROVIDER_SITE_OTHER): Payer: Medicare Other | Admitting: Family Medicine

## 2022-06-14 ENCOUNTER — Encounter: Payer: Self-pay | Admitting: Family Medicine

## 2022-06-14 VITALS — BP 145/74 | HR 88 | Temp 98.7°F | Ht 64.0 in | Wt 201.2 lb

## 2022-06-14 DIAGNOSIS — Z7984 Long term (current) use of oral hypoglycemic drugs: Secondary | ICD-10-CM

## 2022-06-14 DIAGNOSIS — R42 Dizziness and giddiness: Secondary | ICD-10-CM

## 2022-06-14 DIAGNOSIS — E1169 Type 2 diabetes mellitus with other specified complication: Secondary | ICD-10-CM | POA: Diagnosis not present

## 2022-06-14 NOTE — Progress Notes (Signed)
Subjective: CC: Follow-up dizziness PCP: [name], DO [id number] Taylor Leach is a 67 y.o. female presenting to clinic today for:  1.  Dizziness Patient reports that she had an episode of dizziness which apparently ended up being a hypotensive episode with systolics in the [id number] about 2 weeks ago.  She was seen in the emergency department and they did a full workup.  No evidence of CVA or other abnormalities from a metabolic standpoint.  She has not had any recurrence.  There were no abnormalities leading up to the event including having skipped meals, dehydration etc.  She was totally well feeling prior to the event.  Additionally, she notes that she stopped the Ozempic shortly after restarting it due to diarrhea.  She was treated for some type of illness around that time and she is not sure if it was quite due to that but she is maintaining blood sugars without it and is really trying to keep a strict diet.  This seems to be easier now that her husband has alpha gal and they are having to avoid certain foods.   ROS: Per HPI  Allergies  Allergen Reactions   Fenofibrate Micronized Other (See Comments)    Caused increased LFT's Caused increased LFT's   Glipizide     Hypoglycemia    Codeine Nausea And Vomiting   Farxiga [Dapagliflozin]     Bladder pains   Hydrocodone Nausea And Vomiting and Other (See Comments)    Makes body hot.    Janumet [Sitagliptin-Metformin Hcl]     Extremely bad gas    Tape     Can cause skin to tear   Past Medical History:  Diagnosis Date   Arthritis    hands   Atypical chest pain 04/14/2022   See cardiology OV note from 04/14/22 in Epic. Atypical chest pain thought to be related to a hiatal hernia.   Autoimmune hepatitis ([place])    Follows w/ gastroenterology. LOV 12/14/21 with Charisse Klinefelter, AGNP (as of 04/18/22) in Epic.   Chest pain    Stress echo normal, October, 2012   COVID-19 03/2021   flu like symptoms, patient was prescribed an antiviral    Diabetes mellitus ([place]) 07/28/2016   Type 2, follows w/ PCP, Dr. Adam Phenix, [place] 01/28/22 (as of 0/15/24.) HgbA1c 6.8 on 01/28/22.   Dyslipidemia    Follows w/ PCP.   Ejection fraction    EF 55-60%, echo, October, 2012   GERD (gastroesophageal reflux disease)    Hiatal hernia    found during EGD   Idiopathic neuropathy    feet   NASH (nonalcoholic steatohepatitis)    chronic, follows w/ gastro, lov 12/14/21 in Epic   Wears glasses     Current Outpatient Medications:    acetaminophen (TYLENOL) 500 MG tablet, Take 1 tablet (500 mg total) by mouth every 6 (six) hours as needed (pain)., Disp: 30 tablet, Rfl: 0   albuterol (VENTOLIN HFA) 108 (90 Base) MCG/ACT inhaler, Inhale 1-2 puffs into the lungs every 6 (six) hours as needed for wheezing or shortness of breath., Disp: 6.7 g, Rfl: 0   atorvastatin (LIPITOR) 10 MG tablet, Take 1 tablet (10 mg total) by mouth daily., Disp: 90 tablet, Rfl: 3   blood glucose meter kit and supplies KIT, Dispense based on patient and insurance preference. Use up to four times daily as directed. (FOR ICD-9 250.00, 250.01). E11.9, Disp: 1 each, Rfl: 0   brompheniramine-pseudoephedrine-DM 30-2-10 MG/5ML syrup, Take 5 mLs by mouth 4 (  four) times daily as needed., Disp: 120 mL, Rfl: 0   budesonide (ENTOCORT EC) 3 MG 24 hr capsule, Take 3 mg by mouth daily., Disp: , Rfl:    cetirizine (ZYRTEC) 10 MG tablet, Take by mouth as needed., Disp: , Rfl:    CONTOUR NEXT TEST test strip, CHECK BLOOD SUGAR UP TO 4 TIMES A DAY OR AS DIRECTED, Disp: 400 each, Rfl: 2   fluorouracil (EFUDEX) 5 % cream, Apply topically 2 (two) times daily. X2-4 weeks, Disp: 40 g, Rfl: 1   fluticasone (FLONASE) 50 MCG/ACT nasal spray, Place 1 spray into both nostrils 2 (two) times daily as needed for allergies or rhinitis., Disp: 48 g, Rfl: 1   ibuprofen (ADVIL) 600 MG tablet, Take 1 tablet (600 mg total) by mouth every 6 (six) hours as needed., Disp: 30 tablet, Rfl: 0   mycophenolate  (MYFORTIC) 360 MG TBEC EC tablet, Take 360 mg by mouth 2 (two) times daily., Disp: , Rfl:    pantoprazole (PROTONIX) 40 MG tablet, Take 1 tablet (40 mg total) by mouth daily., Disp: 90 tablet, Rfl: 3   polyethylene glycol powder (GLYCOLAX/MIRALAX) 17 GM/SCOOP powder, Take 17 g by mouth daily. Drink 17g (1 scoop) dissolved in water per day., Disp: 255 g, Rfl: 0   Semaglutide,0.25 or 0.'5MG'$ /DOS, (OZEMPIC, 0.25 OR 0.5 MG/DOSE,) 2 MG/3ML SOPN, Inject 0.5 mg into the skin every 7 (seven) days. (Patient taking differently: Inject 0.5 mg into the skin every 7 (seven) days. As of 04/19/22, patient last took [place] on 04/10/22. She will resume after surgery.), Disp: 9 mL, Rfl: 3   triamcinolone cream (KENALOG) 0.1 %, Apply 1 Application topically 2 (two) times daily as needed (eczema flare for up to 7 days). To affected area on ear, Disp: 30 g, Rfl: 1 Social History   Socioeconomic History   Marital status: Married    Spouse name: Not on file   Number of children: Not on file   Years of education: Not on file   Highest education level: Not on file  Occupational History   Not on file  Tobacco Use   Smoking status: Former    Packs/day: 1.00    Years: 10.00    Total pack years: 10.00    Types: Cigarettes    Quit date: 82    Years since quitting: 36.2   Smokeless tobacco: Never  Vaping Use   Vaping Use: Never used  Substance and Sexual Activity   Alcohol use: Not Currently   Drug use: No   Sexual activity: Yes    Birth control/protection: Surgical  Other Topics Concern   Not on file  Social History Narrative   Patient resides with her spouse.  She has a son and a daughter, both of whom live within 1 hour of her home.  She also has pets.  No fall risks however.  They are well-behaved.  She retired in June 2023.   Social Determinants of Health   Financial Resource Strain: Not on file  Food Insecurity: No Food Insecurity (05/03/2022)   Hunger Vital Sign    Worried About Running Out of Food in  the Last Year: Never true    Ran Out of Food in the Last Year: Never true  Transportation Needs: No Transportation Needs (05/03/2022)   PRAPARE - Hydrologist (Medical): No    Lack of Transportation (Non-Medical): No  Physical Activity: Inactive (05/03/2022)   Exercise Vital Sign    Days of Exercise per  Week: 0 days    Minutes of Exercise per Session: 0 min  Stress: Not on file  Social Connections: Not on file  Intimate Partner Violence: Not on file   Family History  Problem Relation Age of Onset   Cancer Mother    Cancer Father    Lupus Daughter    Arthritis/Rheumatoid Daughter    Diabetes type II Daughter     Objective: Office vital signs reviewed. BP (!) 145/74   Pulse 88   Temp 98.7 F (37.1 C)   Ht '5\' 4"'$  (1.626 m)   Wt 201 lb 3.2 oz (91.3 kg)   SpO2 97%   BMI 34.54 kg/m   Physical Examination:  General: Awake, alert, nontoxic obese female, No acute distress HEENT:sclera white, MMM Cardio: regular rate and rhythm, S1S2 heard, no murmurs appreciated Pulm: clear to auscultation bilaterally, no wheezes, rhonchi or rales; normal work of breathing on room air    Assessment/ Plan: 67 y.o. female   Dizziness  Type 2 diabetes mellitus with other specified complication, without long-term current use of insulin (HCC)  Resolved.  I am not quite sure why she had this transient drop in blood pressure.  Quite unusual.  We discussed potential autonomic dysfunction.  If it recurs, low threshold to have her further evaluated by cardiology and/or neurology.  She is off the GLP secondary to GI issues.  She is willing to potentially start an alternative if blood sugars are above goal at our visit in May.  Would plan for Onglyza versus Trulicity  No orders of the defined types were placed in this encounter.  No orders of the defined types were placed in this encounter.    [name], DO [place] [phone]

## 2022-08-05 ENCOUNTER — Ambulatory Visit (INDEPENDENT_AMBULATORY_CARE_PROVIDER_SITE_OTHER): Payer: Medicare Other | Admitting: Family Medicine

## 2022-08-05 ENCOUNTER — Encounter: Payer: Self-pay | Admitting: Family Medicine

## 2022-08-05 ENCOUNTER — Other Ambulatory Visit (INDEPENDENT_AMBULATORY_CARE_PROVIDER_SITE_OTHER): Payer: Medicare Other

## 2022-08-05 VITALS — BP 129/67 | HR 59 | Temp 98.5°F | Ht 64.0 in | Wt 196.0 lb

## 2022-08-05 DIAGNOSIS — K754 Autoimmune hepatitis: Secondary | ICD-10-CM | POA: Diagnosis not present

## 2022-08-05 DIAGNOSIS — R011 Cardiac murmur, unspecified: Secondary | ICD-10-CM | POA: Diagnosis not present

## 2022-08-05 DIAGNOSIS — Z7985 Long-term (current) use of injectable non-insulin antidiabetic drugs: Secondary | ICD-10-CM

## 2022-08-05 DIAGNOSIS — R001 Bradycardia, unspecified: Secondary | ICD-10-CM | POA: Diagnosis not present

## 2022-08-05 DIAGNOSIS — E1169 Type 2 diabetes mellitus with other specified complication: Secondary | ICD-10-CM

## 2022-08-05 DIAGNOSIS — E041 Nontoxic single thyroid nodule: Secondary | ICD-10-CM

## 2022-08-05 DIAGNOSIS — I152 Hypertension secondary to endocrine disorders: Secondary | ICD-10-CM

## 2022-08-05 DIAGNOSIS — E1159 Type 2 diabetes mellitus with other circulatory complications: Secondary | ICD-10-CM

## 2022-08-05 DIAGNOSIS — L309 Dermatitis, unspecified: Secondary | ICD-10-CM

## 2022-08-05 DIAGNOSIS — E785 Hyperlipidemia, unspecified: Secondary | ICD-10-CM

## 2022-08-05 LAB — BAYER DCA HB A1C WAIVED: HB A1C (BAYER DCA - WAIVED): 6.7 % — ABNORMAL HIGH (ref 4.8–5.6)

## 2022-08-05 MED ORDER — TRIAMCINOLONE ACETONIDE 0.1 % EX CREA: 1.0000 | TOPICAL_CREAM | Freq: Two times a day (BID) | CUTANEOUS | 1 refills | Status: DC | PRN

## 2022-08-05 NOTE — Progress Notes (Unsigned)
Subjective: CC:DM PCP: Raliegh Ip, DO [id number] TENAJA OHLE is a 67 y.o. female presenting to clinic today for:  1. Type 2 Diabetes with hypertension, hyperlipidemia:  Glucometer:***.   High at home: ***; Low at home: ***, Taking medication(s): ***,.  Last eye exam: *** Last foot exam: *** Last A1c:  Lab Results  Component Value Date   HGBA1C 6.9 (H) 05/03/2022   Nephropathy screen indicated?: *** Last flu, zoster and/or pneumovax:  Immunization History  Administered Date(s) Administered   Hep A / Hep B 03/24/2008, 04/23/2008, 09/08/2008   Influenza, Seasonal, Injecte, Preservative Fre 01/10/2008   Influenza,inj,Quad PF,6+ Mos 05/18/2016   Influenza-Unspecified 02/02/2018   PFIZER(Purple Top)SARS-COV-2 Vaccination 11/08/2019, 11/29/2019   PNEUMOCOCCAL CONJUGATE-20 10/29/2021   Pneumococcal Polysaccharide-23 11/30/2017   Zoster Recombinat (Shingrix) 12/12/2018, 06/12/2019    ROS: ***dizziness, LOC, polyuria, polydipsia, unintended weight loss/gain, foot ulcerations, numbness or tingling in extremities, shortness of breath or chest pain.  2. Autoimmune hepatitis ***   ROS: Per HPI  Allergies  Allergen Reactions   Fenofibrate Micronized Other (See Comments)    Caused increased LFT's Caused increased LFT's   Glipizide     Hypoglycemia    Codeine Nausea And Vomiting   Farxiga [Dapagliflozin]     Bladder pains   Hydrocodone Nausea And Vomiting and Other (See Comments)    Makes body hot.    Janumet [Sitagliptin-Metformin Hcl]     Extremely bad gas    Tape     Can cause skin to tear   Past Medical History:  Diagnosis Date   Arthritis    hands   Atypical chest pain 04/14/2022   See cardiology OV note from 04/14/22 in Epic. Atypical chest pain thought to be related to a hiatal hernia.   Autoimmune hepatitis (HCC)    Follows w/ gastroenterology. LOV 12/14/21 with Annie Sable, AGNP (as of 04/18/22) in Epic.   Chest pain    Stress echo normal, October,  2012   COVID-19 03/2021   flu like symptoms, patient was prescribed an antiviral   Diabetes mellitus (HCC) 07/28/2016   Type 2, follows w/ PCP, Dr. Doylene Canard, LOV 01/28/22 (as of 0/15/24.) HgbA1c 6.8 on 01/28/22.   Dyslipidemia    Follows w/ PCP.   Ejection fraction    EF 55-60%, echo, October, 2012   GERD (gastroesophageal reflux disease)    Hiatal hernia    found during EGD   Idiopathic neuropathy    feet   NASH (nonalcoholic steatohepatitis)    chronic, follows w/ gastro, lov 12/14/21 in Epic   Wears glasses     Current Outpatient Medications:    acetaminophen (TYLENOL) 500 MG tablet, Take 1 tablet (500 mg total) by mouth every 6 (six) hours as needed (pain)., Disp: 30 tablet, Rfl: 0   albuterol (VENTOLIN HFA) 108 (90 Base) MCG/ACT inhaler, Inhale 1-2 puffs into the lungs every 6 (six) hours as needed for wheezing or shortness of breath., Disp: 6.7 g, Rfl: 0   atorvastatin (LIPITOR) 10 MG tablet, Take 1 tablet (10 mg total) by mouth daily., Disp: 90 tablet, Rfl: 3   blood glucose meter kit and supplies KIT, Dispense based on patient and insurance preference. Use up to four times daily as directed. (FOR ICD-9 250.00, 250.01). E11.9, Disp: 1 each, Rfl: 0   brompheniramine-pseudoephedrine-DM 30-2-10 MG/5ML syrup, Take 5 mLs by mouth 4 (four) times daily as needed., Disp: 120 mL, Rfl: 0   budesonide (ENTOCORT EC) 3 MG 24 hr capsule, Take 3 mg  by mouth daily., Disp: , Rfl:    cetirizine (ZYRTEC) 10 MG tablet, Take by mouth as needed., Disp: , Rfl:    CONTOUR NEXT TEST test strip, CHECK BLOOD SUGAR UP TO 4 TIMES A DAY OR AS DIRECTED, Disp: 400 each, Rfl: 2   fluorouracil (EFUDEX) 5 % cream, Apply topically 2 (two) times daily. X2-4 weeks, Disp: 40 g, Rfl: 1   fluticasone (FLONASE) 50 MCG/ACT nasal spray, Place 1 spray into both nostrils 2 (two) times daily as needed for allergies or rhinitis., Disp: 48 g, Rfl: 1   ibuprofen (ADVIL) 600 MG tablet, Take 1 tablet (600 mg total) by  mouth every 6 (six) hours as needed., Disp: 30 tablet, Rfl: 0   mycophenolate (MYFORTIC) 360 MG TBEC EC tablet, Take 360 mg by mouth 2 (two) times daily., Disp: , Rfl:    pantoprazole (PROTONIX) 40 MG tablet, Take 1 tablet (40 mg total) by mouth daily., Disp: 90 tablet, Rfl: 3   polyethylene glycol powder (GLYCOLAX/MIRALAX) 17 GM/SCOOP powder, Take 17 g by mouth daily. Drink 17g (1 scoop) dissolved in water per day., Disp: 255 g, Rfl: 0   Semaglutide,0.25 or 0.5MG /DOS, (OZEMPIC, 0.25 OR 0.5 MG/DOSE,) 2 MG/3ML SOPN, Inject 0.5 mg into the skin every 7 (seven) days. (Patient taking differently: Inject 0.5 mg into the skin every 7 (seven) days. As of 04/19/22, patient last took Ozempic on 04/10/22. She will resume after surgery.), Disp: 9 mL, Rfl: 3   triamcinolone cream (KENALOG) 0.1 %, Apply 1 Application topically 2 (two) times daily as needed (eczema flare for up to 7 days). To affected area on ear, Disp: 30 g, Rfl: 1 Social History   Socioeconomic History   Marital status: Married    Spouse name: Not on file   Number of children: Not on file   Years of education: Not on file   Highest education level: Not on file  Occupational History   Not on file  Tobacco Use   Smoking status: Former    Packs/day: 1.00    Years: 10.00    Additional pack years: 0.00    Total pack years: 10.00    Types: Cigarettes    Quit date: 51    Years since quitting: 36.3   Smokeless tobacco: Never  Vaping Use   Vaping Use: Never used  Substance and Sexual Activity   Alcohol use: Not Currently   Drug use: No   Sexual activity: Yes    Birth control/protection: Surgical  Other Topics Concern   Not on file  Social History Narrative   Patient resides with her spouse.  She has a son and a daughter, both of whom live within 1 hour of her home.  She also has pets.  No fall risks however.  They are well-behaved.  She retired in June 2023.   Social Determinants of Health   Financial Resource Strain: Not on file   Food Insecurity: No Food Insecurity (05/03/2022)   Hunger Vital Sign    Worried About Running Out of Food in the Last Year: Never true    Ran Out of Food in the Last Year: Never true  Transportation Needs: No Transportation Needs (05/03/2022)   PRAPARE - Administrator, Civil Service (Medical): No    Lack of Transportation (Non-Medical): No  Physical Activity: Inactive (05/03/2022)   Exercise Vital Sign    Days of Exercise per Week: 0 days    Minutes of Exercise per Session: 0 min  Stress: Not  on file  Social Connections: Not on file  Intimate Partner Violence: Not on file   Family History  Problem Relation Age of Onset   Cancer Mother    Cancer Father    Lupus Daughter    Arthritis/Rheumatoid Daughter    Diabetes type II Daughter     Objective: Office vital signs reviewed. There were no vitals taken for this visit.  Physical Examination:  General: Awake, alert, *** nourished, No acute distress HEENT: Normal    Neck: No masses palpated. No lymphadenopathy    Ears: Tympanic membranes intact, normal light reflex, no erythema, no bulging    Eyes: PERRLA, extraocular membranes intact, sclera ***    Nose: nasal turbinates moist, *** nasal discharge    Throat: moist mucus membranes, no erythema, *** tonsillar exudate.  Airway is patent Cardio: regular rate and rhythm, S1S2 heard, no murmurs appreciated Pulm: clear to auscultation bilaterally, no wheezes, rhonchi or rales; normal work of breathing on room air GI: soft, non-tender, non-distended, bowel sounds present x4, no hepatomegaly, no splenomegaly, no masses GU: external vaginal tissue ***, cervix ***, *** punctate lesions on cervix appreciated, *** discharge from cervical os, *** bleeding, *** cervical motion tenderness, *** abdominal/ adnexal masses Extremities: warm, well perfused, No edema, cyanosis or clubbing; +*** pulses bilaterally MSK: *** gait and *** station Skin: dry; intact; no rashes or lesions Neuro:  *** Strength and light touch sensation grossly intact, *** DTRs ***/4  Assessment/ Plan: 67 y.o. female   ***  No orders of the defined types were placed in this encounter.  No orders of the defined types were placed in this encounter.    Raliegh Ip, DO [facility] [phone]

## 2022-08-05 NOTE — Patient Instructions (Signed)
Bradycardia, Adult Bradycardia is a slower-than-normal heartbeat. A normal resting heart rate for an adult ranges from 60 to 100 beats per minute. With bradycardia, the resting heart rate is less than 60 beats per minute. Bradycardia can prevent enough oxygen from reaching certain areas of your body when you are active. It can be serious if it keeps enough oxygen from reaching your brain and other parts of your body. Bradycardia is not a problem for everyone. For some healthy adults, a slow resting heart rate is normal. What are the causes? This condition may be caused by: A problem with the heart, including: A problem with the heart's electrical system, such as a heart block. With a heart block, electrical signals between the chambers of the heart are partially or completely blocked, so they are not able to work as they should. A problem with the heart's natural pacemaker (sinus node). Heart disease. A heart attack. Heart damage. Lyme disease. A heart infection. A heart condition that is present at birth (congenital heart defect). Certain medicines that treat heart conditions. Certain conditions, such as hypothyroidism and obstructive sleep apnea. Problems with the balance of chemicals and other substances, like potassium, in the blood. Trauma. Radiation therapy. What increases the risk? You are more likely to develop this condition if you: Are age 65 or older. Have high blood pressure (hypertension), high cholesterol (hyperlipidemia), or diabetes. Drink heavily, use tobacco or nicotine products, or use drugs. What are the signs or symptoms? Symptoms of this condition include: Light-headedness. Feeling faint or fainting. Fatigue and weakness. Trouble with activity or exercise. Shortness of breath. Chest pain (angina). Drowsiness. Confusion. Dizziness. How is this diagnosed? This condition may be diagnosed based on: Your symptoms. Your medical history. A physical exam. During  the exam, your health care provider will listen to your heartbeat and check your pulse. To confirm the diagnosis, your health care provider may order tests, such as: Blood tests. An electrocardiogram (ECG). This test records the heart's electrical activity. The test can show how fast your heart is beating and whether the heartbeat is steady. A test in which you wear a portable device (event recorder or Holter monitor) to record your heart's electrical activity while you go about your day. An exercise test. How is this treated? Treatment for this condition depends on the cause of the condition and how severe your symptoms are. Treatment may involve: Treatment of the underlying condition. Changing your medicines or how much medicine you take. Having a small, battery-operated device called a pacemaker implanted under the skin. When bradycardia occurs, this device can be used to increase your heart rate and help your heart beat in a regular rhythm. Follow these instructions at home: Lifestyle Manage any health conditions that contribute to bradycardia as told by your health care provider. Follow a heart-healthy diet. A nutrition specialist (dietitian) can help educate you about healthy food options and changes. Follow an exercise program that is approved by your health care provider. Maintain a healthy weight. Try to reduce or manage your stress, such as with yoga or meditation. If you need help reducing stress, ask your health care provider. Do not use any products that contain nicotine or tobacco. These products include cigarettes, chewing tobacco, and vaping devices, such as e-cigarettes. If you need help quitting, ask your health care provider. Do not use illegal drugs. Alcohol use If you drink alcohol: Limit how much you have to: 0-1 drink a day for women who are not pregnant. 0-2 drinks a day   for men. Know how much alcohol is in a drink. In the [place] one drink equals one 12 oz bottle of  beer (355 mL), one 5 oz glass of wine (148 mL), or one 1 oz glass of hard liquor (44 mL). General instructions Take over-the-counter and prescription medicines only as told by your health care provider. Keep all follow-up visits. This is important. How is this prevented? In some cases, bradycardia may be prevented by: Treating underlying medical problems. Stopping behaviors or medicines that can trigger the condition. Contact a health care provider if: You feel light-headed or dizzy. You almost faint. You feel weak or are easily fatigued during physical activity. You experience confusion or have memory problems. Get help right away if: You faint. You have chest pains or an irregular heartbeat (palpitations). You have trouble breathing. These symptoms may represent a serious problem that is an emergency. Do not wait to see if the symptoms will go away. Get medical help right away. Call your local emergency services (911 in the [place] Do not drive yourself to the hospital. Summary Bradycardia is a slower-than-normal heartbeat. With bradycardia, the resting heart rate is less than 60 beats per minute. Treatment for this condition depends on the cause. Manage any health conditions that contribute to bradycardia as told by your health care provider. Do not use any products that contain nicotine or tobacco. These products include cigarettes, chewing tobacco, and vaping devices, such as e-cigarettes. Keep all follow-up visits. This is important. This information is not intended to replace advice given to you by your health care provider. Make sure you discuss any questions you have with your health care provider. Document Revised: 07/12/2020 Document Reviewed: 07/12/2020 Elsevier Patient Education  2023 [employer]  

## 2022-08-06 LAB — LIPID PANEL
Chol/HDL Ratio: 2.8 ratio (ref 0.0–4.4)
Cholesterol, Total: 148 mg/dL (ref 100–199)
HDL: 52 mg/dL (ref >39)
LDL Chol Calc (NIH): 74 mg/dL (ref 0–99)
Triglycerides: 126 mg/dL (ref 0–149)
VLDL Cholesterol Cal: 22 mg/dL (ref 5–40)

## 2022-08-06 LAB — CBC WITH DIFFERENTIAL/PLATELET
Basophils Absolute: 0 10*3/uL (ref 0.0–0.2)
Basos: 1 %
EOS (ABSOLUTE): 0.1 10*3/uL (ref 0.0–0.4)
Eos: 2 %
Hematocrit: 41 % (ref 34.0–46.6)
Hemoglobin: 14.2 g/dL (ref 11.1–15.9)
Immature Grans (Abs): 0 10*3/uL (ref 0.0–0.1)
Immature Granulocytes: 0 %
Lymphocytes Absolute: 1 10*3/uL (ref 0.7–3.1)
Lymphs: 23 %
MCH: 32.4 pg (ref 26.6–33.0)
MCHC: 34.6 g/dL (ref 31.5–35.7)
MCV: 94 fL (ref 79–97)
Monocytes Absolute: 0.7 10*3/uL (ref 0.1–0.9)
Monocytes: 16 %
Neutrophils Absolute: 2.5 10*3/uL (ref 1.4–7.0)
Neutrophils: 58 %
Platelets: 153 10*3/uL (ref 150–450)
RBC: 4.38 x10E6/uL (ref 3.77–5.28)
RDW: 12 % (ref 11.7–15.4)
WBC: 4.2 10*3/uL (ref 3.4–10.8)

## 2022-08-06 LAB — THYROID PANEL WITH TSH
Free Thyroxine Index: 2.3 (ref 1.2–4.9)
T3 Uptake Ratio: 29 % (ref 24–39)
T4, Total: 7.8 ug/dL (ref 4.5–12.0)
TSH: 2.44 u[IU]/mL (ref 0.450–4.500)

## 2022-08-06 LAB — T4, FREE: Free T4: 1.33 ng/dL (ref 0.82–1.77)

## 2022-08-06 LAB — HEPATIC FUNCTION PANEL
ALT: 38 IU/L — ABNORMAL HIGH (ref 0–32)
AST: 29 IU/L (ref 0–40)
Albumin: 4 g/dL (ref 3.9–4.9)
Alkaline Phosphatase: 72 IU/L (ref 44–121)
Bilirubin Total: 0.8 mg/dL (ref 0.0–1.2)
Bilirubin, Direct: 0.21 mg/dL (ref 0.00–0.40)
Total Protein: 7 g/dL (ref 6.0–8.5)

## 2022-08-06 LAB — IGG: IgG (Immunoglobin G), Serum: 1681 mg/dL — ABNORMAL HIGH (ref 586–1602)

## 2022-08-23 ENCOUNTER — Ambulatory Visit: Payer: Medicare Other

## 2022-09-02 ENCOUNTER — Encounter: Payer: Self-pay | Admitting: Family Medicine

## 2022-10-17 ENCOUNTER — Ambulatory Visit: Payer: Medicare Other | Attending: Cardiology | Admitting: Cardiology

## 2022-10-17 ENCOUNTER — Encounter: Payer: Self-pay | Admitting: Cardiology

## 2022-10-17 VITALS — BP 118/82 | HR 65 | Ht 65.0 in | Wt 200.4 lb

## 2022-10-17 DIAGNOSIS — R42 Dizziness and giddiness: Secondary | ICD-10-CM | POA: Insufficient documentation

## 2022-10-17 DIAGNOSIS — R011 Cardiac murmur, unspecified: Secondary | ICD-10-CM | POA: Diagnosis present

## 2022-10-17 DIAGNOSIS — R0789 Other chest pain: Secondary | ICD-10-CM | POA: Diagnosis present

## 2022-10-17 DIAGNOSIS — I959 Hypotension, unspecified: Secondary | ICD-10-CM | POA: Insufficient documentation

## 2022-10-17 DIAGNOSIS — R002 Palpitations: Secondary | ICD-10-CM | POA: Insufficient documentation

## 2022-10-17 LAB — HM DIABETES EYE EXAM

## 2022-10-17 NOTE — Progress Notes (Signed)
Clinical Summary Taylor Leach is a 67 y.o.female seen today for follow up of the following medical problems.   1. Palpitations - 08/2019 monitor showed SR with PVCs 08/2022 monitor: 14 day monitor, min HR 46, Max HR 167, Avg 70. 13 runs SVT longest 16 sec, otherwise rare ectopy.  - infrequent, about 1-2 times per week. Can last up to 15-20 minutes - 1 cup of coffee in AM, rare sodas, rare tea, no energy drinks, no EtOH   2. Orthostatic dizziness - occasional orthostatic dizziness, though fairly infrequent - reports adequate hydration - orthostatics neg in clinic today  -episode in March low bp's, 86/63 in triage - felt dizzy, tingling all over. Was at home, symptosm started around 10AM. No diarrhea, no vomiting, normal oral intake.  - was standing, started feeling dizzy. Went and sat down, symptoms did not resolve. Felt tingling in arms, no nausea.  - drinks 4 bottles of water a day.  - no recurrent dizziness since - she is diet contorlled DM2   3. Chest pain - episode in June - was at families house, sitting and talking at the table - lasted about 1 minute. Sharp pain/fullness, 8/10 in severity. No other associated symptoms. Not positional. No prior symptoms, none since. - walks 2 miles in the morning at times, last was a week ago. No specific exertional symptoms.        3. LE edema - mild recent LE edema. No significant SOB or DOE     4. Autoimmune hepatitis - followed by rheum     5.Heart murmur -noted by pcp 08/2022 visit    Past Medical History:  Diagnosis Date   Arthritis    hands   Atypical chest pain 04/14/2022   See cardiology OV note from 04/14/22 in Epic. Atypical chest pain thought to be related to a hiatal hernia.   Autoimmune hepatitis (HCC)    Follows w/ gastroenterology. LOV 12/14/21 with Annie Sable, AGNP (as of 04/18/22) in Epic.   Chest pain    Stress echo normal, October, 2012   COVID-19 03/2021   flu like symptoms, patient was  prescribed an antiviral   Diabetes mellitus (HCC) 07/28/2016   Type 2, follows w/ PCP, Dr. Doylene Canard, LOV 01/28/22 (as of 0/15/24.) HgbA1c 6.8 on 01/28/22.   Dyslipidemia    Follows w/ PCP.   Ejection fraction    EF 55-60%, echo, October, 2012   GERD (gastroesophageal reflux disease)    Hiatal hernia    found during EGD   Idiopathic neuropathy    feet   NASH (nonalcoholic steatohepatitis)    chronic, follows w/ gastro, lov 12/14/21 in Epic   Wears glasses      Allergies  Allergen Reactions   Fenofibrate Micronized Other (See Comments)    Caused increased LFT's Caused increased LFT's   Glipizide     Hypoglycemia    Codeine Nausea And Vomiting   Farxiga [Dapagliflozin]     Bladder pains   Hydrocodone Nausea And Vomiting and Other (See Comments)    Makes body hot.    Janumet [Sitagliptin-Metformin Hcl]     Extremely bad gas    Tape     Can cause skin to tear     Current Outpatient Medications  Medication Sig Dispense Refill   atorvastatin (LIPITOR) 10 MG tablet Take 1 tablet (10 mg total) by mouth daily. 90 tablet 3   blood glucose meter kit and supplies KIT Dispense based on patient and insurance  preference. Use up to four times daily as directed. (FOR ICD-9 250.00, 250.01). E11.9 1 each 0   budesonide (ENTOCORT EC) 3 MG 24 hr capsule Take 3 mg by mouth daily.     cetirizine (ZYRTEC) 10 MG tablet Take by mouth as needed.     CONTOUR NEXT TEST test strip CHECK BLOOD SUGAR UP TO 4 TIMES A DAY OR AS DIRECTED 400 each 2   fluorouracil (EFUDEX) 5 % cream Apply topically 2 (two) times daily. X2-4 weeks 40 g 1   fluticasone (FLONASE) 50 MCG/ACT nasal spray Place 1 spray into both nostrils 2 (two) times daily as needed for allergies or rhinitis. 48 g 1   mycophenolate (MYFORTIC) 360 MG TBEC EC tablet Take 360 mg by mouth 2 (two) times daily.     pantoprazole (PROTONIX) 40 MG tablet Take 1 tablet (40 mg total) by mouth daily. 90 tablet 3   triamcinolone cream (KENALOG)  0.1 % Apply 1 Application topically 2 (two) times daily as needed (eczema flare for up to 7 days). 80 g 1   No current facility-administered medications for this visit.     Past Surgical History:  Procedure Laterality Date   CHOLECYSTECTOMY  2008   COLONOSCOPY  06/10/2019   multiple colon polyps   CYSTOSCOPY N/A 04/20/2022   Procedure: CYSTOSCOPY;  Surgeon: Marguerita Beards, MD;  Location: [facility];  Service: Gynecology;  Laterality: N/A;   ESOPHAGOGASTRODUODENOSCOPY     around 2018   EYE SURGERY Bilateral 07/15/2021   eye lids   LIVER BIOPSY  2005   LIVER BIOPSY  2019   steatohepatitis & moderate lymphoplasmacytic infiltrate & stage 2 -3 fibrosis   NASAL SINUS SURGERY     around 2001   RECTOCELE [facility]PA[facility]  2014   [facility]   ROBOTIC ASSISTED LAPAROSCOPIC SACROCOLPOPEXY N/A 04/20/2022   Procedure: XI ROBOTIC ASSISTED LAPAROSCOPIC SACROCOLPOPEXY;  Surgeon: Marguerita Beards, MD;  Location: [facility];  Service: Gynecology;  Laterality: N/A;   VAGINAL HYSTERECTOMY  1999     Allergies  Allergen Reactions   Fenofibrate Micronized Other (See Comments)    Caused increased LFT's Caused increased LFT's   Glipizide     Hypoglycemia    Codeine Nausea And Vomiting   Farxiga [Dapagliflozin]     Bladder pains   Hydrocodone Nausea And Vomiting and Other (See Comments)    Makes body hot.    Janumet [Sitagliptin-Metformin Hcl]     Extremely bad gas    Tape     Can cause skin to tear      Family History  Problem Relation Age of Onset   Cancer Mother    Cancer Father    Lupus Daughter    Arthritis/Rheumatoid Daughter    Diabetes type II Daughter      Social History Taylor Leach reports that she quit smoking about 36 years ago. Her smoking use included cigarettes. She started smoking about 46 years ago. She has a 10 pack-year smoking history. She has never used smokeless tobacco. Taylor Leach reports that she does  not currently use alcohol.   Review of Systems CONSTITUTIONAL: No weight loss, fever, chills, weakness or fatigue.  HEENT: Eyes: No visual loss, blurred vision, double vision or yellow sclerae.No hearing loss, sneezing, congestion, runny nose or sore throat.  SKIN: No rash or itching.  CARDIOVASCULAR: per hpi RESPIRATORY: No shortness of breath, cough or sputum.  GASTROINTESTINAL: No anorexia, nausea, vomiting or diarrhea. No  abdominal pain or blood.  GENITOURINARY: No burning on urination, no polyuria NEUROLOGICAL: per hpi  MUSCULOSKELETAL: No muscle, back pain, joint pain or stiffness.  LYMPHATICS: No enlarged nodes. No history of splenectomy.  PSYCHIATRIC: No history of depression or anxiety.  ENDOCRINOLOGIC: No reports of sweating, cold or heat intolerance. No polyuria or polydipsia.  Marland Kitchen   Physical Examination Today's Vitals   10/17/22 0924  BP: 118/82  Pulse: 65  SpO2: 98%  Weight: 200 lb 6.4 oz (90.9 kg)  Height: 5\' 5"  (1.651 m)   Body mass index is 33.35 kg/m.  Gen: resting comfortably, no acute distress HEENT: no scleral icterus, pupils equal round and reactive, no palptable cervical adenopathy,  CV: RRR, 2/6 systolic murmur rusb, no jvd Resp: Clear to auscultation bilaterally GI: abdomen is soft, non-tender, non-distended, normal bowel sounds, no hepatosplenomegaly MSK: extremities are warm, no edema.  Skin: warm, no rash Neuro:  no focal deficits Psych: appropriate affect   Diagnostic Studies     Assessment and Plan   1. Palpitations -rare ectopy on recent monitor, rare short episodes SVT - limited symptoms, monitor at this time. Could try low dose beta blocker if needed, in general she is not in favor of starting at this time but would consider if symptoms progress  2. Dizziness - isolated episode of low bp and dizziness unlcear etiology - checked orthostatics today and they are normal, check AM cortisol  3. Chest pain - isolated episode, monitor at  this time. F/u echo for heart murmur  4. Heart murmur - obtain echo  F/u 6 months  Antoine Poche, M.D.

## 2022-10-17 NOTE — Patient Instructions (Addendum)
Medication Instructions:   Continue all current medications.   Labwork:  AM Cortisol level   Testing/Procedures:  Your physician has requested that you have an echocardiogram. Echocardiography is a painless test that uses sound waves to create images of your heart. It provides your doctor with information about the size and shape of your heart and how well your heart's chambers and valves are working. This procedure takes approximately one hour. There are no restrictions for this procedure. Please do NOT wear cologne, perfume, aftershave, or lotions (deodorant is allowed). Please arrive 15 minutes prior to your appointment time.  Follow-Up:  Office will contact with results via phone, letter or mychart.    3 months   Any Other Special Instructions Will Be Listed Below (If Applicable).   If you need a refill on your cardiac medications before your next appointment, please call your pharmacy.

## 2022-10-21 ENCOUNTER — Other Ambulatory Visit: Payer: Self-pay | Admitting: Cardiology

## 2022-10-21 ENCOUNTER — Other Ambulatory Visit: Payer: Medicare Other

## 2022-10-22 LAB — CORTISOL: Cortisol: 1.9 ug/dL — ABNORMAL LOW (ref 6.2–19.4)

## 2022-10-26 ENCOUNTER — Ambulatory Visit: Payer: Medicare Other | Attending: Cardiology

## 2022-10-26 DIAGNOSIS — R011 Cardiac murmur, unspecified: Secondary | ICD-10-CM | POA: Diagnosis not present

## 2022-10-26 LAB — ECHOCARDIOGRAM COMPLETE
AR max vel: 1.89 cm2
AV Area VTI: 2.02 cm2
AV Area mean vel: 2.02 cm2
AV Mean grad: 3 mmHg
AV Peak grad: 6.3 mmHg
Ao pk vel: 1.25 m/s
Area-P 1/2: 1.98 cm2
Calc EF: 65.2 %
MV VTI: 1.72 cm2
S' Lateral: 2.9 cm
Single Plane A2C EF: 71.5 %
Single Plane A4C EF: 57 %

## 2022-10-28 ENCOUNTER — Encounter: Payer: Self-pay | Admitting: *Deleted

## 2022-10-28 ENCOUNTER — Other Ambulatory Visit: Payer: Self-pay | Admitting: *Deleted

## 2022-10-28 DIAGNOSIS — R7989 Other specified abnormal findings of blood chemistry: Secondary | ICD-10-CM

## 2022-11-09 ENCOUNTER — Encounter: Payer: Self-pay | Admitting: Family Medicine

## 2022-11-09 ENCOUNTER — Ambulatory Visit (INDEPENDENT_AMBULATORY_CARE_PROVIDER_SITE_OTHER): Payer: Medicare Other | Admitting: Family Medicine

## 2022-11-09 ENCOUNTER — Telehealth: Payer: Self-pay | Admitting: Family Medicine

## 2022-11-09 VITALS — BP 134/77 | HR 67 | Temp 98.7°F | Ht 65.0 in | Wt 199.0 lb

## 2022-11-09 DIAGNOSIS — E1169 Type 2 diabetes mellitus with other specified complication: Secondary | ICD-10-CM | POA: Diagnosis not present

## 2022-11-09 DIAGNOSIS — K754 Autoimmune hepatitis: Secondary | ICD-10-CM

## 2022-11-09 DIAGNOSIS — I152 Hypertension secondary to endocrine disorders: Secondary | ICD-10-CM

## 2022-11-09 DIAGNOSIS — E1159 Type 2 diabetes mellitus with other circulatory complications: Secondary | ICD-10-CM | POA: Diagnosis not present

## 2022-11-09 DIAGNOSIS — E785 Hyperlipidemia, unspecified: Secondary | ICD-10-CM

## 2022-11-09 DIAGNOSIS — K219 Gastro-esophageal reflux disease without esophagitis: Secondary | ICD-10-CM

## 2022-11-09 DIAGNOSIS — B351 Tinea unguium: Secondary | ICD-10-CM

## 2022-11-09 DIAGNOSIS — L309 Dermatitis, unspecified: Secondary | ICD-10-CM

## 2022-11-09 LAB — HEPATIC FUNCTION PANEL

## 2022-11-09 LAB — CBC WITH DIFFERENTIAL/PLATELET
Basophils Absolute: 0 10*3/uL (ref 0.0–0.2)
Basos: 1 %
EOS (ABSOLUTE): 0.1 10*3/uL (ref 0.0–0.4)
Eos: 1 %
Hematocrit: 41.6 % (ref 34.0–46.6)
Hemoglobin: 14.2 g/dL (ref 11.1–15.9)
Immature Grans (Abs): 0 10*3/uL (ref 0.0–0.1)
Immature Granulocytes: 0 %
Lymphocytes Absolute: 0.9 10*3/uL (ref 0.7–3.1)
Lymphs: 17 %
MCH: 30.9 pg (ref 26.6–33.0)
MCHC: 34.1 g/dL (ref 31.5–35.7)
MCV: 90 fL (ref 79–97)
Monocytes Absolute: 0.6 10*3/uL (ref 0.1–0.9)
Monocytes: 11 %
Neutrophils Absolute: 4 10*3/uL (ref 1.4–7.0)
Neutrophils: 70 %
Platelets: 178 10*3/uL (ref 150–450)
RBC: 4.6 x10E6/uL (ref 3.77–5.28)
RDW: 11.7 % (ref 11.7–15.4)
WBC: 5.7 10*3/uL (ref 3.4–10.8)

## 2022-11-09 LAB — BAYER DCA HB A1C WAIVED: HB A1C (BAYER DCA - WAIVED): 7 % — ABNORMAL HIGH (ref 4.8–5.6)

## 2022-11-09 LAB — IGG

## 2022-11-09 MED ORDER — PANTOPRAZOLE SODIUM 40 MG PO TBEC
40.0000 mg | DELAYED_RELEASE_TABLET | Freq: Every day | ORAL | 3 refills | Status: DC
Start: 2022-11-09 — End: 2024-02-07

## 2022-11-09 MED ORDER — ATORVASTATIN CALCIUM 10 MG PO TABS
10.0000 mg | ORAL_TABLET | Freq: Every day | ORAL | 3 refills | Status: DC
Start: 2022-11-09 — End: 2024-02-07

## 2022-11-09 MED ORDER — TRIAMCINOLONE ACETONIDE 0.1 % EX CREA
1.0000 | TOPICAL_CREAM | Freq: Two times a day (BID) | CUTANEOUS | 1 refills | Status: AC | PRN
Start: 2022-11-09 — End: ?

## 2022-11-09 MED ORDER — CICLOPIROX 8 % EX SOLN
Freq: Every day | CUTANEOUS | 1 refills | Status: DC
Start: 2022-11-09 — End: 2024-02-07

## 2022-11-09 NOTE — Progress Notes (Signed)
Subjective: CC:DM PCP: Raliegh Ip, DO WUJ:[id number] Taylor Leach is a 67 y.o. female presenting to clinic today for:  1. Type 2 Diabetes with hyperlipidemia, diet-controlled hypertension and autoimmune hepatitis:  Patient reports compliance with Lipitor.  She has not been on the GLP for a while now.  Continues to follow-up with her hepatologist for management of autoimmune disease.  She notes that she was found to have AN unexpectedly low cortisol level recently and has been referred to endocrinologist, whom she will see on October 15.  Her next visit with her hepatologist is 11/23/2022  Diabetes Health Maintenance Due  Topic Date Due   FOOT EXAM  10/30/2022   HEMOGLOBIN A1C  02/05/2023   OPHTHALMOLOGY EXAM  10/17/2023    Last A1c:  Lab Results  Component Value Date   HGBA1C 6.7 (H) 08/05/2022    ROS: Denies dizziness, LOC, polyuria, polydipsia, unintended weight loss/gain, foot ulcerations, numbness or tingling in extremities, shortness of breath or chest pain.   ROS: Per HPI  Allergies  Allergen Reactions   Fenofibrate Micronized Other (See Comments)    Caused increased LFT's Caused increased LFT's   Glipizide     Hypoglycemia    Codeine Nausea And Vomiting   Farxiga [Dapagliflozin]     Bladder pains   Hydrocodone Nausea And Vomiting and Other (See Comments)    Makes body hot.    Janumet [Sitagliptin-Metformin Hcl]     Extremely bad gas    Tape     Can cause skin to tear   Past Medical History:  Diagnosis Date   Arthritis    hands   Atypical chest pain 04/14/2022   See cardiology OV note from 04/14/22 in Epic. Atypical chest pain thought to be related to a hiatal hernia.   Autoimmune hepatitis (HCC)    Follows w/ gastroenterology. LOV 12/14/21 with Annie Sable, AGNP (as of 04/18/22) in Epic.   Chest pain    Stress echo normal, October, 2012   COVID-19 03/2021   flu like symptoms, patient was prescribed an antiviral   Diabetes mellitus (HCC) 07/28/2016    Type 2, follows w/ PCP, Dr. Doylene Canard, LOV 01/28/22 (as of 0/15/24.) HgbA1c 6.8 on 01/28/22.   Dyslipidemia    Follows w/ PCP.   Ejection fraction    EF 55-60%, echo, October, 2012   GERD (gastroesophageal reflux disease)    Hiatal hernia    found during EGD   Idiopathic neuropathy    feet   NASH (nonalcoholic steatohepatitis)    chronic, follows w/ gastro, lov 12/14/21 in Epic   Wears glasses     Current Outpatient Medications:    atorvastatin (LIPITOR) 10 MG tablet, Take 1 tablet (10 mg total) by mouth daily., Disp: 90 tablet, Rfl: 3   blood glucose meter kit and supplies KIT, Dispense based on patient and insurance preference. Use up to four times daily as directed. (FOR ICD-9 250.00, 250.01). E11.9, Disp: 1 each, Rfl: 0   budesonide (ENTOCORT EC) 3 MG 24 hr capsule, Take 3 mg by mouth daily., Disp: , Rfl:    cetirizine (ZYRTEC) 10 MG tablet, Take by mouth as needed., Disp: , Rfl:    CONTOUR NEXT TEST test strip, CHECK BLOOD SUGAR UP TO 4 TIMES A DAY OR AS DIRECTED, Disp: 400 each, Rfl: 2   fluorouracil (EFUDEX) 5 % cream, Apply topically 2 (two) times daily. X2-4 weeks, Disp: 40 g, Rfl: 1   fluticasone (FLONASE) 50 MCG/ACT nasal spray, Place 1 spray into  both nostrils 2 (two) times daily as needed for allergies or rhinitis., Disp: 48 g, Rfl: 1   mycophenolate (MYFORTIC) 360 MG TBEC EC tablet, Take 360 mg by mouth 2 (two) times daily., Disp: , Rfl:    pantoprazole (PROTONIX) 40 MG tablet, Take 1 tablet (40 mg total) by mouth daily., Disp: 90 tablet, Rfl: 3   triamcinolone cream (KENALOG) 0.1 %, Apply 1 Application topically 2 (two) times daily as needed (eczema flare for up to 7 days)., Disp: 80 g, Rfl: 1 Social History   Socioeconomic History   Marital status: Married    Spouse name: Not on file   Number of children: Not on file   Years of education: Not on file   Highest education level: Associate degree: occupational, Scientist, product/process development, or vocational program  Occupational  History   Not on file  Tobacco Use   Smoking status: Former    Current packs/day: 0.00    Average packs/day: 1 pack/day for 10.0 years (10.0 ttl pk-yrs)    Types: Cigarettes    Start date: 14    Quit date: 55    Years since quitting: 36.6   Smokeless tobacco: Never  Vaping Use   Vaping status: Never Used  Substance and Sexual Activity   Alcohol use: Not Currently   Drug use: No   Sexual activity: Yes    Birth control/protection: Surgical  Other Topics Concern   Not on file  Social History Narrative   Patient resides with her spouse.  She has a son and a daughter, both of whom live within 1 hour of her home.  She also has pets.  No fall risks however.  They are well-behaved.  She retired in June 2023.   Social Determinants of Health   Financial Resource Strain: Low Risk  (11/08/2022)   Overall Financial Resource Strain (CARDIA)    Difficulty of Paying Living Expenses: Not hard at all  Food Insecurity: No Food Insecurity (11/08/2022)   Hunger Vital Sign    Worried About Running Out of Food in the Last Year: Never true    Ran Out of Food in the Last Year: Never true  Transportation Needs: No Transportation Needs (11/08/2022)   PRAPARE - Administrator, Civil Service (Medical): No    Lack of Transportation (Non-Medical): No  Physical Activity: Insufficiently Active (11/08/2022)   Exercise Vital Sign    Days of Exercise per Week: 3 days    Minutes of Exercise per Session: 20 min  Stress: No Stress Concern Present (11/08/2022)   [employer] of Occupational Health - Occupational Stress Questionnaire    Feeling of Stress : Not at all  Social Connections: Socially Integrated (11/08/2022)   Social Connection and Isolation Panel [NHANES]    Frequency of Communication with Friends and Family: More than three times a week    Frequency of Social Gatherings with Friends and Family: More than three times a week    Attends Religious Services: More than 4 times per year     Active Member of [employer] or Organizations: Yes    Attends Engineer, structural: More than 4 times per year    Marital Status: Married  Catering manager Violence: Not At Risk (08/23/2021)   [facility] from [facility]   Humiliation, Afraid, Rape, and Kick questionnaire    Fear of Current or Ex-Partner: No    Emotionally Abused: No    Physically Abused: No    Sexually Abused: No  Family History  Problem Relation Age of Onset   Cancer Mother    Cancer Father    Lupus Daughter    Arthritis/Rheumatoid Daughter    Diabetes type II Daughter     Objective: Office vital signs reviewed. BP 134/77   Pulse 67   Temp 98.7 F (37.1 C)   Ht 5\' 5"  (1.651 m)   Wt 199 lb (90.3 kg)   SpO2 97%   BMI 33.12 kg/m   Physical Examination:  General: Awake, alert, obese, No acute distress HEENT: Sclera white.  Moist mucous membranes Cardio: regular rate and rhythm, S1S2 heard, no murmurs appreciated Pulm: clear to auscultation bilaterally, no wheezes, rhonchi or rales; normal work of breathing on room air  Diabetic Foot Exam - Simple   Simple Foot Form Diabetic Foot exam was performed with the following findings: Yes 11/09/2022 11:03 AM  Visual Inspection See comments: Yes Sensation Testing See comments: Yes Pulse Check Posterior Tibialis and Dorsalis pulse intact bilaterally: Yes Comments Onychomycotic changes to bilateral nails of the feet.  She has absent monofilament sensation in the left great toe.  Otherwise monofilament sensation intact      Assessment/ Plan: 67 y.o. female   Type 2 diabetes mellitus with other specified complication, without long-term current use of insulin (HCC) - Plan: Bayer DCA Hb A1c Waived, Bayer DCA Hb A1c Waived  Hypertension associated with diabetes (HCC)  Hyperlipidemia associated with type 2 diabetes mellitus (HCC) - Plan: Hepatic Function Panel, Hepatic Function Panel, atorvastatin (LIPITOR) 10 MG tablet  Autoimmune  hepatitis (HCC) - Plan: IGG, Hepatic Function Panel, CBC with Differential, CBC with Differential, IGG, Hepatic Function Panel  Gastroesophageal reflux disease without esophagitis - Plan: pantoprazole (PROTONIX) 40 MG tablet  Dermatitis - Plan: triamcinolone cream (KENALOG) 0.1 %  Onychomycosis - Plan: ciclopirox (PENLAC) 8 % solution  Sugar is borderline with A1c of 7.0.  I like to see her closely back as a result in 3 months.  Blood pressure diet controlled.  Atorvastatin renewed.  Hepatic function panel collected along with IgG, CBC for her autoimmune hepatitis.  Will CC these to her hepatologist.  Did not discuss GERD, dermatitis but medications were renewed for these issues  She was found to have onychomycotic changes to the nails bilaterally so Penlac solution was prescribed.  She is not appropriate for oral Lamisil given hepatitis  No orders of the defined types were placed in this encounter.  No orders of the defined types were placed in this encounter.    Raliegh Ip, DO [facility] [phone]

## 2022-12-01 ENCOUNTER — Other Ambulatory Visit: Payer: Self-pay | Admitting: Oncology

## 2022-12-01 DIAGNOSIS — Z006 Encounter for examination for normal comparison and control in clinical research program: Secondary | ICD-10-CM

## 2023-01-16 ENCOUNTER — Ambulatory Visit: Payer: Medicare Other | Admitting: Nurse Practitioner

## 2023-01-16 ENCOUNTER — Encounter: Payer: Self-pay | Admitting: Nurse Practitioner

## 2023-01-16 ENCOUNTER — Telehealth: Payer: Self-pay | Admitting: Family Medicine

## 2023-01-16 VITALS — BP 121/73 | HR 88 | Temp 97.2°F | Ht 65.0 in | Wt 200.8 lb

## 2023-01-16 DIAGNOSIS — H60312 Diffuse otitis externa, left ear: Secondary | ICD-10-CM | POA: Insufficient documentation

## 2023-01-16 MED ORDER — CIPRO HC 0.2-1 % OT SUSP
3.0000 [drp] | Freq: Two times a day (BID) | OTIC | 0 refills | Status: DC
Start: 2023-01-16 — End: 2023-01-16

## 2023-01-16 MED ORDER — OFLOXACIN 0.3 % OT SOLN
5.0000 [drp] | Freq: Every day | OTIC | 0 refills | Status: DC
Start: 2023-01-16 — End: 2023-06-14

## 2023-01-16 NOTE — Telephone Encounter (Signed)
Taylor Leach saw her today. Think this was sent to me by mistake

## 2023-01-16 NOTE — Progress Notes (Signed)
Established Patient Office Visit  Subjective   Patient ID: Taylor Leach, female    DOB: 1955-04-21  Age: 67 y.o. MRN: [id number]  Chief Complaint  Patient presents with   Ear Pain    Left ear pain for about 4-5 days radiates to top of head    HPImary Leach is a  67 y.o. female complains of pain in left ear for 5- days. No fever or URI symptoms. Has been swimming.   Patient Active Problem List   Diagnosis Date Noted   Acute diffuse otitis externa of left ear 01/16/2023   Newly recognized heart murmur 08/05/2022   Vaginal vault prolapse after hysterectomy 04/20/2022   Nonalcoholic fatty liver disease without nonalcoholic steatohepatitis (NASH) 08/08/2018   Pain of right hip joint 11/30/2017   Diabetes mellitus (HCC) 07/28/2016   Thyroid nodule 07/28/2016   Pure hypercholesterolemia 05/18/2016   Chronic allergic rhinitis due to pollen 05/18/2016   Autoimmune hepatitis (HCC)    Hyperlipidemia associated with type 2 diabetes mellitus (HCC) 09/04/2008   Hypertension associated with diabetes (HCC) 09/04/2008   Gastroesophageal reflux disease without esophagitis 09/04/2008   HIATAL HERNIA 09/04/2008   Past Medical History:  Diagnosis Date   Arthritis    hands   Atypical chest pain 04/14/2022   See cardiology OV note from 04/14/22 in Epic. Atypical chest pain thought to be related to a hiatal hernia.   Autoimmune hepatitis (HCC)    Follows w/ gastroenterology. LOV 12/14/21 with Annie Sable, AGNP (as of 04/18/22) in Epic.   Chest pain    Stress echo normal, October, 2012   COVID-19 03/2021   flu like symptoms, patient was prescribed an antiviral   Diabetes mellitus (HCC) 07/28/2016   Type 2, follows w/ PCP, Dr. Doylene Canard, LOV 01/28/22 (as of 0/15/24.) HgbA1c 6.8 on 01/28/22.   Dyslipidemia    Follows w/ PCP.   Ejection fraction    EF 55-60%, echo, October, 2012   GERD (gastroesophageal reflux disease)    Hiatal hernia    found during EGD   Idiopathic neuropathy     feet   NASH (nonalcoholic steatohepatitis)    chronic, follows w/ gastro, lov 12/14/21 in Epic   Wears glasses    Past Surgical History:  Procedure Laterality Date   CHOLECYSTECTOMY  2008   COLONOSCOPY  06/10/2019   multiple colon polyps   CYSTOSCOPY N/A 04/20/2022   Procedure: CYSTOSCOPY;  Surgeon: Marguerita Beards, MD;  Location: [facility];  Service: Gynecology;  Laterality: N/A;   ESOPHAGOGASTRODUODENOSCOPY     around 2018   EYE SURGERY Bilateral 07/15/2021   eye lids   LIVER BIOPSY  2005   LIVER BIOPSY  2019   steatohepatitis & moderate lymphoplasmacytic infiltrate & stage 2 -3 fibrosis   NASAL SINUS SURGERY     around 2001   [facility]  [facility]   ROBOTIC ASSISTED LAPAROSCOPIC SACROCOLPOPEXY N/A 04/20/2022   Procedure: XI ROBOTIC ASSISTED LAPAROSCOPIC SACROCOLPOPEXY;  Surgeon: Marguerita Beards, MD;  Location: [facility];  Service: Gynecology;  Laterality: N/A;   VAGINAL HYSTERECTOMY  1999   Social History   Tobacco Use   Smoking status: Former    Current packs/day: 0.00    Average packs/day: 1 pack/day for 10.0 years (10.0 ttl pk-yrs)    Types: Cigarettes    Start date: 72    Quit date: 31    Years since quitting: 36.8   Smokeless tobacco:  Never  Vaping Use   Vaping status: Never Used  Substance Use Topics   Alcohol use: Not Currently   Drug use: No   Social History   Socioeconomic History   Marital status: Married    Spouse name: Not on file   Number of children: Not on file   Years of education: Not on file   Highest education level: Associate degree: occupational, Scientist, product/process development, or vocational program  Occupational History   Not on file  Tobacco Use   Smoking status: Former    Current packs/day: 0.00    Average packs/day: 1 pack/day for 10.0 years (10.0 ttl pk-yrs)    Types: Cigarettes    Start date: 67    Quit date: 44    Years since quitting: 36.8    Smokeless tobacco: Never  Vaping Use   Vaping status: Never Used  Substance and Sexual Activity   Alcohol use: Not Currently   Drug use: No   Sexual activity: Yes    Birth control/protection: Surgical  Other Topics Concern   Not on file  Social History Narrative   Patient resides with her spouse.  She has a son and a daughter, both of whom live within 1 hour of her home.  She also has pets.  No fall risks however.  They are well-behaved.  She retired in June 2023.   Social Determinants of Health   Financial Resource Strain: Low Risk  (11/08/2022)   Overall Financial Resource Strain (CARDIA)    Difficulty of Paying Living Expenses: Not hard at all  Food Insecurity: No Food Insecurity (11/08/2022)   Hunger Vital Sign    Worried About Running Out of Food in the Last Year: Never true    Ran Out of Food in the Last Year: Never true  Transportation Needs: No Transportation Needs (11/08/2022)   PRAPARE - Administrator, Civil Service (Medical): No    Lack of Transportation (Non-Medical): No  Physical Activity: Insufficiently Active (11/08/2022)   Exercise Vital Sign    Days of Exercise per Week: 3 days    Minutes of Exercise per Session: 20 min  Stress: No Stress Concern Present (11/08/2022)   [employer] of Occupational Health - Occupational Stress Questionnaire    Feeling of Stress : Not at all  Social Connections: Socially Integrated (11/08/2022)   Social Connection and Isolation Panel [NHANES]    Frequency of Communication with Friends and Family: More than three times a week    Frequency of Social Gatherings with Friends and Family: More than three times a week    Attends Religious Services: More than 4 times per year    Active Member of [employer] or Organizations: Yes    Attends Engineer, structural: More than 4 times per year    Marital Status: Married  Catering manager Violence: Not At Risk (08/23/2021)   Received from [facility]    Humiliation, Afraid, Rape, and Kick questionnaire    Fear of Current or Ex-Partner: No    Emotionally Abused: No    Physically Abused: No    Sexually Abused: No   Family Status  Relation Name Status   Mother  Deceased   Father  Deceased   Daughter  Alive   Son  Alive  No partnership data on file   Family History  Problem Relation Age of Onset   Cancer Mother    Cancer Father    Lupus Daughter    Arthritis/Rheumatoid Daughter  Diabetes type II Daughter    Allergies  Allergen Reactions   Fenofibrate Micronized Other (See Comments)    Caused increased LFT's Caused increased LFT's   Glipizide     Hypoglycemia    Codeine Nausea And Vomiting   Farxiga [Dapagliflozin]     Bladder pains   Hydrocodone Nausea And Vomiting and Other (See Comments)    Makes body hot.    Janumet [Sitagliptin-Metformin Hcl]     Extremely bad gas    Tape     Can cause skin to tear      ROS Negative unless indicated in HPI   Objective:     BP 121/73   Pulse 88   Temp (!) 97.2 F (36.2 C) (Temporal)   Ht 5\' 5"  (1.651 m)   Wt 200 lb 12.8 oz (91.1 kg)   SpO2 95%   BMI 33.41 kg/m  BP Readings from Last 3 Encounters:  01/16/23 121/73  11/09/22 134/77  10/17/22 118/82   Wt Readings from Last 3 Encounters:  01/16/23 200 lb 12.8 oz (91.1 kg)  11/09/22 199 lb (90.3 kg)  10/17/22 200 lb 6.4 oz (90.9 kg)      Physical Exam  She appears well, afebrile. Right ear reveals tenderness of the tragus; debris and inflammation in external canal. TM is well seen visualized aspects appear normal. No results found for any visits on 01/16/23.  Last CBC Lab Results  Component Value Date   WBC 5.7 11/09/2022   HGB 14.2 11/09/2022   HCT 41.6 11/09/2022   MCV 90 11/09/2022   MCH 30.9 11/09/2022   RDW 11.7 11/09/2022   PLT 178 11/09/2022   Last metabolic panel Lab Results  Component Value Date   GLUCOSE 120 (H) 06/06/2022   NA 135 06/06/2022   K 3.3 (L) 06/06/2022   CL 101 06/06/2022    CO2 27 06/06/2022   BUN 10 06/06/2022   CREATININE 0.96 06/06/2022   GFRNONAA >60 06/06/2022   CALCIUM 9.1 06/06/2022   PROT 7.3 11/09/2022   ALBUMIN 4.1 11/09/2022   LABGLOB 3.1 05/23/2022   AGRATIO 1.3 05/23/2022   BILITOT 0.6 11/09/2022   ALKPHOS 83 11/09/2022   AST 27 11/09/2022   ALT 30 11/09/2022   ANIONGAP 7 06/06/2022   Last lipids Lab Results  Component Value Date   CHOL 148 08/05/2022   HDL 52 08/05/2022   LDLCALC 74 08/05/2022   TRIG 126 08/05/2022   CHOLHDL 2.8 08/05/2022   Last hemoglobin A1c Lab Results  Component Value Date   HGBA1C 7.0 (H) 11/09/2022   Last thyroid functions Lab Results  Component Value Date   TSH 2.440 08/05/2022   T4TOTAL 7.8 08/05/2022        Assessment & Plan:  Acute diffuse otitis externa of left ear -     Ofloxacin; Place 5 drops into the left ear daily.  Dispense: 5 mL; Refill: 0    Taylor Leach is 67 yrs old caucasian female, no acute distress Otitis Externa Ofloxacin drops   Instructed to keep ear dry until better; eardrops per orders, call if persistent pain, swelling or fever, FUV prn.  Continue healthy lifestyle choices, including diet (rich in fruits, vegetables, and lean proteins, and low in salt and simple carbohydrates) and exercise (at least 30 minutes of moderate physical activity daily).     The above assessment and management plan was discussed with the patient. The patient verbalized understanding of and has agreed to the management plan. Patient is aware to call  the clinic if they develop any new symptoms or if symptoms persist or worsen. Patient is aware when to return to the clinic for a follow-up visit. Patient educated on when it is appropriate to go to the emergency department.  Return for as already scheduled with PCP.    Arrie Aran Santa Lighter, DNP [facility] [address] [place], [place] [address] [phone]

## 2023-01-17 ENCOUNTER — Other Ambulatory Visit (HOSPITAL_COMMUNITY): Payer: Medicare Other | Attending: Oncology

## 2023-01-17 ENCOUNTER — Ambulatory Visit (INDEPENDENT_AMBULATORY_CARE_PROVIDER_SITE_OTHER): Payer: Medicare Other | Admitting: "Endocrinology

## 2023-01-17 ENCOUNTER — Encounter: Payer: Self-pay | Admitting: "Endocrinology

## 2023-01-17 VITALS — BP 132/80 | HR 72 | Ht 65.0 in | Wt 202.8 lb

## 2023-01-17 DIAGNOSIS — E2749 Other adrenocortical insufficiency: Secondary | ICD-10-CM | POA: Diagnosis not present

## 2023-01-17 NOTE — Progress Notes (Signed)
Endocrinology Consult Note                                            01/17/2023, 3:39 PM   Subjective:    Patient ID: Taylor Leach, female    DOB: [birth year], PCP Raliegh Ip, DO   Past Medical History:  Diagnosis Date   Arthritis    hands   Atypical chest pain 04/14/2022   See cardiology OV note from 04/14/22 in Epic. Atypical chest pain thought to be related to a hiatal hernia.   Autoimmune hepatitis (HCC)    Follows w/ gastroenterology. LOV 12/14/21 with Annie Sable, AGNP (as of 04/18/22) in Epic.   Chest pain    Stress echo normal, October, 2012   COVID-19 03/2021   flu like symptoms, patient was prescribed an antiviral   Diabetes mellitus (HCC) 07/28/2016   Type 2, follows w/ PCP, Dr. Doylene Canard, LOV 01/28/22 (as of 0/15/24.) HgbA1c 6.8 on 01/28/22.   Dyslipidemia    Follows w/ PCP.   Ejection fraction    EF 55-60%, echo, October, 2012   GERD (gastroesophageal reflux disease)    Hiatal hernia    found during EGD   Idiopathic neuropathy    feet   NASH (nonalcoholic steatohepatitis)    chronic, follows w/ gastro, lov 12/14/21 in Epic   Wears glasses    Past Surgical History:  Procedure Laterality Date   CHOLECYSTECTOMY  2008   COLONOSCOPY  06/10/2019   multiple colon polyps   CYSTOSCOPY N/A 04/20/2022   Procedure: CYSTOSCOPY;  Surgeon: Marguerita Beards, MD;  Location: [facility];  Service: Gynecology;  Laterality: N/A;   ESOPHAGOGASTRODUODENOSCOPY     around 2018   EYE SURGERY Bilateral 07/15/2021   eye lids   LIVER BIOPSY  2005   LIVER BIOPSY  2019   steatohepatitis & moderate lymphoplasmacytic infiltrate & stage 2 -3 fibrosis   NASAL SINUS SURGERY     around 2001   RECTOCELE REPAIR  2014   [facility]   ROBOTIC ASSISTED LAPAROSCOPIC SACROCOLPOPEXY N/A 04/20/2022   Procedure: XI ROBOTIC ASSISTED LAPAROSCOPIC SACROCOLPOPEXY;  Surgeon: Marguerita Beards, MD;  Location: [facility];  Service: Gynecology;  Laterality: N/A;   VAGINAL HYSTERECTOMY  1999   Social History   Socioeconomic History   Marital status: Married    Spouse name: Not on file   Number of children: Not on file   Years of education: Not on file   Highest education level: Associate degree: occupational, Scientist, product/process development, or vocational program  Occupational History   Not on file  Tobacco Use   Smoking status: Former    Current packs/day: 0.00    Average packs/day: 1 pack/day for 10.0 years (10.0 ttl pk-yrs)    Types: Cigarettes    Start date: 18    Quit date: 77    Years since quitting: 36.8   Smokeless tobacco: Never  Vaping Use   Vaping status: Never Used  Substance and Sexual Activity   Alcohol use: Not Currently   Drug use: No   Sexual activity: Yes    Birth control/protection: Surgical  Other Topics Concern   Not on file  Social History Narrative   Patient resides with her spouse.  She has a son and a daughter, both of whom live within  1 hour of her home.  She also has pets.  No fall risks however.  They are well-behaved.  She retired in June 2023.   Social Determinants of Health   Financial Resource Strain: Low Risk  (11/08/2022)   Overall Financial Resource Strain (CARDIA)    Difficulty of Paying Living Expenses: Not hard at all  Food Insecurity: No Food Insecurity (11/08/2022)   Hunger Vital Sign    Worried About Running Out of Food in the Last Year: Never true    Ran Out of Food in the Last Year: Never true  Transportation Needs: No Transportation Needs (11/08/2022)   PRAPARE - Administrator, Civil Service (Medical): No    Lack of Transportation (Non-Medical): No  Physical Activity: Insufficiently Active (11/08/2022)   Exercise Vital Sign    Days of Exercise per Week: 3 days    Minutes of Exercise per Session: 20 min  Stress: No Stress Concern Present (11/08/2022)   [employer] of Occupational Health - Occupational Stress Questionnaire    Feeling of  Stress : Not at all  Social Connections: Socially Integrated (11/08/2022)   Social Connection and Isolation Panel [NHANES]    Frequency of Communication with Friends and Family: More than three times a week    Frequency of Social Gatherings with Friends and Family: More than three times a week    Attends Religious Services: More than 4 times per year    Active Member of [employer] or Organizations: Yes    Attends Engineer, structural: More than 4 times per year    Marital Status: Married   Family History  Problem Relation Age of Onset   Thyroid disease Mother    Diabetes Mother    Cancer Mother    Cancer Father    Lupus Daughter    Arthritis/Rheumatoid Daughter    Diabetes type II Daughter    Outpatient Encounter Medications as of 01/17/2023  Medication Sig   Cholecalciferol (VITAMIN D-3 PO) Take 1 tablet by mouth daily.   atorvastatin (LIPITOR) 10 MG tablet Take 1 tablet (10 mg total) by mouth daily.   blood glucose meter kit and supplies KIT Dispense based on patient and insurance preference. Use up to four times daily as directed. (FOR ICD-9 250.00, 250.01). E11.9   budesonide (ENTOCORT EC) 3 MG 24 hr capsule Take 3 mg by mouth daily.   cetirizine (ZYRTEC) 10 MG tablet Take by mouth as needed.   ciclopirox (PENLAC) 8 % solution Apply topically at bedtime. Apply over nail and surrounding skin. Apply daily over previous coat. After seven (7) days, may remove with alcohol and continue cycle.   CONTOUR NEXT TEST test strip CHECK BLOOD SUGAR UP TO 4 TIMES A DAY OR AS DIRECTED   fluorouracil (EFUDEX) 5 % cream Apply topically 2 (two) times daily. X2-4 weeks   fluticasone (FLONASE) 50 MCG/ACT nasal spray Place 1 spray into both nostrils 2 (two) times daily as needed for allergies or rhinitis.   mycophenolate (MYFORTIC) 360 MG TBEC EC tablet Take 360 mg by mouth 2 (two) times daily.   ofloxacin (FLOXIN) 0.3 % OTIC solution Place 5 drops into the left ear daily.   pantoprazole  (PROTONIX) 40 MG tablet Take 1 tablet (40 mg total) by mouth daily.   triamcinolone cream (KENALOG) 0.1 % Apply 1 Application topically 2 (two) times daily as needed (eczema flare for up to 7 days).   No facility-administered encounter medications on file as of 01/17/2023.   ALLERGIES:  Allergies  Allergen Reactions   Fenofibrate Micronized Other (See Comments)    Caused increased LFT's Caused increased LFT's   Glipizide     Hypoglycemia    Codeine Nausea And Vomiting   Farxiga [Dapagliflozin]     Bladder pains   Hydrocodone Nausea And Vomiting and Other (See Comments)    Makes body hot.    Janumet [Sitagliptin-Metformin Hcl]     Extremely bad gas    Tape     Can cause skin to tear    VACCINATION STATUS: Immunization History  Administered Date(s) Administered   Hep A / Hep B 03/24/2008, 04/23/2008, 09/08/2008   Influenza, Seasonal, Injecte, Preservative Fre 01/10/2008   Influenza,inj,Quad PF,6+ Mos 05/18/2016   Influenza-Unspecified 02/02/2018   PFIZER(Purple Top)SARS-COV-2 Vaccination 11/08/2019, 11/29/2019   PNEUMOCOCCAL CONJUGATE-20 10/29/2021   Pneumococcal Polysaccharide-23 11/30/2017   Zoster Recombinant(Shingrix) 12/12/2018, 06/12/2019    HPI JERZY CROTTEAU is 67 y.o. female who presents today with a medical history as above. she is being seen in consultation for hypothyroidism requested by Raliegh Ip, DO.  She is accompanied today by her daughter to clinic.  History is obtained from the family as well as chart review.  She gives history of exposure to high-dose steroids related to her autoimmune hepatitis in the past.  Although her exposure ranges between 6 to 7 years Mizera or prednisone as high as 60 mg daily at times. She is currently on budesonide 3 mg p.o. daily. In the middle of July, she developed dizziness, lightheadedness, which led to ER visit.  Her blood work showed suboptimal a.m. cortisol at 1.9.  She was never diagnosed with adrenal insufficiency.   She is not initiated on any steroids. She denies family history of adrenal dysfunction.  Her other medical problems include type 2 diabetes and hyperlipidemia on treatment.  She does not have hypertension.  Her current medications include Myfortic acid, Protonix, budesonide, vitamin D.  Her most recent A1c measurements range between 6-7%.  She is not on medications for diabetes at this time.  Review of Systems  Constitutional: +mildly fluctuating body weight ,  + fatigue, no subjective hyperthermia, no subjective hypothermia Eyes: no blurry vision, no xerophthalmia ENT: no sore throat, no nodules palpated in throat, no dysphagia/odynophagia, no hoarseness Cardiovascular: no Chest Pain, no Shortness of Breath, no palpitations, no leg swelling Respiratory: no cough, no shortness of breath Gastrointestinal: no Nausea/Vomiting/Diarhhea Musculoskeletal: no muscle/joint aches Skin: no rashes Neurological: no tremors, no numbness, no tingling, no dizziness Psychiatric: no depression, no anxiety  Objective:       01/17/2023    2:08 PM 01/16/2023    9:04 AM 11/09/2022    8:19 AM  Vitals with BMI  Height 5\' 5"  5\' 5"  5\' 5"   Weight 202 lbs 13 oz 200 lbs 13 oz 199 lbs  BMI 33.75 33.41 33.12  Systolic 132 121 161  Diastolic 80 73 77  Pulse 72 88 67    BP 132/80   Pulse 72   Ht 5\' 5"  (1.651 m)   Wt 202 lb 12.8 oz (92 kg)   BMI 33.75 kg/m   Wt Readings from Last 3 Encounters:  01/17/23 202 lb 12.8 oz (92 kg)  01/16/23 200 lb 12.8 oz (91.1 kg)  11/09/22 199 lb (90.3 kg)    Physical Exam  Constitutional:  Body mass index is 33.75 kg/m.,  not in acute distress, normal state of mind Eyes: PERRLA, EOMI, no exophthalmos ENT: moist mucous membranes, no gross thyromegaly, no gross  cervical lymphadenopathy Cardiovascular: normal precordial activity, Regular Rate and Rhythm, no Murmur/Rubs/Gallops Respiratory:  adequate breathing efforts, no gross chest deformity, Clear to auscultation  bilaterally Gastrointestinal: abdomen soft, Non -tender, No distension, Bowel Sounds present, no gross organomegaly Musculoskeletal: no gross deformities, strength intact in all four extremities, no peripheral edema Skin: moist, warm, no rashes Neurological: no tremor with outstretched hands, Deep tendon reflexes normal in bilateral lower extremities.  CMP ( most recent) CMP     Component Value Date/Time   NA 135 06/06/2022 1848   NA 138 05/23/2022 1241   K 3.3 (L) 06/06/2022 1848   CL 101 06/06/2022 1848   CO2 27 06/06/2022 1848   GLUCOSE 120 (H) 06/06/2022 1848   BUN 10 06/06/2022 1848   BUN 11 05/23/2022 1241   CREATININE 0.96 06/06/2022 1848   CALCIUM 9.1 06/06/2022 1848   PROT 7.3 11/09/2022 0817   ALBUMIN 4.1 11/09/2022 0817   AST 27 11/09/2022 0817   ALT 30 11/09/2022 0817   ALKPHOS 83 11/09/2022 0817   BILITOT 0.6 11/09/2022 0817   EGFR 55 (L) 05/23/2022 1241   GFRNONAA >60 06/06/2022 1848     Diabetic Labs (most recent): Lab Results  Component Value Date   HGBA1C 7.0 (H) 11/09/2022   HGBA1C 6.7 (H) 08/05/2022   HGBA1C 6.9 (H) 05/03/2022     Lipid Panel ( most recent) Lipid Panel     Component Value Date/Time   CHOL 148 08/05/2022 0934   TRIG 126 08/05/2022 0934   HDL 52 08/05/2022 0934   CHOLHDL 2.8 08/05/2022 0934   CHOLHDL 4.1 10/23/2010 1748   VLDL 40 10/23/2010 1748   LDLCALC 74 08/05/2022 0934   LABVLDL 22 08/05/2022 0934      Lab Results  Component Value Date   TSH 2.440 08/05/2022   TSH 1.747 06/06/2022   TSH 1.990 07/16/2019   TSH 4.050 06/07/2019   TSH 2.640 08/10/2018   FREET4 1.33 08/05/2022           Assessment & Plan:   Hypocortisolism  - Taylor Leach  is being seen at a kind request of Gottschalk, Ashly M, DO. - I have reviewed her available  records and clinically evaluated the patient. - Based on these reviews, she has suboptimal a.m. cortisol,  however,  there is not sufficient information to proceed with definitive  treatment plan. -She does have a clear risk factor for adrenal insufficiency.  She has several years of exposure to high-dose steroids orally to treat autoimmune hepatitis.  She will need confirmatory ACTH stimulation test before subjecting her for lifelong steroid treatment. This test will be done in the next several days and patient will return in 2 to 3 weeks to review her results and treatment decision if necessary.   - I did not initiate any new prescriptions today. - she is advised to maintain close follow up with Raliegh Ip, DO for primary care needs.   -Thank you for involving me in the care of this pleasant patient.  Time spent with the patient: 50  minutes, of which >50% was spent in  counseling her about her hypocortisolism and the rest in obtaining information about her symptoms, reviewing her previous labs/studies ( including abstractions from other facilities),  evaluations, and treatments,  and developing a plan to confirm diagnosis and long term treatment based on the latest standards of care/guidelines; and documenting her care.  Taylor Leach participated in the discussions, expressed understanding, and voiced agreement with the above  plans.  All questions were answered to her satisfaction. she is encouraged to contact clinic should she have any questions or concerns prior to her return visit.  Follow up plan: Return in about 3 weeks (around 02/07/2023) for F/U with Pre-visit Labs.   Marquis Lunch, MD [facility] [address] [place], [place] [address] Phone: [phone]  Fax: [phone]     01/17/2023, 3:39 PM  This note was partially dictated with voice recognition software. Similar sounding words can be transcribed inadequately or may not  be corrected upon review.

## 2023-01-18 ENCOUNTER — Other Ambulatory Visit: Payer: Self-pay

## 2023-01-20 ENCOUNTER — Other Ambulatory Visit (HOSPITAL_COMMUNITY)
Admission: RE | Admit: 2023-01-20 | Discharge: 2023-01-20 | Disposition: A | Discharge status: Home / Self Care | Payer: Medicare Other | Source: Other Acute Inpatient Hospital | Attending: Family Medicine | Admitting: Family Medicine

## 2023-01-20 ENCOUNTER — Other Ambulatory Visit: Payer: Self-pay | Admitting: *Deleted

## 2023-01-20 ENCOUNTER — Encounter: Payer: Medicare Other | Attending: Family Medicine | Admitting: Internal Medicine

## 2023-01-20 VITALS — BP 161/77 | HR 66 | Temp 97.8°F

## 2023-01-20 DIAGNOSIS — E2749 Other adrenocortical insufficiency: Secondary | ICD-10-CM | POA: Diagnosis present

## 2023-01-20 DIAGNOSIS — E270 Other adrenocortical overactivity: Secondary | ICD-10-CM

## 2023-01-20 LAB — ACTH STIMULATION, 3 TIME POINTS
Cortisol, 30 Min: 10.5 ug/dL
Cortisol, 60 Min: 12.8 ug/dL
Cortisol, Base: 4.9 ug/dL

## 2023-01-20 MED ORDER — COSYNTROPIN 0.25 MG IJ SOLR
0.2500 mg | Freq: Once | INTRAMUSCULAR | Status: AC
  Administered 2023-01-20: 0.25 mg via INTRAMUSCULAR
  Filled 2023-01-20: qty 0.25

## 2023-01-20 NOTE — Progress Notes (Signed)
Diagnosis: Hypercortisolemia  Provider:   Roma Kayser, MD  Procedure: Injection  Cortrosyn (cosyntropin), Dose: 0.25mg  , Site: intramuscular, Number of injections: 1  Post Care: Observation period completed  Discharge: Condition: Good, Destination: Home . AVS Provided  Performed by:  Feliberto Harts, LPN

## 2023-01-29 IMAGING — US US THYROID
1 series · 13 of 25 positions shown · non-contrast
Comparison: 10/09/2019

CLINICAL DATA: Thyroid nodule follow-up

EXAM:
THYROID ULTRASOUND
TECHNIQUE: Ultrasound examination of the thyroid gland and adjacent soft
tissues was performed.

[Series 1: us thyroid · 0.08mm/px · 13 of 69 slices shown]
[im 1/69]
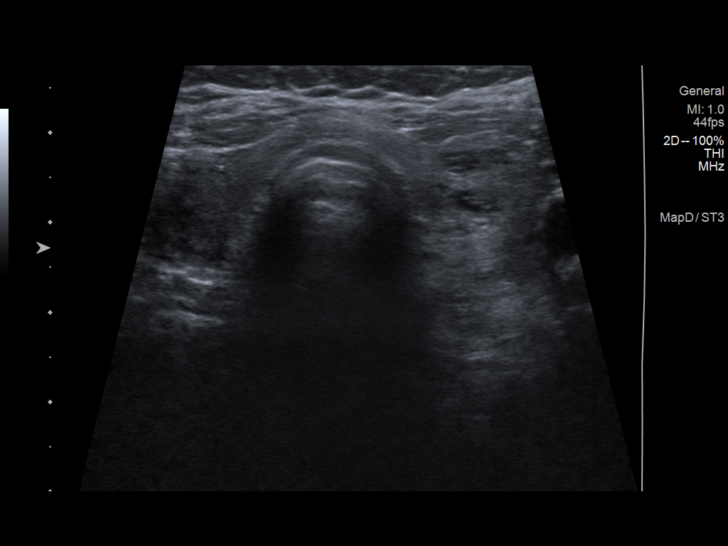
[im 6/69]
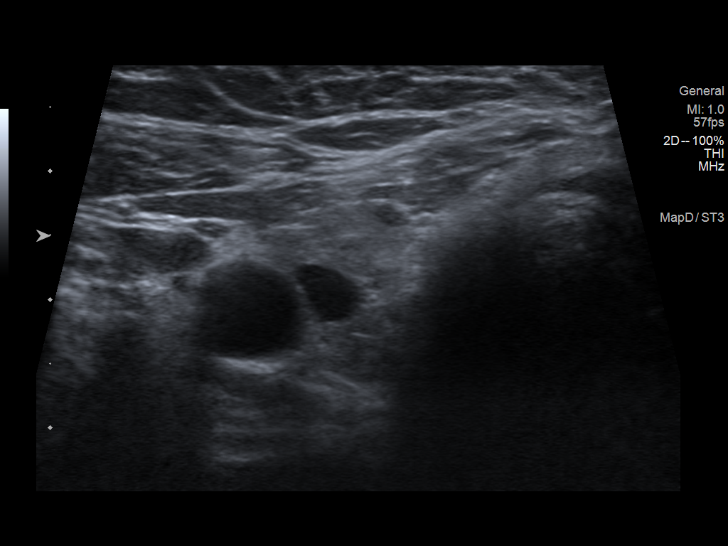
[im 12/69]
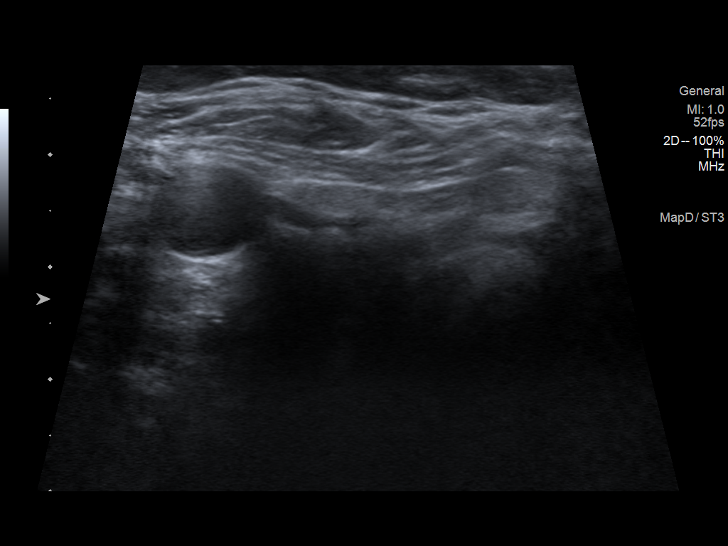
[im 18/69]
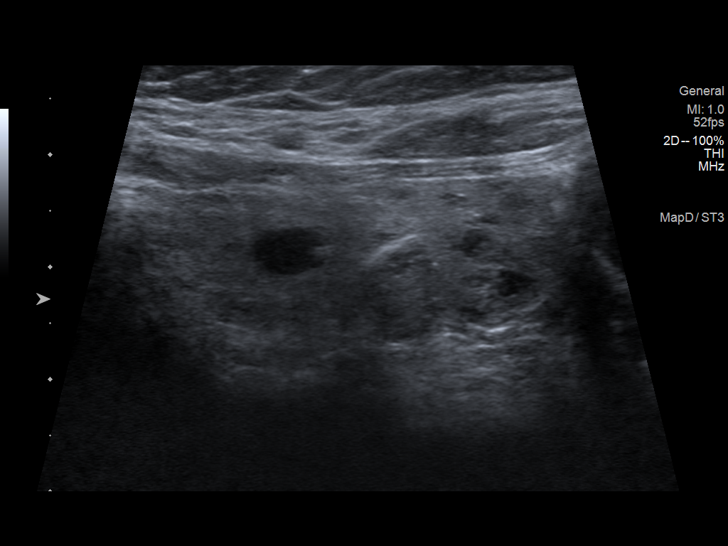
[im 23/69]
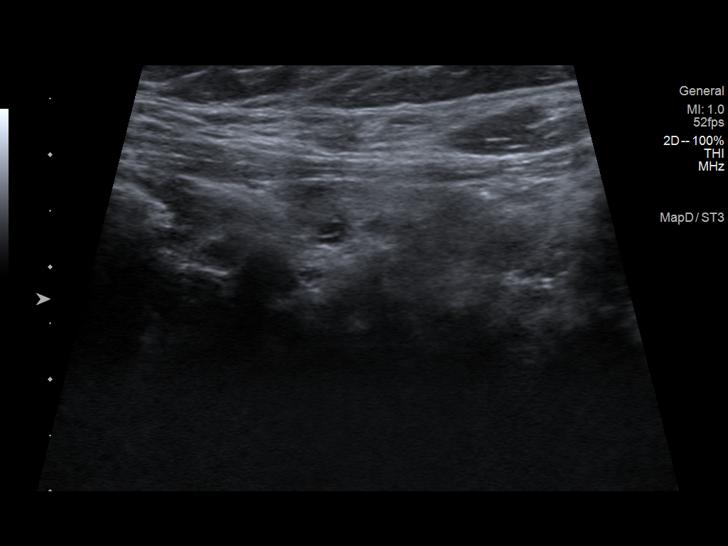
[im 29/69]
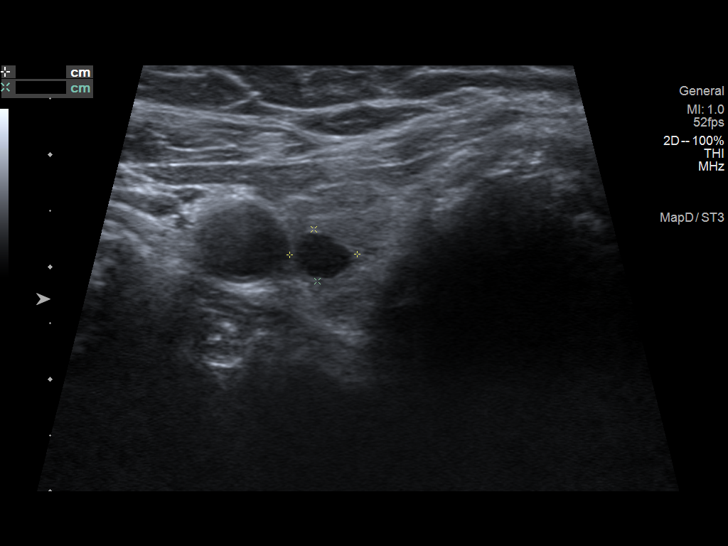
[im 35/69]
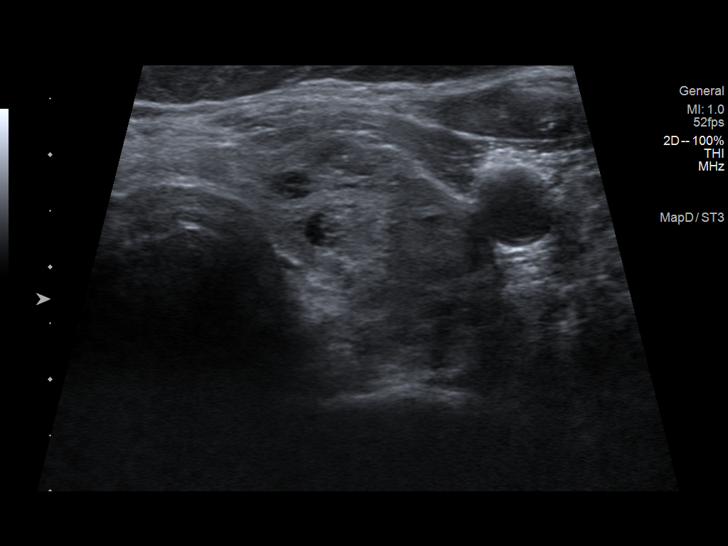
[im 40/69]
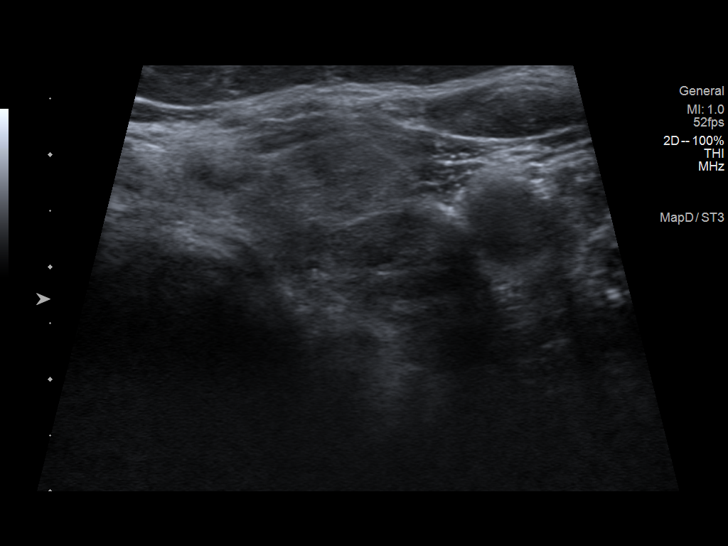
[im 46/69]
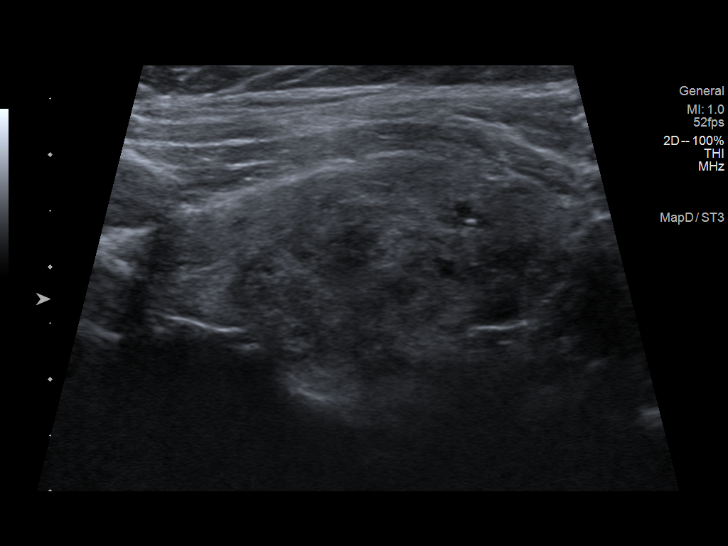
[im 52/69]
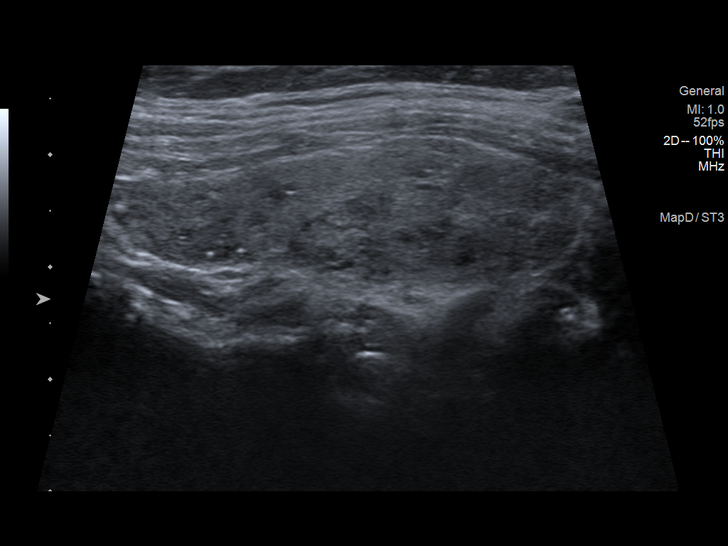
[im 57/69]
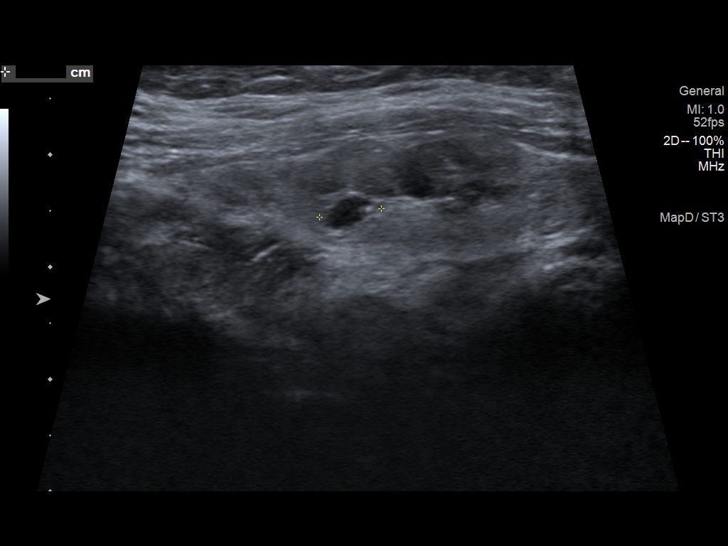
[im 63/69]
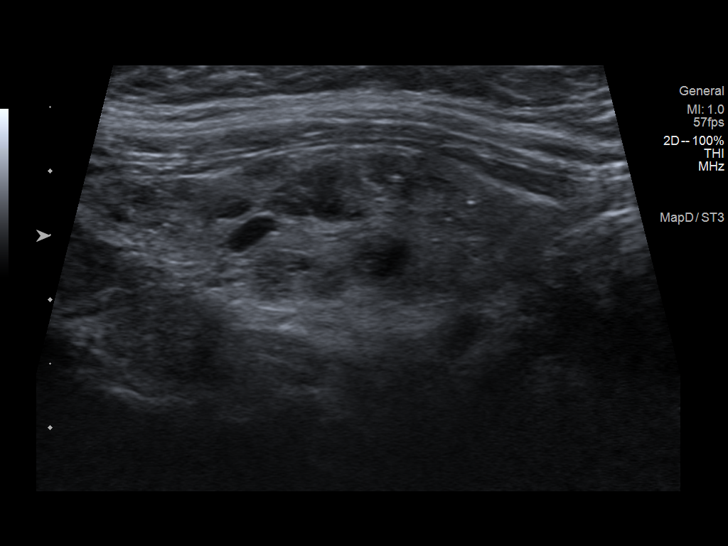
[im 69/69]
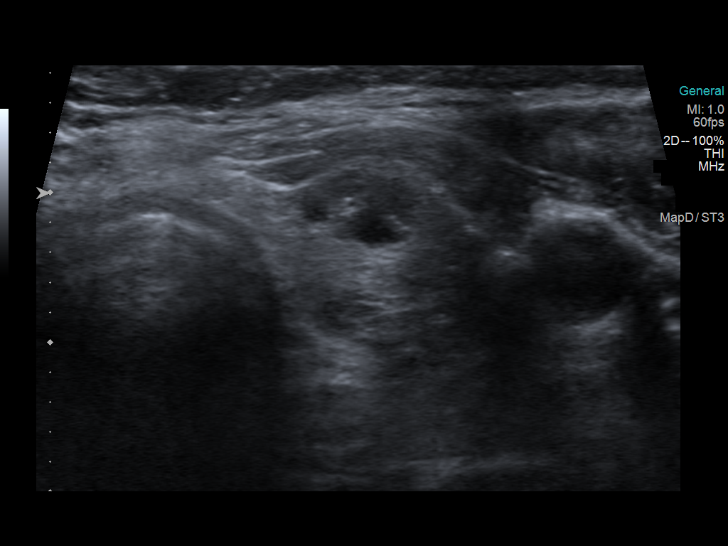

[13 of 25 positions shown; findings below may reference images not displayed]

FINDINGS: Parenchymal Echotexture: Moderately heterogeneous

Isthmus: 0.3 cm

Right lobe: 4.0 x 1.4 x 2.1 cm

Left lobe: 4.5 x 1.7 x 2.0 cm

_________________________________________________________

Estimated total number of nodules >/= 1 cm: 1

Number of spongiform nodules >/=  2 cm not described below (TR1): 0

Number of mixed cystic and solid nodules >/= 1.5 cm not described
below (TR2): 0

_________________________________________________________

Nodule # 2:

Prior biopsy: No

Location: Superior; left

Maximum size: 1.1 cm; Other 2 dimensions: 0.8 x 0.5 cm, previously,
1.2 x 0.9 x 0.5 cm on 10/09/2019 and 0.8 x 0.8 x 0.4 cm on
08/10/2016.

Composition: solid/almost completely solid (2)

Echogenicity: hypoechoic (2)

Shape: not taller-than-wide (0)

Margins: smooth (0)

Echogenic foci: none (0)

ACR TI-RADS total points: 4.

ACR TI-RADS risk category:  TR4 (4-6 points).

Significant change in size (>/= 20% in two dimensions and minimal
increase of 2 mm): No

Change in features: No

Change in ACR TI-RADS risk category: No

ACR TI-RADS recommendations:

*Given size (>/= 1 - 1.4 cm) and appearance, a follow-up ultrasound
in 1 year should be considered based on TI-RADS criteria.

_________________________________________________________

Remaining subcentimeter cystic thyroid nodules do not meet criteria
for FNA or imaging surveillance.
IMPRESSION: Nodule 2 (TI-RADS 4) located in the superior left thyroid lobe is
not significantly changed in size since 08/10/2016. Follow-up
ultrasound of the thyroid should be performed in 1 year to document
5 years of stability, which would confirm that this is a benign
nodule.

The above is in keeping with the ACR TI-RADS recommendations - [HOSPITAL] 9245;[DATE].

## 2023-02-02 ENCOUNTER — Encounter: Payer: Self-pay | Admitting: Cardiology

## 2023-02-02 ENCOUNTER — Encounter (INDEPENDENT_AMBULATORY_CARE_PROVIDER_SITE_OTHER): Payer: Medicare Other | Admitting: Cardiology

## 2023-02-02 VITALS — BP 150/90 | HR 60 | Ht 65.0 in | Wt 204.4 lb

## 2023-02-02 DIAGNOSIS — E2749 Other adrenocortical insufficiency: Secondary | ICD-10-CM | POA: Diagnosis not present

## 2023-02-02 DIAGNOSIS — R42 Dizziness and giddiness: Secondary | ICD-10-CM | POA: Diagnosis not present

## 2023-02-02 DIAGNOSIS — R002 Palpitations: Secondary | ICD-10-CM | POA: Diagnosis not present

## 2023-02-02 NOTE — Progress Notes (Signed)
Clinical Summary Ms. Ciolino is a 67 y.o.female seen today for follow up of the following medical problems.    1. Palpitations - 08/2019 monitor showed SR with PVCs 08/2022 monitor: 14 day monitor, min HR 46, Max HR 167, Avg 70. 13 runs SVT longest 16 sec, otherwise rare ectopy.  - infrequent, about 1-2 times per week. Can last up to 15-20 minutes - 1 cup of coffee in AM, rare sodas, rare tea, no energy drinks, no EtOH  - no recent symptoms.      2. Orthostatic dizziness - occasional orthostatic dizziness, though fairly infrequent - reports adequate hydration - orthostatics neg in clinic today   -episode in March low bp's, 86/63 in triage - felt dizzy, tingling all over. Was at home, symptosm started around 10AM. No diarrhea, no vomiting, normal oral intake.  - was standing, started feeling dizzy. Went and sat down, symptoms did not resolve. Felt tingling in arms, no nausea.  - drinks 4 bottles of water a day.  - no recurrent dizziness since - she is diet contorlled DM2   - random AM cortisol was low at 1.9, referred to endocine - seen by endodrine, sent for stim test with f/u next week - some ongoing symptoms of dizziness.      3. Chest pain - episode in June - was at families house, sitting and talking at the table - lasted about 1 minute. Sharp pain/fullness, 8/10 in severity. No other associated symptoms. Not positional. No prior symptoms, none since. - walks 2 miles in the morning at times, last was a week ago. No specific exertional symptoms.           4. Autoimmune hepatitis - followed by rheum - Followed by [facility] hepatology     5.Heart murmur -noted by pcp 08/2022 visit - 10/2022 echo: LVEF 60-65%, no WMAs, grade I dd,    Past Medical History:  Diagnosis Date   Arthritis    hands   Atypical chest pain 04/14/2022   See cardiology OV note from 04/14/22 in Epic. Atypical chest pain thought to be related to a hiatal hernia.   Autoimmune hepatitis (HCC)     Follows w/ gastroenterology. LOV 12/14/21 with Annie Sable, AGNP (as of 04/18/22) in Epic.   Chest pain    Stress echo normal, October, 2012   COVID-19 03/2021   flu like symptoms, patient was prescribed an antiviral   Diabetes mellitus (HCC) 07/28/2016   Type 2, follows w/ PCP, Dr. Doylene Canard, LOV 01/28/22 (as of 0/15/24.) HgbA1c 6.8 on 01/28/22.   Dyslipidemia    Follows w/ PCP.   Ejection fraction    EF 55-60%, echo, October, 2012   GERD (gastroesophageal reflux disease)    Hiatal hernia    found during EGD   Idiopathic neuropathy    feet   NASH (nonalcoholic steatohepatitis)    chronic, follows w/ gastro, lov 12/14/21 in Epic   Wears glasses      Allergies  Allergen Reactions   Fenofibrate Micronized Other (See Comments)    Caused increased LFT's Caused increased LFT's   Glipizide     Hypoglycemia    Codeine Nausea And Vomiting   Farxiga [Dapagliflozin]     Bladder pains   Hydrocodone Nausea And Vomiting and Other (See Comments)    Makes body hot.    Janumet [Sitagliptin-Metformin Hcl]     Extremely bad gas    Tape     Can cause skin to tear  Current Outpatient Medications  Medication Sig Dispense Refill   atorvastatin (LIPITOR) 10 MG tablet Take 1 tablet (10 mg total) by mouth daily. 90 tablet 3   blood glucose meter kit and supplies KIT Dispense based on patient and insurance preference. Use up to four times daily as directed. (FOR ICD-9 250.00, 250.01). E11.9 1 each 0   budesonide (ENTOCORT EC) 3 MG 24 hr capsule Take 3 mg by mouth daily.     cetirizine (ZYRTEC) 10 MG tablet Take by mouth as needed.     Cholecalciferol (VITAMIN D-3 PO) Take 1 tablet by mouth daily.     ciclopirox (PENLAC) 8 % solution Apply topically at bedtime. Apply over nail and surrounding skin. Apply daily over previous coat. After seven (7) days, may remove with alcohol and continue cycle. 6.6 mL 1   CONTOUR NEXT TEST test strip CHECK BLOOD SUGAR UP TO 4 TIMES A DAY OR AS  DIRECTED 400 each 2   fluorouracil (EFUDEX) 5 % cream Apply topically 2 (two) times daily. X2-4 weeks 40 g 1   fluticasone (FLONASE) 50 MCG/ACT nasal spray Place 1 spray into both nostrils 2 (two) times daily as needed for allergies or rhinitis. 48 g 1   mycophenolate (MYFORTIC) 360 MG TBEC EC tablet Take 360 mg by mouth 2 (two) times daily.     pantoprazole (PROTONIX) 40 MG tablet Take 1 tablet (40 mg total) by mouth daily. 90 tablet 3   triamcinolone cream (KENALOG) 0.1 % Apply 1 Application topically 2 (two) times daily as needed (eczema flare for up to 7 days). 80 g 1   ofloxacin (FLOXIN) 0.3 % OTIC solution Place 5 drops into the left ear daily. (Patient not taking: Reported on 02/02/2023) 5 mL 0   No current facility-administered medications for this visit.     Past Surgical History:  Procedure Laterality Date   CHOLECYSTECTOMY  2008   COLONOSCOPY  06/10/2019   multiple colon polyps   CYSTOSCOPY N/A 04/20/2022   Procedure: CYSTOSCOPY;  Surgeon: Marguerita Beards, MD;  Location: [facility], [place];  Service: Gynecology;  Laterality: N/A;   ESOPHAGOGASTRODUODENOSCOPY     around 2018   EYE SURGERY Bilateral 07/15/2021   eye lids   LIVER BIOPSY  2005   LIVER BIOPSY  2019   steatohepatitis & moderate lymphoplasmacytic infiltrate & stage 2 -3 fibrosis   NASAL SINUS SURGERY     around 2001   [facility]   ROBOTIC ASSISTED LAPAROSCOPIC SACROCOLPOPEXY N/A 04/20/2022   Procedure: XI ROBOTIC ASSISTED LAPAROSCOPIC SACROCOLPOPEXY;  Surgeon: Marguerita Beards, MD;  Location: [facility];  Service: Gynecology;  Laterality: N/A;   VAGINAL HYSTERECTOMY  1999     Allergies  Allergen Reactions   Fenofibrate Micronized Other (See Comments)    Caused increased LFT's Caused increased LFT's   Glipizide     Hypoglycemia    Codeine Nausea And Vomiting   Farxiga [Dapagliflozin]     Bladder pains   Hydrocodone  Nausea And Vomiting and Other (See Comments)    Makes body hot.    Janumet [Sitagliptin-Metformin Hcl]     Extremely bad gas    Tape     Can cause skin to tear      Family History  Problem Relation Age of Onset   Thyroid disease Mother    Diabetes Mother    Cancer Mother    Cancer Father    Lupus Daughter  Arthritis/Rheumatoid Daughter    Diabetes type II Daughter      Social History Ms. Esselman reports that she quit smoking about 36 years ago. Her smoking use included cigarettes. She started smoking about 46 years ago. She has a 10 pack-year smoking history. She has never used smokeless tobacco. Ms. Rudiger reports that she does not currently use alcohol.   Review of Systems CONSTITUTIONAL: No weight loss, fever, chills, weakness or fatigue.  HEENT: Eyes: No visual loss, blurred vision, double vision or yellow sclerae.No hearing loss, sneezing, congestion, runny nose or sore throat.  SKIN: No rash or itching.  CARDIOVASCULAR: per hpi RESPIRATORY: No shortness of breath, cough or sputum.  GASTROINTESTINAL: No anorexia, nausea, vomiting or diarrhea. No abdominal pain or blood.  GENITOURINARY: No burning on urination, no polyuria NEUROLOGICAL: No headache, dizziness, syncope, paralysis, ataxia, numbness or tingling in the extremities. No change in bowel or bladder control.  MUSCULOSKELETAL: No muscle, back pain, joint pain or stiffness.  LYMPHATICS: No enlarged nodes. No history of splenectomy.  PSYCHIATRIC: No history of depression or anxiety.  ENDOCRINOLOGIC: No reports of sweating, cold or heat intolerance. No polyuria or polydipsia.  Marland Kitchen   Physical Examination Today's Vitals   02/02/23 1128 02/02/23 1206  BP: (!) 150/90 (!) 150/90  Pulse: 60   SpO2: 97%   Weight: 204 lb 6.4 oz (92.7 kg)   Height: 5\' 5"  (1.651 m)    Body mass index is 34.01 kg/m.  Gen: resting comfortably, no acute distress HEENT: no scleral icterus, pupils equal round and reactive, no palptable  cervical adenopathy,  CV: RRR, 2/6 systolic murmur rusb, no jvd Resp: Clear to auscultation bilaterally GI: abdomen is soft, non-tender, non-distended, normal bowel sounds, no hepatosplenomegaly MSK: extremities are warm, no edema.  Skin: warm, no rash Neuro:  no focal deficits Psych: appropriate affect   Diagnostic Studies   10/2022 echo 1. Left ventricular ejection fraction, by estimation, is 60 to 65%. The  left ventricle has normal function. The left ventricle has no regional  wall motion abnormalities. There is mild left ventricular hypertrophy.  Left ventricular diastolic parameters  are consistent with Grade I diastolic dysfunction (impaired relaxation).   2. Right ventricular systolic function is normal. The right ventricular  size is normal.   3. The mitral valve is normal in structure. No evidence of mitral valve  regurgitation. No evidence of mitral stenosis.   4. The aortic valve is tricuspid. Aortic valve regurgitation is not  visualized. No aortic stenosis is present.   5. Aortic dilatation noted. There is mild dilatation of the ascending  aorta, measuring 36 mm.   6. The inferior vena cava is normal in size with greater than 50%  respiratory variability, suggesting right atrial pressure of 3 mmHg.   Assessment and Plan   1. Palpitations -rare ectopy on recent monitor, rare short episodes SVT - mild symptoms, she has not been in favor of starting beta blocker up to this point -continue to monitor   2. Dizziness - isolated episode of low bp and dizziness unlcear etiology - AM cortisol was low, referred to endocrine. Just had stim test with f/u next week, f/u there results - mildly elevated bp today however with prior low bp hold on meds at this time.         Antoine Poche, M.D.,

## 2023-02-02 NOTE — Patient Instructions (Signed)
Medication Instructions:  Continue all current medications.   Labwork: none  Testing/Procedures: none  Follow-Up: 6 months   Any Other Special Instructions Will Be Listed Below (If Applicable).   If you need a refill on your cardiac medications before your next appointment, please call your pharmacy.  

## 2023-02-09 ENCOUNTER — Encounter: Payer: Self-pay | Admitting: "Endocrinology

## 2023-02-09 ENCOUNTER — Ambulatory Visit (INDEPENDENT_AMBULATORY_CARE_PROVIDER_SITE_OTHER): Payer: Medicare Other | Admitting: "Endocrinology

## 2023-02-09 VITALS — BP 148/72 | HR 72 | Ht 65.0 in | Wt 208.2 lb

## 2023-02-09 DIAGNOSIS — E119 Type 2 diabetes mellitus without complications: Secondary | ICD-10-CM

## 2023-02-09 DIAGNOSIS — E2740 Unspecified adrenocortical insufficiency: Secondary | ICD-10-CM

## 2023-02-09 MED ORDER — PREDNISONE 5 MG PO TABS: 5.0000 mg | ORAL_TABLET | Freq: Every day | ORAL | 1 refills | Status: DC

## 2023-02-09 NOTE — Patient Instructions (Signed)

## 2023-02-09 NOTE — Progress Notes (Signed)
02/09/2023, 7:02 PM   Endocrinology follow-up note  Subjective:    Patient ID: Taylor Leach, female    DOB: 1955/08/11, PCP Raliegh Ip, DO   Past Medical History:  Diagnosis Date   Arthritis    hands   Atypical chest pain 04/14/2022   See cardiology OV note from 04/14/22 in Epic. Atypical chest pain thought to be related to a hiatal hernia.   Autoimmune hepatitis (HCC)    Follows w/ gastroenterology. LOV 12/14/21 with Annie Sable, AGNP (as of 04/18/22) in Epic.   Chest pain    Stress echo normal, October, 2012   COVID-19 03/2021   flu like symptoms, patient was prescribed an antiviral   Diabetes mellitus (HCC) 07/28/2016   Type 2, follows w/ PCP, Dr. Doylene Canard, LOV 01/28/22 (as of 0/15/24.) HgbA1c 6.8 on 01/28/22.   Dyslipidemia    Follows w/ PCP.   Ejection fraction    EF 55-60%, echo, October, 2012   GERD (gastroesophageal reflux disease)    Hiatal hernia    found during EGD   Idiopathic neuropathy    feet   NASH (nonalcoholic steatohepatitis)    chronic, follows w/ gastro, lov 12/14/21 in Epic   Wears glasses    Past Surgical History:  Procedure Laterality Date   CHOLECYSTECTOMY  2008   COLONOSCOPY  06/10/2019   multiple colon polyps   CYSTOSCOPY N/A 04/20/2022   Procedure: CYSTOSCOPY;  Surgeon: Marguerita Beards, MD;  Location: [facility];  Service: Gynecology;  Laterality: N/A;   ESOPHAGOGASTRODUODENOSCOPY     around 2018   EYE SURGERY Bilateral 07/15/2021   eye lids   LIVER BIOPSY  2005   LIVER BIOPSY  2019   steatohepatitis & moderate lymphoplasmacytic infiltrate & stage 2 -3 fibrosis   NASAL SINUS SURGERY     around 2001   [facility]PA[facility]  [facility]   ROBOTIC ASSISTED LAPAROSCOPIC SACROCOLPOPEXY N/A 04/20/2022   Procedure: XI ROBOTIC ASSISTED LAPAROSCOPIC SACROCOLPOPEXY;  Surgeon: Marguerita Beards, MD;  Location: [facility];  Service: Gynecology;  Laterality: N/A;   VAGINAL HYSTERECTOMY  1999   Social History   Socioeconomic History   Marital status: Married    Spouse name: Not on file   Number of children: Not on file   Years of education: Not on file   Highest education level: Associate degree: occupational, Scientist, product/process development, or vocational program  Occupational History   Not on file  Tobacco Use   Smoking status: Former    Current packs/day: 0.00    Average packs/day: 1 pack/day for 10.0 years (10.0 ttl pk-yrs)    Types: Cigarettes    Start date: 31    Quit date: 63    Years since quitting: 36.8   Smokeless tobacco: Never  Vaping Use   Vaping status: Never Used  Substance and Sexual Activity   Alcohol use: Not Currently   Drug use: No   Sexual activity: Yes    Birth control/protection: Surgical  Other Topics Concern   Not on file  Social History Narrative   Patient resides with her spouse.  She has a son and a daughter, both of whom live  within 1 hour of her home.  She also has pets.  No fall risks however.  They are well-behaved.  She retired in June 2023.   Social Determinants of Health   Financial Resource Strain: Low Risk  (11/08/2022)   Overall Financial Resource Strain (CARDIA)    Difficulty of Paying Living Expenses: Not hard at all  Food Insecurity: No Food Insecurity (11/08/2022)   Hunger Vital Sign    Worried About Running Out of Food in the Last Year: Never true    Ran Out of Food in the Last Year: Never true  Transportation Needs: No Transportation Needs (11/08/2022)   PRAPARE - Administrator, Civil Service (Medical): No    Lack of Transportation (Non-Medical): No  Physical Activity: Insufficiently Active (11/08/2022)   Exercise Vital Sign    Days of Exercise per Week: 3 days    Minutes of Exercise per Session: 20 min  Stress: No Stress Concern Present (11/08/2022)   [employer] of Occupational Health - Occupational Stress Questionnaire     Feeling of Stress : Not at all  Social Connections: Socially Integrated (11/08/2022)   Social Connection and Isolation Panel [NHANES]    Frequency of Communication with Friends and Family: More than three times a week    Frequency of Social Gatherings with Friends and Family: More than three times a week    Attends Religious Services: More than 4 times per year    Active Member of [employer] or Organizations: Yes    Attends Engineer, structural: More than 4 times per year    Marital Status: Married   Family History  Problem Relation Age of Onset   Thyroid disease Mother    Diabetes Mother    Cancer Mother    Cancer Father    Lupus Daughter    Arthritis/Rheumatoid Daughter    Diabetes type II Daughter    Outpatient Encounter Medications as of 02/09/2023  Medication Sig   predniSONE (DELTASONE) 5 MG tablet Take 1 tablet (5 mg total) by mouth daily with breakfast.   atorvastatin (LIPITOR) 10 MG tablet Take 1 tablet (10 mg total) by mouth daily.   blood glucose meter kit and supplies KIT Dispense based on patient and insurance preference. Use up to four times daily as directed. (FOR ICD-9 250.00, 250.01). E11.9   budesonide (ENTOCORT EC) 3 MG 24 hr capsule Take 3 mg by mouth daily.   cetirizine (ZYRTEC) 10 MG tablet Take by mouth as needed.   Cholecalciferol (VITAMIN D-3 PO) Take 1 tablet by mouth daily.   ciclopirox (PENLAC) 8 % solution Apply topically at bedtime. Apply over nail and surrounding skin. Apply daily over previous coat. After seven (7) days, may remove with alcohol and continue cycle.   CONTOUR NEXT TEST test strip CHECK BLOOD SUGAR UP TO 4 TIMES A DAY OR AS DIRECTED   fluorouracil (EFUDEX) 5 % cream Apply topically 2 (two) times daily. X2-4 weeks   fluticasone (FLONASE) 50 MCG/ACT nasal spray Place 1 spray into both nostrils 2 (two) times daily as needed for allergies or rhinitis.   mycophenolate (MYFORTIC) 360 MG TBEC EC tablet Take 360 mg by mouth 2 (two) times daily.    ofloxacin (FLOXIN) 0.3 % OTIC solution Place 5 drops into the left ear daily. (Patient not taking: Reported on 02/02/2023)   pantoprazole (PROTONIX) 40 MG tablet Take 1 tablet (40 mg total) by mouth daily.   triamcinolone cream (KENALOG) 0.1 % Apply 1 Application topically 2 (two)  times daily as needed (eczema flare for up to 7 days).   No facility-administered encounter medications on file as of 02/09/2023.   ALLERGIES: Allergies  Allergen Reactions   Fenofibrate Micronized Other (See Comments)    Caused increased LFT's Caused increased LFT's   Glipizide     Hypoglycemia    Codeine Nausea And Vomiting   Farxiga [Dapagliflozin]     Bladder pains   Hydrocodone Nausea And Vomiting and Other (See Comments)    Makes body hot.    Janumet [Sitagliptin-Metformin Hcl]     Extremely bad gas    Tape     Can cause skin to tear    VACCINATION STATUS: Immunization History  Administered Date(s) Administered   Hep A / Hep B 03/24/2008, 04/23/2008, 09/08/2008   Influenza, Seasonal, Injecte, Preservative Fre 01/10/2008   Influenza,inj,Quad PF,6+ Mos 05/18/2016   Influenza-Unspecified 02/02/2018   PFIZER(Purple Top)SARS-COV-2 Vaccination 11/08/2019, 11/29/2019   PNEUMOCOCCAL CONJUGATE-20 10/29/2021   Pneumococcal Polysaccharide-23 11/30/2017   Zoster Recombinant(Shingrix) 12/12/2018, 06/12/2019    HPI Taylor Leach is 67 y.o. female who presents today with a medical history as above. she is being seen in follow-up after she was seen in consultation for hypercortisolemia requested by Raliegh Ip, DO.  She is accompanied today by her daughter to clinic.  History is obtained from the family as well as chart review.  She gives history of exposure to high-dose steroids related to her autoimmune hepatitis in the past.  Although her exposure ranges between 6 to 7 years Mizera or prednisone as high as 60 mg daily at times. She is currently on budesonide 3 mg p.o. daily. In the middle of  July, she developed dizziness, lightheadedness, which led to ER visit.  Her blood work showed suboptimal a.m. cortisol at 1.9.  She was never diagnosed with adrenal insufficiency.  She is not initiated on any steroids.  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  See below. She denies family history of adrenal dysfunction.  Her other medical problems include type 2 diabetes and hyperlipidemia on treatment.  She does not have hypertension.  Her current medications include Myfortic acid, Protonix, budesonide, vitamin D.  Her most recent A1c measurements range between 6-7%.  She is not on medications for diabetes at this time.  Review of Systems  Constitutional: +mildly fluctuating body weight ,  + fatigue, no subjective hyperthermia, no subjective hypothermia Eyes: no blurry vision, no xerophthalmia   Objective:       02/09/2023    3:45 PM 02/02/2023   12:06 PM 02/02/2023   11:28 AM  Vitals with BMI  Height 5\' 5"   5\' 5"   Weight 208 lbs 3 oz  204 lbs 6 oz  BMI 34.65  34.01  Systolic 148 150 841  Diastolic 72 90 90  Pulse 72  60    BP (!) 148/72   Pulse 72   Ht 5\' 5"  (1.651 m)   Wt 208 lb 3.2 oz (94.4 kg)   BMI 34.65 kg/m   Wt Readings from Last 3 Encounters:  02/09/23 208 lb 3.2 oz (94.4 kg)  02/02/23 204 lb 6.4 oz (92.7 kg)  01/17/23 202 lb 12.8 oz (92 kg)    Physical Exam  Constitutional:  Body mass index is 34.65 kg/m.,  not in acute distress, normal state of mind Eyes: PERRLA, EOMI, no exophthalmos ENT: moist mucous membranes, no gross thyromegaly, no gross cervical lymphadenopathy   CMP (  most recent) CMP     Component Value Date/Time   NA 135 06/06/2022 1848   NA 138 05/23/2022 1241   K 3.3 (L) 06/06/2022 1848   CL 101 06/06/2022 1848   CO2 27 06/06/2022 1848   GLUCOSE 120 (H) 06/06/2022 1848   BUN 10 06/06/2022 1848   BUN 11 05/23/2022 1241   CREATININE 0.96 06/06/2022 1848   CALCIUM 9.1  06/06/2022 1848   PROT 7.3 11/09/2022 0817   ALBUMIN 4.1 11/09/2022 0817   AST 27 11/09/2022 0817   ALT 30 11/09/2022 0817   ALKPHOS 83 11/09/2022 0817   BILITOT 0.6 11/09/2022 0817   EGFR 55 (L) 05/23/2022 1241   GFRNONAA >60 06/06/2022 1848     Diabetic Labs (most recent): Lab Results  Component Value Date   HGBA1C 7.0 (H) 11/09/2022   HGBA1C 6.7 (H) 08/05/2022   HGBA1C 6.9 (H) 05/03/2022     Lipid Panel ( most recent) Lipid Panel     Component Value Date/Time   CHOL 148 08/05/2022 0934   TRIG 126 08/05/2022 0934   HDL 52 08/05/2022 0934   CHOLHDL 2.8 08/05/2022 0934   CHOLHDL 4.1 10/23/2010 1748   VLDL 40 10/23/2010 1748   LDLCALC 74 08/05/2022 0934   LABVLDL 22 08/05/2022 0934      Lab Results  Component Value Date   TSH 2.440 08/05/2022   TSH 1.747 06/06/2022   TSH 1.990 07/16/2019   TSH 4.050 06/07/2019   TSH 2.640 08/10/2018   FREET4 1.33 08/05/2022      Assessment & Plan:   Hypocortisolism  2.  Type 2 diabetes  - Taylor Leach  is being seen at a kind request of Delynn Flavin M, DO. - I have reviewed her new available  records and clinically evaluated the patient. 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  I discussed and prescribed prednisone 5 mg p.o. daily at breakfast.  I advised her to wear a medic alert.     She has several years of exposure to high-dose steroids orally to treat autoimmune hepatitis.  This seems to be her highest risk for adrenal insufficiency.  -Regarding her diagnosis of type 2 diabetes with A1c ranging between 6-7%.  She is not initiated on any treatment.  However I discussed dietary approach for her to avoid processed carbs and processed meat for now.  She will have A1c measurement subsequently, and will be considered for intervention if  necessary.  - she is advised to maintain close follow up with Raliegh Ip, DO for primary care needs.  I spent  22  minutes in the care of the patient today including review of labs from Thyroid Function, CMP, and other relevant labs ; imaging/biopsy records (current and previous including abstractions from other facilities); face-to-face time discussing  her lab results and symptoms, medications doses, her options of short and long term treatment based on the latest standards of care / guidelines;   and documenting the encounter.  Taylor Leach  participated in the discussions, expressed understanding, and voiced agreement with the above plans.  All questions were answered to her satisfaction. she is encouraged to contact clinic should she have any questions or concerns prior to her return visit.   Follow up plan: Return in about 4 months (around 06/09/2023) for F/U with Pre-visit Labs.  Marquis Lunch, MD [facility] [address] [place], [place] [address] Phone: [phone]  Fax: [phone]     02/09/2023, 7:02 PM  This note was partially dictated with voice recognition software. Similar sounding words can be transcribed inadequately or may not  be corrected upon review.

## 2023-02-20 ENCOUNTER — Encounter: Payer: Self-pay | Admitting: Family Medicine

## 2023-02-22 ENCOUNTER — Other Ambulatory Visit: Payer: Medicare Other

## 2023-02-22 DIAGNOSIS — E1169 Type 2 diabetes mellitus with other specified complication: Secondary | ICD-10-CM

## 2023-02-22 DIAGNOSIS — K754 Autoimmune hepatitis: Secondary | ICD-10-CM

## 2023-02-22 LAB — BAYER DCA HB A1C WAIVED: HB A1C (BAYER DCA - WAIVED): 7.9 % — ABNORMAL HIGH (ref 4.8–5.6)

## 2023-02-23 LAB — CBC WITH DIFFERENTIAL/PLATELET
Basophils Absolute: 0 10*3/uL (ref 0.0–0.2)
Basos: 1 %
EOS (ABSOLUTE): 0.1 10*3/uL (ref 0.0–0.4)
Eos: 1 %
Hematocrit: 43 % (ref 34.0–46.6)
Hemoglobin: 14.6 g/dL (ref 11.1–15.9)
Immature Grans (Abs): 0 10*3/uL (ref 0.0–0.1)
Immature Granulocytes: 1 %
Lymphocytes Absolute: 1.1 10*3/uL (ref 0.7–3.1)
Lymphs: 18 %
MCH: 31.5 pg (ref 26.6–33.0)
MCHC: 34 g/dL (ref 31.5–35.7)
MCV: 93 fL (ref 79–97)
Monocytes Absolute: 0.7 10*3/uL (ref 0.1–0.9)
Monocytes: 11 %
Neutrophils Absolute: 4 10*3/uL (ref 1.4–7.0)
Neutrophils: 68 %
Platelets: 171 10*3/uL (ref 150–450)
RBC: 4.63 x10E6/uL (ref 3.77–5.28)
RDW: 12 % (ref 11.7–15.4)
WBC: 5.9 10*3/uL (ref 3.4–10.8)

## 2023-02-23 LAB — HEPATIC FUNCTION PANEL
ALT: 33 IU/L — ABNORMAL HIGH (ref 0–32)
AST: 26 [IU]/L (ref 0–40)
Albumin: 4.1 g/dL (ref 3.9–4.9)
Alkaline Phosphatase: 70 IU/L (ref 44–121)
Bilirubin Total: 0.7 mg/dL (ref 0.0–1.2)
Bilirubin, Direct: 0.24 mg/dL (ref 0.00–0.40)
Total Protein: 7.4 g/dL (ref 6.0–8.5)

## 2023-02-23 LAB — IGG: IgG (Immunoglobin G), Serum: 1761 mg/dL — ABNORMAL HIGH (ref 586–1602)

## 2023-03-01 ENCOUNTER — Telehealth: Payer: Self-pay | Admitting: Family Medicine

## 2023-03-01 ENCOUNTER — Encounter: Payer: Self-pay | Admitting: Family Medicine

## 2023-03-01 ENCOUNTER — Other Ambulatory Visit: Payer: Self-pay

## 2023-03-01 ENCOUNTER — Ambulatory Visit: Payer: Medicare Other | Admitting: Family Medicine

## 2023-03-01 VITALS — BP 140/68 | HR 61 | Temp 98.2°F | Ht 65.0 in | Wt 204.0 lb

## 2023-03-01 DIAGNOSIS — E1165 Type 2 diabetes mellitus with hyperglycemia: Secondary | ICD-10-CM

## 2023-03-01 DIAGNOSIS — K754 Autoimmune hepatitis: Secondary | ICD-10-CM

## 2023-03-01 DIAGNOSIS — E1159 Type 2 diabetes mellitus with other circulatory complications: Secondary | ICD-10-CM | POA: Diagnosis not present

## 2023-03-01 DIAGNOSIS — E1169 Type 2 diabetes mellitus with other specified complication: Secondary | ICD-10-CM

## 2023-03-01 DIAGNOSIS — E785 Hyperlipidemia, unspecified: Secondary | ICD-10-CM

## 2023-03-01 DIAGNOSIS — Z7984 Long term (current) use of oral hypoglycemic drugs: Secondary | ICD-10-CM

## 2023-03-01 DIAGNOSIS — I152 Hypertension secondary to endocrine disorders: Secondary | ICD-10-CM

## 2023-03-01 DIAGNOSIS — E2740 Unspecified adrenocortical insufficiency: Secondary | ICD-10-CM

## 2023-03-01 MED ORDER — SITAGLIPTIN PHOSPHATE 100 MG PO TABS: 100.0000 mg | ORAL_TABLET | Freq: Every day | ORAL | 3 refills | Status: DC

## 2023-03-01 NOTE — Progress Notes (Signed)
Subjective: CC:DM PCP: Raliegh Ip, DO [id number] CICLALI DECOUX is a 67 y.o. female presenting to clinic today for:  1. Type 2 Diabetes with hypertension, hyperlipidemia and autoimmune hepatitis:  Patient has been off the GLP ever since she started having severe abdominal issues.  She has been started on prednisone 5 mg daily by endocrinology for adrenal insufficiency.  She does report improvement in energy with this.  She also has noticed some surges in sugar where they will go as high as 200s after meals.  She is not carb restricting very much but she is trying to watch what she eats.  She does not want a cane weight.  Sadly she has many intolerances to multiple medications including glipizide, Farxiga, metformin/Janumet and of course Ozempic most recently.  She has never been on plain Januvia before.  She is point-of-care testing with a kit at home.  Diabetes Health Maintenance Due  Topic Date Due   HEMOGLOBIN A1C  08/22/2023   OPHTHALMOLOGY EXAM  10/17/2023   FOOT EXAM  11/09/2023    Last A1c:  Lab Results  Component Value Date   HGBA1C 7.9 (H) 02/22/2023    ROS: No chest pain, shortness of breath reported.  No blurred vision.   ROS: Per HPI  Allergies  Allergen Reactions   Fenofibrate Micronized Other (See Comments)    Caused increased LFT's Caused increased LFT's   Glipizide     Hypoglycemia    Codeine Nausea And Vomiting   Farxiga [Dapagliflozin]     Bladder pains   Hydrocodone Nausea And Vomiting and Other (See Comments)    Makes body hot.    Janumet [Sitagliptin-Metformin Hcl]     Extremely bad gas    Tape     Can cause skin to tear   Past Medical History:  Diagnosis Date   Arthritis    hands   Atypical chest pain 04/14/2022   See cardiology OV note from 04/14/22 in Epic. Atypical chest pain thought to be related to a hiatal hernia.   Autoimmune hepatitis (HCC)    Follows w/ gastroenterology. LOV 12/14/21 with Annie Sable, AGNP (as of 04/18/22) in  Epic.   Chest pain    Stress echo normal, October, 2012   COVID-19 03/2021   flu like symptoms, patient was prescribed an antiviral   Diabetes mellitus (HCC) 07/28/2016   Type 2, follows w/ PCP, Dr. Doylene Canard, LOV 01/28/22 (as of 0/15/24.) HgbA1c 6.8 on 01/28/22.   Dyslipidemia    Follows w/ PCP.   Ejection fraction    EF 55-60%, echo, October, 2012   GERD (gastroesophageal reflux disease)    Hiatal hernia    found during EGD   Idiopathic neuropathy    feet   NASH (nonalcoholic steatohepatitis)    chronic, follows w/ gastro, lov 12/14/21 in Epic   Wears glasses     Current Outpatient Medications:    atorvastatin (LIPITOR) 10 MG tablet, Take 1 tablet (10 mg total) by mouth daily., Disp: 90 tablet, Rfl: 3   blood glucose meter kit and supplies KIT, Dispense based on patient and insurance preference. Use up to four times daily as directed. (FOR ICD-9 250.00, 250.01). E11.9, Disp: 1 each, Rfl: 0   budesonide (ENTOCORT EC) 3 MG 24 hr capsule, Take 3 mg by mouth daily., Disp: , Rfl:    cetirizine (ZYRTEC) 10 MG tablet, Take by mouth as needed., Disp: , Rfl:    Cholecalciferol (VITAMIN D-3 PO), Take 1 tablet by mouth daily., Disp: ,  Rfl:    ciclopirox (PENLAC) 8 % solution, Apply topically at bedtime. Apply over nail and surrounding skin. Apply daily over previous coat. After seven (7) days, may remove with alcohol and continue cycle., Disp: 6.6 mL, Rfl: 1   CONTOUR NEXT TEST test strip, CHECK BLOOD SUGAR UP TO 4 TIMES A DAY OR AS DIRECTED, Disp: 400 each, Rfl: 2   fluorouracil (EFUDEX) 5 % cream, Apply topically 2 (two) times daily. X2-4 weeks, Disp: 40 g, Rfl: 1   fluticasone (FLONASE) 50 MCG/ACT nasal spray, Place 1 spray into both nostrils 2 (two) times daily as needed for allergies or rhinitis., Disp: 48 g, Rfl: 1   mycophenolate (MYFORTIC) 360 MG TBEC EC tablet, Take 360 mg by mouth 2 (two) times daily., Disp: , Rfl:    ofloxacin (FLOXIN) 0.3 % OTIC solution, Place 5 drops into  the left ear daily., Disp: 5 mL, Rfl: 0   pantoprazole (PROTONIX) 40 MG tablet, Take 1 tablet (40 mg total) by mouth daily., Disp: 90 tablet, Rfl: 3   predniSONE (DELTASONE) 5 MG tablet, Take 1 tablet (5 mg total) by mouth daily with breakfast., Disp: 90 tablet, Rfl: 1   sitaGLIPtin (JANUVIA) 100 MG tablet, Take 1 tablet (100 mg total) by mouth daily., Disp: 90 tablet, Rfl: 3   triamcinolone cream (KENALOG) 0.1 %, Apply 1 Application topically 2 (two) times daily as needed (eczema flare for up to 7 days)., Disp: 80 g, Rfl: 1 Social History   Socioeconomic History   Marital status: Married    Spouse name: Not on file   Number of children: Not on file   Years of education: Not on file   Highest education level: Associate degree: occupational, Scientist, product/process development, or vocational program  Occupational History   Not on file  Tobacco Use   Smoking status: Former    Current packs/day: 0.00    Average packs/day: 1 pack/day for 10.0 years (10.0 ttl pk-yrs)    Types: Cigarettes    Start date: 38    Quit date: 16    Years since quitting: 36.9   Smokeless tobacco: Never  Vaping Use   Vaping status: Never Used  Substance and Sexual Activity   Alcohol use: Not Currently   Drug use: No   Sexual activity: Yes    Birth control/protection: Surgical  Other Topics Concern   Not on file  Social History Narrative   Patient resides with her spouse.  She has a son and a daughter, both of whom live within 1 hour of her home.  She also has pets.  No fall risks however.  They are well-behaved.  She retired in June 2023.   Social Determinants of Health   Financial Resource Strain: Low Risk  (02/28/2023)   Overall Financial Resource Strain (CARDIA)    Difficulty of Paying Living Expenses: Not hard at all  Food Insecurity: No Food Insecurity (02/28/2023)   Hunger Vital Sign    Worried About Running Out of Food in the Last Year: Never true    Ran Out of Food in the Last Year: Never true  Transportation Needs:  No Transportation Needs (02/28/2023)   PRAPARE - Administrator, Civil Service (Medical): No    Lack of Transportation (Non-Medical): No  Physical Activity: Insufficiently Active (02/28/2023)   Exercise Vital Sign    Days of Exercise per Week: 3 days    Minutes of Exercise per Session: 20 min  Stress: No Stress Concern Present (02/28/2023)   [employer]  [employer] - Occupational Stress Questionnaire    Feeling of Stress : Not at all  Social Connections: Socially Integrated (02/28/2023)   Social Connection and Isolation Panel [NHANES]    Frequency of Communication with Friends and Family: More than three times a week    Frequency of Social Gatherings with Friends and Family: More than three times a week    Attends Religious Services: More than 4 times per year    Active Member of [employer] or Organizations: Yes    Attends Engineer, structural: More than 4 times per year    Marital Status: Married  Catering manager Violence: Not At Risk (08/23/2021)   [facility]   Humiliation, Afraid, Rape, and Kick questionnaire    Fear of Current or Ex-Partner: No    Emotionally Abused: No    Physically Abused: No    Sexually Abused: No   Family History  Problem Relation Age of Onset   Thyroid disease Mother    Diabetes Mother    Cancer Mother    Cancer Father    Lupus Daughter    Arthritis/Rheumatoid Daughter    Diabetes type II Daughter     Objective: Office vital signs reviewed. BP (!) 140/68   Pulse 61   Temp 98.2 F (36.8 C)   Ht 5\' 5"  (1.651 m)   Wt 204 lb (92.5 kg)   SpO2 98%   BMI 33.95 kg/m   Physical Examination:  General: Awake, alert, morbidly obese, No acute distress HEENT: Sclera white.  No jaundice Cardio: regular rate and rhythm, S1S2 heard, no murmurs appreciated Pulm: clear to auscultation bilaterally, no wheezes, rhonchi or rales; normal work of breathing on room air  Assessment/ Plan: 67  y.o. female   Uncontrolled type 2 diabetes mellitus with hyperglycemia (HCC) - Plan: sitaGLIPtin (JANUVIA) 100 MG tablet  Hypertension associated with diabetes (HCC)  Hyperlipidemia associated with type 2 diabetes mellitus (HCC)  Autoimmune hepatitis (HCC)  Adrenal insufficiency (HCC)   Sugar now uncontrolled with A1c rising to 7.9.  I am going to add plain Januvia to her regimen.  If cannot tolerate really we will have to switch over to an insulin.  Would certainly consider long-acting before adding mealtime.  Would also plan for freestyle libre or other CGM if adding insulin.  She will reach out to me prior to her next visit in 3 months if she cannot tolerate Januvia and or if sugars continue to rise.  We discussed carb restricting and eliminating unnecessary sugars.  Would certainly incorporate some physical activity into the mix to reduce blood sugar naturally as well  Blood pressure is currently diet controlled but borderline.  Will need to watch this closely especially if she starts gaining weight.  She will continue statin at current dosing  Continue to follow-up with hepatology as scheduled.  Labs have already been sent to them  Continue prednisone as directed by endocrinology  Raliegh Ip, DO [facility] [phone]

## 2023-03-01 NOTE — Telephone Encounter (Signed)
Labs added.

## 2023-03-01 NOTE — Telephone Encounter (Signed)
Pt scheduled for 05/30/2023 lab apt before her 06/02/2023 apt with DRG. Please add orders.

## 2023-05-04 ENCOUNTER — Ambulatory Visit: Payer: Self-pay | Admitting: Family Medicine

## 2023-05-04 NOTE — Telephone Encounter (Signed)
Copied from CRM [id number]. Topic: Clinical - Red Word Triage >> May 04, 2023  1:38 PM Dennison Nancy wrote: Red Word that prompted transfer to Nurse Triage: Patient fell  this morning  on right hand swelling and real sore   Chief Complaint: Fall Symptoms: Right hand pain Frequency: Single fall, constant pain Pertinent Negatives: Patient denies dizziness, lightheadedness, other injury Disposition: [] ED /[x] Urgent Care (no appt availability in office) / [] Appointment(In office/virtual)/ []  [facility]/ [] Home Care/ [] Refused Recommended Disposition /[] [place] [facility]/ []  Follow-up with PCP Additional Notes: Patient reports that she fell today at approximately 11 am when she tripped over the concrete bar at the end of her parking space. She states that when she fell she caught herself on her right and and knees. She states that her right hand is slightly swollen and that she is unable to close her fist or straighten her hand fully. She denies any pain to her knees. Patient advised to go to urgent care for evaluation and treatment due to no appointment availability today. Patient verbalized understanding and agreement of this plan.    Reason for Disposition  [1] Large swelling or bruise (> 2 inches or 5 cm) AND [2] can't use injured hand normally (e.g., make a fist, open fully, hold a glass of water)  Answer Assessment - Initial Assessment Questions 1. MECHANISM: "How did the fall happen?"     Tripped over cement bar in front of parking space  2. DOMESTIC VIOLENCE AND ELDER ABUSE SCREENING: "Did you fall because someone pushed you or tried to hurt you?" If Yes, ask: "Are you safe now?"     No 3. ONSET: "When did the fall happen?" (e.g., minutes, hours, or days ago)     Today, 11 am  4. LOCATION: "What part of the body hit the ground?" (e.g., back, buttocks, head, hips, knees, hands, head, stomach)     Right hand and knee 5. INJURY: "Did you hurt (injure) yourself when you fell?" If  Yes, ask: "What did you injure? Tell me more about this?" (e.g., body area; type of injury; pain severity)"     Right hand, side going to middle 6. PAIN: "Is there any pain?" If Yes, ask: "How bad is the pain?" (e.g., Scale 1-10; or mild,  moderate, severe)   - NONE (0): No pain   - MILD (1-3): Doesn't interfere with normal activities    - MODERATE (4-7): Interferes with normal activities or awakens from sleep    - SEVERE (8-10): Excruciating pain, unable to do any normal activities      6/10 7. SIZE: For cuts, bruises, or swelling, ask: "How large is it?" (e.g., inches or centimeters)      Some swelling  8. PREGNANCY: "Is there any chance you are pregnant?" "When was your last menstrual period?"     No 9. OTHER SYMPTOMS: "Do you have any other symptoms?" (e.g., dizziness, fever, weakness; new onset or worsening).      No 10. CAUSE: "What do you think caused the fall (or falling)?" (e.g., tripped, dizzy spell)       Tripped  Answer Assessment - Initial Assessment Questions 1. MECHANISM: "How did the injury happen?"     Fall 2. ONSET: "When did the injury happen?" (Minutes or hours ago)      Today at 11 am 3. APPEARANCE of INJURY: "What does the injury look like?"      Slightly swollen 4. SEVERITY: "Can you use the hand normally?" "Can  you bend your fingers into a ball and then fully open them?"     Unable to fully close or straighten hand 5. SIZE: For cuts, bruises, or swelling, ask: "How large is it?" (e.g., inches or centimeters;  entire hand or wrist)      "Slightly swollen" 6. PAIN: "Is there pain?" If Yes, ask: "How bad is the pain?"  (Scale 1-10; or mild, moderate, severe)     6/10 7. TETANUS: For any breaks in the skin, ask: "When was the last tetanus booster?"     N/A 8. OTHER SYMPTOMS: "Do you have any other symptoms?"      No 9. PREGNANCY: "Is there any chance you are pregnant?" "When was your last menstrual period?"     No  Protocols used: Falls and Falling-A-AH, Hand  and Wrist Injury-A-AH

## 2023-05-05 ENCOUNTER — Ambulatory Visit (INDEPENDENT_AMBULATORY_CARE_PROVIDER_SITE_OTHER): Payer: Medicare Other

## 2023-05-05 VITALS — Ht 65.0 in | Wt 204.0 lb

## 2023-05-05 DIAGNOSIS — Z0000 Encounter for general adult medical examination without abnormal findings: Secondary | ICD-10-CM | POA: Diagnosis not present

## 2023-05-05 NOTE — Patient Instructions (Signed)
Taylor Leach , Thank you for taking time to come for your Medicare Wellness Visit. I appreciate your ongoing commitment to your health goals. Please review the following plan we discussed and let me know if I can assist you in the future.   Referrals/Orders/Follow-Ups/Clinician Recommendations: Aim for 30 minutes of exercise or brisk walking, 6-8 glasses of water, and 5 servings of fruits and vegetables each day.  This is a list of the screening recommended for you and due dates:  Health Maintenance  Topic Date Due   COVID-19 Vaccine (3 - Pfizer risk series) 12/27/2019   Yearly kidney health urinalysis for diabetes  05/04/2023   Flu Shot  07/03/2023*   DTaP/Tdap/Td vaccine (1 - Tdap) 02/29/2024*   Hepatitis C Screening  02/29/2024*   Yearly kidney function blood test for diabetes  06/06/2023   Hemoglobin A1C  08/22/2023   Mammogram  08/31/2023   Eye exam for diabetics  10/17/2023   Complete foot exam   11/09/2023   Medicare Annual Wellness Visit  05/04/2024   Colon Cancer Screening  04/03/2033   Pneumonia Vaccine  Completed   DEXA scan (bone density measurement)  Completed   Zoster (Shingles) Vaccine  Completed   HPV Vaccine  Aged Out  *Topic was postponed. The date shown is not the original due date.    Advanced directives: (ACP Link)Information on Advanced Care Planning can be found at [facility] Advance Health Care Directives Advance Health Care Directives ([email or web address]   Next Medicare Annual Wellness Visit scheduled for next year:

## 2023-05-05 NOTE — Progress Notes (Signed)
Subjective:   Taylor Leach is a 68 y.o. female who presents for Medicare Annual (Subsequent) preventive examination.  Visit Complete: Virtual I connected with  Taylor Leach on 05/05/23 by a audio enabled telemedicine application and verified that I am speaking with the correct person using two identifiers.  Patient Location: Home  Provider Location: Home Office  This patient declined Interactive audio and video telecommunications. Therefore the visit was completed with audio only.  I discussed the limitations of evaluation and management by telemedicine. The patient expressed understanding and agreed to proceed.  Vital Signs: Because this visit was a virtual/telehealth visit, some criteria may be missing or patient reported. Any vitals not documented were not able to be obtained and vitals that have been documented are patient reported.  Cardiac Risk Factors include: diabetes mellitus;advanced age (>96men, >58 women);dyslipidemia;hypertension     Objective:    Today's Vitals   05/05/23 1101  Weight: 204 lb (92.5 kg)  Height: 5\' 5"  (1.651 m)   Body mass index is 33.95 kg/m.     05/05/2023   11:05 AM 05/03/2022    2:40 PM 04/20/2022    9:58 AM 10/23/2010    9:20 PM 10/23/2010    7:57 PM  Advanced Directives  Does Patient Have a Medical Advance Directive? No No No Patient does not have advance directive Patient does not have advance directive  Would patient like information on creating a medical advance directive? Yes (MAU/Ambulatory/Procedural Areas - Information given) Yes (MAU/Ambulatory/Procedural Areas - Information given) No - Patient declined    Pre-existing out of facility DNR order (yellow form or pink MOST form)     No    Current Medications (verified) Outpatient Encounter Medications as of 05/05/2023  Medication Sig   atorvastatin (LIPITOR) 10 MG tablet Take 1 tablet (10 mg total) by mouth daily.   blood glucose meter kit and supplies KIT Dispense based on patient and  insurance preference. Use up to four times daily as directed. (FOR ICD-9 250.00, 250.01). E11.9   budesonide (ENTOCORT EC) 3 MG 24 hr capsule Take 3 mg by mouth daily.   cetirizine (ZYRTEC) 10 MG tablet Take by mouth as needed.   Cholecalciferol (VITAMIN D-3 PO) Take 1 tablet by mouth daily.   ciclopirox (PENLAC) 8 % solution Apply topically at bedtime. Apply over nail and surrounding skin. Apply daily over previous coat. After seven (7) days, may remove with alcohol and continue cycle.   CONTOUR NEXT TEST test strip CHECK BLOOD SUGAR UP TO 4 TIMES A DAY OR AS DIRECTED   fluorouracil (EFUDEX) 5 % cream Apply topically 2 (two) times daily. X2-4 weeks   fluticasone (FLONASE) 50 MCG/ACT nasal spray Place 1 spray into both nostrils 2 (two) times daily as needed for allergies or rhinitis.   mycophenolate (MYFORTIC) 360 MG TBEC EC tablet Take 360 mg by mouth 2 (two) times daily.   ofloxacin (FLOXIN) 0.3 % OTIC solution Place 5 drops into the left ear daily.   pantoprazole (PROTONIX) 40 MG tablet Take 1 tablet (40 mg total) by mouth daily.   predniSONE (DELTASONE) 5 MG tablet Take 1 tablet (5 mg total) by mouth daily with breakfast.   sitaGLIPtin (JANUVIA) 100 MG tablet Take 1 tablet (100 mg total) by mouth daily.   triamcinolone cream (KENALOG) 0.1 % Apply 1 Application topically 2 (two) times daily as needed (eczema flare for up to 7 days).   No facility-administered encounter medications on file as of 05/05/2023.    Allergies (  verified) Fenofibrate micronized, Glipizide, Codeine, Farxiga [dapagliflozin], Hydrocodone, Janumet [sitagliptin-metformin hcl], and Tape   History: Past Medical History:  Diagnosis Date   Arthritis    hands   Atypical chest pain 04/14/2022   See cardiology OV note from 04/14/22 in Epic. Atypical chest pain thought to be related to a hiatal hernia.   Autoimmune hepatitis (HCC)    Follows w/ gastroenterology. LOV 12/14/21 with Annie Sable, AGNP (as of 04/18/22) in  Epic.   Chest pain    Stress echo normal, October, 2012   COVID-19 03/2021   flu like symptoms, patient was prescribed an antiviral   Diabetes mellitus (HCC) 07/28/2016   Type 2, follows w/ PCP, Dr. Doylene Canard, LOV 01/28/22 (as of 0/15/24.) HgbA1c 6.8 on 01/28/22.   Dyslipidemia    Follows w/ PCP.   Ejection fraction    EF 55-60%, echo, October, 2012   GERD (gastroesophageal reflux disease)    Hiatal hernia    found during EGD   Idiopathic neuropathy    feet   NASH (nonalcoholic steatohepatitis)    chronic, follows w/ gastro, lov 12/14/21 in Epic   Wears glasses    Past Surgical History:  Procedure Laterality Date   CHOLECYSTECTOMY  2008   COLONOSCOPY  06/10/2019   multiple colon polyps   CYSTOSCOPY N/A 04/20/2022   Procedure: CYSTOSCOPY;  Surgeon: Marguerita Beards, MD;  Location: [facility];  Service: Gynecology;  Laterality: N/A;   ESOPHAGOGASTRODUODENOSCOPY     around 2018   EYE SURGERY Bilateral 07/15/2021   eye lids   LIVER BIOPSY  2005   LIVER BIOPSY  2019   steatohepatitis & moderate lymphoplasmacytic infiltrate & stage 2 -3 fibrosis   NASAL SINUS SURGERY     around 2001   [facility]  [facility]   ROBOTIC ASSISTED LAPAROSCOPIC SACROCOLPOPEXY N/A 04/20/2022   Procedure: XI ROBOTIC ASSISTED LAPAROSCOPIC SACROCOLPOPEXY;  Surgeon: Marguerita Beards, MD;  Location: [facility];  Service: Gynecology;  Laterality: N/A;   VAGINAL HYSTERECTOMY  1999   Family History  Problem Relation Age of Onset   Thyroid disease Mother    Diabetes Mother    Cancer Mother    Cancer Father    Lupus Daughter    Arthritis/Rheumatoid Daughter    Diabetes type II Daughter    Social History   Socioeconomic History   Marital status: Married    Spouse name: Not on file   Number of children: Not on file   Years of education: Not on file   Highest education level: Associate degree: occupational,  Scientist, product/process development, or vocational program  Occupational History   Not on file  Tobacco Use   Smoking status: Former    Current packs/day: 0.00    Average packs/day: 1 pack/day for 10.0 years (10.0 ttl pk-yrs)    Types: Cigarettes    Start date: 75    Quit date: 1988    Years since quitting: 37.1   Smokeless tobacco: Never  Vaping Use   Vaping status: Never Used  Substance and Sexual Activity   Alcohol use: Not Currently   Drug use: No   Sexual activity: Yes    Birth control/protection: Surgical  Other Topics Concern   Not on file  Social History Narrative   Patient resides with her spouse.  She has a son and a daughter, both of whom live within 1 hour of her home.  She also has pets.  No fall  risks however.  They are well-behaved.  She retired in June 2023.   Social Drivers of Corporate investment banker Strain: Low Risk  (05/05/2023)   Overall Financial Resource Strain (CARDIA)    Difficulty of Paying Living Expenses: Not hard at all  Food Insecurity: No Food Insecurity (05/05/2023)   Hunger Vital Sign    Worried About Running Out of Food in the Last Year: Never true    Ran Out of Food in the Last Year: Never true  Transportation Needs: No Transportation Needs (05/05/2023)   PRAPARE - Administrator, Civil Service (Medical): No    Lack of Transportation (Non-Medical): No  Physical Activity: Insufficiently Active (05/05/2023)   Exercise Vital Sign    Days of Exercise per Week: 3 days    Minutes of Exercise per Session: 30 min  Stress: No Stress Concern Present (05/05/2023)   [employer] of Occupational Health - Occupational Stress Questionnaire    Feeling of Stress : Not at all  Social Connections: Socially Integrated (05/05/2023)   Social Connection and Isolation Panel [NHANES]    Frequency of Communication with Friends and Family: More than three times a week    Frequency of Social Gatherings with Friends and Family: More than three times a week    Attends  Religious Services: More than 4 times per year    Active Member of [employer] or Organizations: Yes    Attends Engineer, structural: More than 4 times per year    Marital Status: Married    Tobacco Counseling Counseling given: Not Answered   Clinical Intake:  Pre-visit preparation completed: Yes  Pain : No/denies pain     Diabetes: Yes CBG done?: No Did pt. bring in CBG monitor from home?: No  How often do you need to have someone help you when you read instructions, pamphlets, or other written materials from your doctor or pharmacy?: 1 - Never  Interpreter Needed?: No  Information entered by :: Kandis Fantasia LPN   Activities of Daily Living    05/05/2023   11:05 AM  In your present state of health, do you have any difficulty performing the following activities:  Hearing? 0  Vision? 0  Difficulty concentrating or making decisions? 0  Walking or climbing stairs? 0  Dressing or bathing? 0  Doing errands, shopping? 0  Preparing Food and eating ? N  Using the Toilet? N  In the past six months, have you accidently leaked urine? N  Do you have problems with loss of bowel control? N  Managing your Medications? N  Managing your Finances? N  Housekeeping or managing your Housekeeping? N    Patient Care Team: Raliegh Ip, DO as PCP - General (Family Medicine) Wyline Mood Dorothe Pea, MD as PCP - Cardiology (Cardiology) Danella Maiers, [facility] as Pharmacist (Family Medicine) [place], Reeves Forth, NP as Nurse Practitioner (Nurse Practitioner)  Indicate any recent Medical Services you may have received from other than [facility] providers in the past year (date may be approximate).     Assessment:   This is a routine wellness examination for [facility].  Hearing/Vision screen Hearing Screening - Comments:: Denies hearing difficulties   Vision Screening - Comments:: Wears rx glasses - up to date with routine eye exams with Dr. Emily Filbert     Goals Addressed              This Visit's Progress    Remain active and independent  Depression Screen    05/05/2023   11:04 AM 03/01/2023    1:46 PM 01/16/2023    9:20 AM 11/09/2022    8:20 AM 08/05/2022    9:19 AM 06/14/2022    8:51 AM 05/23/2022   12:06 PM  PHQ 2/9 Scores  PHQ - 2 Score 0 0 0 0 0 0 0  PHQ- 9 Score  0 0 0 0 0 0    Fall Risk    05/05/2023   11:05 AM 03/01/2023    1:46 PM 01/16/2023    9:20 AM 11/09/2022    8:20 AM 08/05/2022    9:19 AM  Fall Risk   Falls in the past year? 0 0 0 0 0  Number falls in past yr: 0 0 0 0 0  Injury with Fall? 0 0 0 0 0  Risk for fall due to : No Fall Risks No Fall Risks No Fall Risks No Fall Risks No Fall Risks  Follow up Falls prevention discussed;Education provided;Falls evaluation completed Education provided Falls evaluation completed Education provided Education provided    MEDICARE RISK AT HOME: Medicare Risk at Home Any stairs in or around the home?: Yes If so, are there any without handrails?: No Home free of loose throw rugs in walkways, pet beds, electrical cords, etc?: Yes Adequate lighting in your home to reduce risk of falls?: Yes Life alert?: No Use of a cane, walker or w/c?: No Grab bars in the bathroom?: Yes Shower chair or bench in shower?: No Elevated toilet seat or a handicapped toilet?: Yes  TIMED UP AND GO:  Was the test performed?  No    Cognitive Function:    05/03/2022    2:12 PM  MMSE - Mini Mental State Exam  Orientation to time 5  Orientation to Place 5  Registration 3  Attention/ Calculation 5  Recall 3  Language- name 2 objects 2  Language- repeat 1  Language- follow 3 step command 3  Language- read & follow direction 1  Write a sentence 1  Copy design 1  Total score 30        05/05/2023   11:05 AM 05/03/2022    2:18 PM  6CIT Screen  What Year? 0 points 0 points  What month? 0 points 0 points  What time? 0 points 0 points  Count back from 20 0 points 0 points  Months in reverse 0 points 0 points  Repeat  phrase 0 points 0 points  Total Score 0 points 0 points    Immunizations Immunization History  Administered Date(s) Administered   Hep A / Hep B 03/24/2008, 04/23/2008, 09/08/2008   Influenza, Seasonal, Injecte, Preservative Fre 01/10/2008   Influenza,inj,Quad PF,6+ Mos 05/18/2016   Influenza-Unspecified 02/02/2018   PFIZER(Purple Top)SARS-COV-2 Vaccination 11/08/2019, 11/29/2019   PNEUMOCOCCAL CONJUGATE-20 10/29/2021   Pneumococcal Polysaccharide-23 11/30/2017   Zoster Recombinant(Shingrix) 12/12/2018, 06/12/2019    TDAP status: Due, Education has been provided regarding the importance of this vaccine. Advised may receive this vaccine at local pharmacy or Health Dept. Aware to provide a copy of the vaccination record if obtained from local pharmacy or Health Dept. Verbalized acceptance and understanding.  Flu Vaccine status: Declined, Education has been provided regarding the importance of this vaccine but patient still declined. Advised may receive this vaccine at local pharmacy or Health Dept. Aware to provide a copy of the vaccination record if obtained from local pharmacy or Health Dept. Verbalized acceptance and understanding.  Pneumococcal vaccine status: Up to date  Covid-19 vaccine status: Information provided on how to obtain vaccines.   Qualifies for Shingles Vaccine? Yes   Zostavax completed No   Shingrix Completed?: Yes  Screening Tests Health Maintenance  Topic Date Due   COVID-19 Vaccine (3 - Pfizer risk series) 12/27/2019   Diabetic kidney evaluation - Urine ACR  05/04/2023   INFLUENZA VACCINE  07/03/2023 (Originally 11/03/2022)   DTaP/Tdap/Td (1 - Tdap) 02/29/2024 (Originally 05/16/1974)   Hepatitis C Screening  02/29/2024 (Originally 05/15/1973)   Diabetic kidney evaluation - eGFR measurement  06/06/2023   HEMOGLOBIN A1C  08/22/2023   MAMMOGRAM  08/31/2023   OPHTHALMOLOGY EXAM  10/17/2023   FOOT EXAM  11/09/2023   Medicare Annual Wellness (AWV)  05/04/2024    Colonoscopy  04/03/2033   Pneumonia Vaccine 65+ Years old  Completed   DEXA SCAN  Completed   Zoster Vaccines- Shingrix  Completed   HPV VACCINES  Aged Out    Health Maintenance  Health Maintenance Due  Topic Date Due   COVID-19 Vaccine (3 - Pfizer risk series) 12/27/2019   Diabetic kidney evaluation - Urine ACR  05/04/2023    Colorectal cancer screening: Type of screening: Colonoscopy. Completed 04/04/23. Repeat every 10 years  Mammogram status: Completed 08/31/22. Repeat every year  Bone Density status: Completed 06/15/21. Results reflect: Bone density results: OSTEOPENIA. Repeat every 2 years.  Lung Cancer Screening: (Low Dose CT Chest recommended if Age 72-80 years, 20 pack-year currently smoking OR have quit w/in 15years.) does not qualify.   Lung Cancer Screening Referral: n/a  Additional Screening:  Hepatitis C Screening: does qualify  Vision Screening: Recommended annual ophthalmology exams for early detection of glaucoma and other disorders of the eye. Is the patient up to date with their annual eye exam?  Yes  Who is the provider or what is the name of the office in which the patient attends annual eye exams? Dr. Emily Filbert  If pt is not established with a provider, would they like to be referred to a provider to establish care? No .   Dental Screening: Recommended annual dental exams for proper oral hygiene  Diabetic Foot Exam: Diabetic Foot Exam: Completed 11/09/22  Community Resource Referral / Chronic Care Management: CRR required this visit?  No   CCM required this visit?  No     Plan:     I have personally reviewed and noted the following in the patient's chart:   Medical and social history Use of alcohol, tobacco or illicit drugs  Current medications and supplements including opioid prescriptions. Patient is not currently taking opioid prescriptions. Functional ability and status Nutritional status Physical activity Advanced directives List of other  physicians Hospitalizations, surgeries, and ER visits in previous 12 months Vitals Screenings to include cognitive, depression, and falls Referrals and appointments  In addition, I have reviewed and discussed with patient certain preventive protocols, quality metrics, and best practice recommendations. A written personalized care plan for preventive services as well as general preventive health recommendations were provided to patient.     Kandis Fantasia [place], [place]   1/61/0960   After Visit Summary: (MyChart) Due to this being a telephonic visit, the after visit summary with patients personalized plan was offered to patient via MyChart   Nurse Notes: No concerns at this time

## 2023-05-12 ENCOUNTER — Encounter (INDEPENDENT_AMBULATORY_CARE_PROVIDER_SITE_OTHER): Payer: Self-pay | Admitting: Family Medicine

## 2023-05-12 DIAGNOSIS — E1165 Type 2 diabetes mellitus with hyperglycemia: Secondary | ICD-10-CM | POA: Diagnosis not present

## 2023-05-12 DIAGNOSIS — Z7984 Long term (current) use of oral hypoglycemic drugs: Secondary | ICD-10-CM

## 2023-05-15 MED ORDER — TIRZEPATIDE 5 MG/0.5ML ~~LOC~~ SOAJ: 5.0000 mg | SUBCUTANEOUS | 0 refills | Status: DC

## 2023-05-15 MED ORDER — TIRZEPATIDE 2.5 MG/0.5ML ~~LOC~~ SOAJ: 2.5000 mg | SUBCUTANEOUS | 0 refills | Status: DC

## 2023-05-15 NOTE — Telephone Encounter (Signed)

## 2023-05-30 ENCOUNTER — Other Ambulatory Visit: Payer: Medicare Other

## 2023-05-30 ENCOUNTER — Encounter: Payer: Self-pay | Admitting: Family Medicine

## 2023-05-30 DIAGNOSIS — E1165 Type 2 diabetes mellitus with hyperglycemia: Secondary | ICD-10-CM

## 2023-05-30 DIAGNOSIS — K754 Autoimmune hepatitis: Secondary | ICD-10-CM

## 2023-05-30 LAB — BAYER DCA HB A1C WAIVED: HB A1C (BAYER DCA - WAIVED): 8 % — ABNORMAL HIGH (ref 4.8–5.6)

## 2023-05-31 LAB — COMPREHENSIVE METABOLIC PANEL
ALT: 30 IU/L (ref 0–32)
AST: 23 IU/L (ref 0–40)
Albumin: 4.2 g/dL (ref 3.9–4.9)
Alkaline Phosphatase: 53 IU/L (ref 44–121)
BUN/Creatinine Ratio: 13 (ref 12–28)
BUN: 12 mg/dL (ref 8–27)
Bilirubin Total: 0.5 mg/dL (ref 0.0–1.2)
CO2: 20 mmol/L (ref 20–29)
Calcium: 9.7 mg/dL (ref 8.7–10.3)
Chloride: 103 mmol/L (ref 96–106)
Creatinine, Ser: 0.89 mg/dL (ref 0.57–1.00)
Globulin, Total: 2.9 g/dL (ref 1.5–4.5)
Glucose: 106 mg/dL — ABNORMAL HIGH (ref 70–99)
Potassium: 3.8 mmol/L (ref 3.5–5.2)
Sodium: 140 mmol/L (ref 134–144)
Total Protein: 7.1 g/dL (ref 6.0–8.5)
eGFR: 71 mL/min/{1.73_m2} (ref >59)

## 2023-05-31 LAB — HEPATIC FUNCTION PANEL
ALT: 28 IU/L (ref 0–32)
AST: 22 IU/L (ref 0–40)
Albumin: 4.4 g/dL (ref 3.9–4.9)
Alkaline Phosphatase: 56 IU/L (ref 44–121)
Bilirubin Total: 0.5 mg/dL (ref 0.0–1.2)
Bilirubin, Direct: 0.19 mg/dL (ref 0.00–0.40)
Total Protein: 6.9 g/dL (ref 6.0–8.5)

## 2023-05-31 LAB — IGG: IgG (Immunoglobin G), Serum: 1352 mg/dL (ref 586–1602)

## 2023-05-31 LAB — CBC
Hematocrit: 42.4 % (ref 34.0–46.6)
Hemoglobin: 14.1 g/dL (ref 11.1–15.9)
MCH: 31.8 pg (ref 26.6–33.0)
MCHC: 33.3 g/dL (ref 31.5–35.7)
MCV: 96 fL (ref 79–97)
Platelets: 165 10*3/uL (ref 150–450)
RBC: 4.44 x10E6/uL (ref 3.77–5.28)
RDW: 12.1 % (ref 11.7–15.4)
WBC: 6.1 10*3/uL (ref 3.4–10.8)

## 2023-06-02 ENCOUNTER — Ambulatory Visit: Payer: Medicare Other | Admitting: Family Medicine

## 2023-06-02 ENCOUNTER — Encounter: Payer: Self-pay | Admitting: Family Medicine

## 2023-06-02 VITALS — BP 128/74 | HR 85 | Temp 98.6°F | Ht 65.0 in | Wt 199.8 lb

## 2023-06-02 DIAGNOSIS — Z7985 Long-term (current) use of injectable non-insulin antidiabetic drugs: Secondary | ICD-10-CM

## 2023-06-02 DIAGNOSIS — K754 Autoimmune hepatitis: Secondary | ICD-10-CM | POA: Diagnosis not present

## 2023-06-02 DIAGNOSIS — I152 Hypertension secondary to endocrine disorders: Secondary | ICD-10-CM

## 2023-06-02 DIAGNOSIS — E785 Hyperlipidemia, unspecified: Secondary | ICD-10-CM

## 2023-06-02 DIAGNOSIS — E1169 Type 2 diabetes mellitus with other specified complication: Secondary | ICD-10-CM

## 2023-06-02 DIAGNOSIS — E119 Type 2 diabetes mellitus without complications: Secondary | ICD-10-CM

## 2023-06-02 DIAGNOSIS — E1159 Type 2 diabetes mellitus with other circulatory complications: Secondary | ICD-10-CM

## 2023-06-02 MED ORDER — TIRZEPATIDE 2.5 MG/0.5ML ~~LOC~~ SOAJ: 2.5000 mg | SUBCUTANEOUS | 0 refills | Status: DC

## 2023-06-02 NOTE — Progress Notes (Signed)
 Subjective: CC:DM PCP: Raliegh Ip, DO ZOX:WRUE Taylor Leach is a 68 y.o. female presenting to clinic today for:  1. Type 2 Diabetes with hypertension, hyperlipidemia and fatty liver disease associated with autoimmune hepatitis:  She continues to be treated for adrenal insufficiency with oral corticosteroids but this is actually recently switched from budesonide to prednisone since apparently budesonide has a an increased risk of blood clotting.  She will be seeing her endocrinologist soon with ongoing management of the adrenal insufficiency.  She just saw her hepatologist last week and had a good check up. They are pleased that she was placed on Mounjaro.  She reports so far she is actually tolerating this okay.  She is only had 1 dose and it gave her a little bit of nausea but nothing that was severe.  She certainly reports of suppression and her appetite has dropped 3 pounds already just after the first dose.  She does not report any concerning GI side effects otherwise and is scheduled to do her next dose tomorrow.  Diabetes Health Maintenance Due  Topic Date Due   OPHTHALMOLOGY EXAM  10/17/2023   FOOT EXAM  11/09/2023   HEMOGLOBIN A1C  11/27/2023    Last A1c:  Lab Results  Component Value Date   HGBA1C 8.0 (H) 05/30/2023    ROS: Per HPI  Allergies  Allergen Reactions   Fenofibrate Micronized Other (See Comments)    Caused increased LFT's Caused increased LFT's   Glipizide     Hypoglycemia    Codeine Nausea And Vomiting   Farxiga [Dapagliflozin]     Bladder pains   Hydrocodone Nausea And Vomiting and Other (See Comments)    Makes body hot.    Janumet [Sitagliptin-Metformin Hcl]     Extremely bad gas    Tape     Can cause skin to tear   Past Medical History:  Diagnosis Date   Arthritis    hands   Atypical chest pain 04/14/2022   See cardiology OV note from 04/14/22 in Epic. Atypical chest pain thought to be related to a hiatal hernia.   Autoimmune hepatitis  (HCC)    Follows w/ gastroenterology. LOV 12/14/21 with Annie Sable, AGNP (as of 04/18/22) in Epic.   Chest pain    Stress echo normal, October, 2012   COVID-19 03/2021   flu like symptoms, patient was prescribed an antiviral   Diabetes mellitus (HCC) 07/28/2016   Type 2, follows w/ PCP, Dr. Doylene Canard, LOV 01/28/22 (as of 0/15/24.) HgbA1c 6.8 on 01/28/22.   Dyslipidemia    Follows w/ PCP.   Ejection fraction    EF 55-60%, echo, October, 2012   GERD (gastroesophageal reflux disease)    Hiatal hernia    found during EGD   Idiopathic neuropathy    feet   NASH (nonalcoholic steatohepatitis)    chronic, follows w/ gastro, lov 12/14/21 in Epic   Wears glasses     Current Outpatient Medications:    atorvastatin (LIPITOR) 10 MG tablet, Take 1 tablet (10 mg total) by mouth daily., Disp: 90 tablet, Rfl: 3   blood glucose meter kit and supplies KIT, Dispense based on patient and insurance preference. Use up to four times daily as directed. (FOR ICD-9 250.00, 250.01). E11.9, Disp: 1 each, Rfl: 0   cetirizine (ZYRTEC) 10 MG tablet, Take by mouth as needed., Disp: , Rfl:    Cholecalciferol (VITAMIN D-3 PO), Take 1 tablet by mouth daily., Disp: , Rfl:    ciclopirox (PENLAC) 8 %  solution, Apply topically at bedtime. Apply over nail and surrounding skin. Apply daily over previous coat. After seven (7) days, may remove with alcohol and continue cycle., Disp: 6.6 mL, Rfl: 1   CONTOUR NEXT TEST test strip, CHECK BLOOD SUGAR UP TO 4 TIMES A DAY OR AS DIRECTED, Disp: 400 each, Rfl: 2   fluorouracil (EFUDEX) 5 % cream, Apply topically 2 (two) times daily. X2-4 weeks, Disp: 40 g, Rfl: 1   fluticasone (FLONASE) 50 MCG/ACT nasal spray, Place 1 spray into both nostrils 2 (two) times daily as needed for allergies or rhinitis., Disp: 48 g, Rfl: 1   mycophenolate (MYFORTIC) 360 MG TBEC EC tablet, Take 360 mg by mouth 2 (two) times daily., Disp: , Rfl:    ofloxacin (FLOXIN) 0.3 % OTIC solution, Place 5  drops into the left ear daily., Disp: 5 mL, Rfl: 0   pantoprazole (PROTONIX) 40 MG tablet, Take 1 tablet (40 mg total) by mouth daily., Disp: 90 tablet, Rfl: 3   predniSONE (DELTASONE) 5 MG tablet, Take 1 tablet (5 mg total) by mouth daily with breakfast., Disp: 90 tablet, Rfl: 1   tirzepatide (MOUNJARO) 2.5 MG/0.5ML Pen, Inject 2.5 mg into the skin once a week. Month#1 Failed ozempic., Disp: 2 mL, Rfl: 0   tirzepatide (MOUNJARO) 5 MG/0.5ML Pen, Inject 5 mg into the skin once a week. Month #2, Disp: 6 mL, Rfl: 0   triamcinolone cream (KENALOG) 0.1 %, Apply 1 Application topically 2 (two) times daily as needed (eczema flare for up to 7 days)., Disp: 80 g, Rfl: 1 Social History   Socioeconomic History   Marital status: Married    Spouse name: Not on file   Number of children: Not on file   Years of education: Not on file   Highest education level: Associate degree: occupational, Scientist, product/process development, or vocational program  Occupational History   Not on file  Tobacco Use   Smoking status: Former    Current packs/day: 0.00    Average packs/day: 1 pack/day for 10.0 years (10.0 ttl pk-yrs)    Types: Cigarettes    Start date: 57    Quit date: 40    Years since quitting: 37.1   Smokeless tobacco: Never  Vaping Use   Vaping status: Never Used  Substance and Sexual Activity   Alcohol use: Not Currently   Drug use: No   Sexual activity: Yes    Birth control/protection: Surgical  Other Topics Concern   Not on file  Social History Narrative   Patient resides with her spouse.  She has a son and a daughter, both of whom live within 1 hour of her home.  She also has pets.  No fall risks however.  They are well-behaved.  She retired in June 2023.   Social Drivers of Corporate investment banker Strain: Low Risk  (05/30/2023)   Overall Financial Resource Strain (CARDIA)    Difficulty of Paying Living Expenses: Not very hard  Food Insecurity: No Food Insecurity (05/30/2023)   Hunger Vital Sign     Worried About Running Out of Food in the Last Year: Never true    Ran Out of Food in the Last Year: Never true  Transportation Needs: No Transportation Needs (05/30/2023)   PRAPARE - Administrator, Civil Service (Medical): No    Lack of Transportation (Non-Medical): No  Physical Activity: Insufficiently Active (05/30/2023)   Exercise Vital Sign    Days of Exercise per Week: 2 days  Minutes of Exercise per Session: 20 min  Stress: No Stress Concern Present (05/30/2023)   [employer] of Occupational Health - Occupational Stress Questionnaire    Feeling of Stress : Not at all  Social Connections: Socially Integrated (05/30/2023)   Social Connection and Isolation Panel [NHANES]    Frequency of Communication with Friends and Family: More than three times a week    Frequency of Social Gatherings with Friends and Family: Twice a week    Attends Religious Services: More than 4 times per year    Active Member of [employer] or Organizations: Yes    Attends Engineer, structural: More than 4 times per year    Marital Status: Married  Catering manager Violence: Not At Risk (05/05/2023)   Humiliation, Afraid, Rape, and Kick questionnaire    Fear of Current or Ex-Partner: No    Emotionally Abused: No    Physically Abused: No    Sexually Abused: No   Family History  Problem Relation Age of Onset   Thyroid disease Mother    Diabetes Mother    Cancer Mother    Cancer Father    Lupus Daughter    Arthritis/Rheumatoid Daughter    Diabetes type II Daughter     Objective: Office vital signs reviewed. BP 128/74   Pulse 85   Temp 98.6 F (37 C)   Ht 5\' 5"  (1.651 m)   SpO2 95%   BMI 33.95 kg/m   Physical Examination:  General: Awake, alert, obese, nontoxic-appearing female, No acute distress HEENT: Normal    Eyes: PERRLA, extraocular membranes intact, sclera white Cardio: regular rate and rhythm, S1S2 heard, no murmurs appreciated Pulm: clear to auscultation  bilaterally, no wheezes, rhonchi or rales; normal work of breathing on room air    Assessment/ Plan: 69 y.o. female   Diabetes mellitus treated with injections of non-insulin medication (HCC) - Plan: Microalbumin / creatinine urine ratio, Bayer DCA Hb A1c Waived, tirzepatide (MOUNJARO) 2.5 MG/0.5ML Pen  Hypertension associated with diabetes (HCC)  Hyperlipidemia associated with type 2 diabetes mellitus (HCC) - Plan: Lipid panel  Autoimmune hepatitis (HCC) - Plan: Hepatic Function Panel, IGG, CBC  Sugar not at goal but she has only had 1 dose of Mounjaro so I would like her to continue this medication at 2.5 mg as tolerated and then advance to 5 mg when ready.  It sounds like blood sugars are running good at less than 100 so far and just this low dose some optimistic about how this will help her.  She will have urine microalbumin along with her other labs drawn in 3 months  Future orders have been placed.  Blood pressure well-controlled.  No changes needed.  Continue statin and treatment for autoimmune hepatitis as directed by her specialists  Additionally, vaccination for flu and tetanus were offered today but she declined these.  She will take tetanus at next visit  Raliegh Ip, DO [facility] [phone]

## 2023-06-05 ENCOUNTER — Encounter: Payer: Self-pay | Admitting: Cardiology

## 2023-06-05 ENCOUNTER — Ambulatory Visit: Attending: Cardiology | Admitting: Cardiology

## 2023-06-05 VITALS — BP 138/84 | HR 74 | Ht 65.0 in | Wt 201.6 lb

## 2023-06-05 DIAGNOSIS — R002 Palpitations: Secondary | ICD-10-CM | POA: Diagnosis present

## 2023-06-05 DIAGNOSIS — I959 Hypotension, unspecified: Secondary | ICD-10-CM

## 2023-06-05 NOTE — Progress Notes (Signed)
 Clinical Summary [name] is a 68 y.o.female seen today for follow up of the following medical problems.    1. Palpitations - 08/2019 monitor showed SR with PVCs 08/2022 monitor: 14 day monitor, min HR 46, Max HR 167, Avg 70. 13 runs SVT longest 16 sec, otherwise rare ectopy.  - infrequent, about 1-2 times per week. Can last up to 15-20 minutes - 1 cup of coffee in AM, rare sodas, rare tea, no energy drinks, no EtOH   - no recent symptoms - limiting caffeine.      2. Orthostatic dizziness - prior issues with low bp's, dizziness - random AM cortisol was low at 1.9, referred to endocine - from there notes partial adrenal insufficiency, started on prednisone.  - improved dizziness and bp' since last visit       3. Chest pain - episode in June - was at families house, sitting and talking at the table - lasted about 1 minute. Sharp pain/fullness, 8/10 in severity. No other associated symptoms. Not positional. No prior symptoms, none since. - walks 2 miles in the morning at times, last was a week ago. No specific exertional symptoms.   - no significant symptoms. No exertional symptoms.      4. Partial adrenal insufficiency - followed by endo - has prior history of long term corticosteroid use.        4. Autoimmune hepatitis - followed by rheum - Followed by [facility] hepatology       Past Medical History:  Diagnosis Date   Arthritis    hands   Atypical chest pain 04/14/2022   See cardiology OV note from 04/14/22 in Epic. Atypical chest pain thought to be related to a hiatal hernia.   Autoimmune hepatitis (HCC)    Follows w/ gastroenterology. LOV 12/14/21 with Annie Sable, AGNP (as of 04/18/22) in Epic.   Chest pain    Stress echo normal, October, 2012   COVID-19 03/2021   flu like symptoms, patient was prescribed an antiviral   Diabetes mellitus (HCC) 07/28/2016   Type 2, follows w/ PCP, Dr. Doylene Canard, LOV 01/28/22 (as of 0/15/24.) HgbA1c 6.8 on 01/28/22.    Dyslipidemia    Follows w/ PCP.   Ejection fraction    EF 55-60%, echo, October, 2012   GERD (gastroesophageal reflux disease)    Hiatal hernia    found during EGD   Idiopathic neuropathy    feet   NASH (nonalcoholic steatohepatitis)    chronic, follows w/ gastro, lov 12/14/21 in Epic   Wears glasses      Allergies  Allergen Reactions   Fenofibrate Micronized Other (See Comments)    Caused increased LFT's Caused increased LFT's   Glipizide     Hypoglycemia    Codeine Nausea And Vomiting   Farxiga [Dapagliflozin]     Bladder pains   Hydrocodone Nausea And Vomiting and Other (See Comments)    Makes body hot.    Janumet [Sitagliptin-Metformin Hcl]     Extremely bad gas    Tape     Can cause skin to tear     Current Outpatient Medications  Medication Sig Dispense Refill   atorvastatin (LIPITOR) 10 MG tablet Take 1 tablet (10 mg total) by mouth daily. 90 tablet 3   blood glucose meter kit and supplies KIT Dispense based on patient and insurance preference. Use up to four times daily as directed. (FOR ICD-9 250.00, 250.01). E11.9 1 each 0   cetirizine (ZYRTEC) 10 MG  tablet Take by mouth as needed.     Cholecalciferol (VITAMIN D-3 PO) Take 1 tablet by mouth daily.     ciclopirox (PENLAC) 8 % solution Apply topically at bedtime. Apply over nail and surrounding skin. Apply daily over previous coat. After seven (7) days, may remove with alcohol and continue cycle. 6.6 mL 1   CONTOUR NEXT TEST test strip CHECK BLOOD SUGAR UP TO 4 TIMES A DAY OR AS DIRECTED 400 each 2   fluorouracil (EFUDEX) 5 % cream Apply topically 2 (two) times daily. X2-4 weeks 40 g 1   fluticasone (FLONASE) 50 MCG/ACT nasal spray Place 1 spray into both nostrils 2 (two) times daily as needed for allergies or rhinitis. 48 g 1   mycophenolate (MYFORTIC) 360 MG TBEC EC tablet Take 360 mg by mouth 2 (two) times daily.     ofloxacin (FLOXIN) 0.3 % OTIC solution Place 5 drops into the left ear daily. 5 mL 0    pantoprazole (PROTONIX) 40 MG tablet Take 1 tablet (40 mg total) by mouth daily. 90 tablet 3   predniSONE (DELTASONE) 5 MG tablet Take 1 tablet (5 mg total) by mouth daily with breakfast. 90 tablet 1   tirzepatide (MOUNJARO) 2.5 MG/0.5ML Pen Inject 2.5 mg into the skin once a week. 6 mL 0   tirzepatide (MOUNJARO) 5 MG/0.5ML Pen Inject 5 mg into the skin once a week. Month #2 6 mL 0   triamcinolone cream (KENALOG) 0.1 % Apply 1 Application topically 2 (two) times daily as needed (eczema flare for up to 7 days). 80 g 1   No current facility-administered medications for this visit.     Past Surgical History:  Procedure Laterality Date   CHOLECYSTECTOMY  2008   COLONOSCOPY  06/10/2019   multiple colon polyps   CYSTOSCOPY N/A 04/20/2022   Procedure: CYSTOSCOPY;  Surgeon: Marguerita Beards, MD;  Location: [facility];  Service: Gynecology;  Laterality: N/A;   ESOPHAGOGASTRODUODENOSCOPY     around 2018   EYE SURGERY Bilateral 07/15/2021   eye lids   LIVER BIOPSY  2005   LIVER BIOPSY  2019   steatohepatitis & moderate lymphoplasmacytic infiltrate & stage 2 -3 fibrosis   NASAL SINUS SURGERY     around 2001   [facility]ELE [facility]  [facility]   ROBOTIC ASSISTED LAPAROSCOPIC SACROCOLPOPEXY N/A 04/20/2022   Procedure: XI ROBOTIC ASSISTED LAPAROSCOPIC SACROCOLPOPEXY;  Surgeon: Marguerita Beards, MD;  Location: [facility];  Service: Gynecology;  Laterality: N/A;   VAGINAL HYSTERECTOMY  1999     Allergies  Allergen Reactions   Fenofibrate Micronized Other (See Comments)    Caused increased LFT's Caused increased LFT's   Glipizide     Hypoglycemia    Codeine Nausea And Vomiting   Farxiga [Dapagliflozin]     Bladder pains   Hydrocodone Nausea And Vomiting and Other (See Comments)    Makes body hot.    Janumet [Sitagliptin-Metformin Hcl]     Extremely bad gas    Tape     Can cause skin to tear      Family History   Problem Relation Age of Onset   Thyroid disease Mother    Diabetes Mother    Cancer Mother    Cancer Father    Lupus Daughter    Arthritis/Rheumatoid Daughter    Diabetes type II Daughter      Social History Ms. Janvier reports that she quit smoking about 37  years ago. Her smoking use included cigarettes. She started smoking about 47 years ago. She has a 10 pack-year smoking history. She has never used smokeless tobacco. Ms. Fundora reports that she does not currently use alcohol.    Physical Examination Today's Vitals   06/05/23 1353  BP: 138/84  Pulse: 74  SpO2: 97%  Weight: 201 lb 9.6 oz (91.4 kg)  Height: 5\' 5"  (1.651 m)   Body mass index is 33.55 kg/m.  Gen: resting comfortably, no acute distress HEENT: no scleral icterus, pupils equal round and reactive, no palptable cervical adenopathy,  CV: RRR, no mrg, no jvd Resp: Clear to auscultation bilaterally GI: abdomen is soft, non-tender, non-distended, normal bowel sounds, no hepatosplenomegaly MSK: extremities are warm, no edema.  Skin: warm, no rash Neuro:  no focal deficits Psych: appropriate affect   Diagnostic Studies  10/2022 echo 1. Left ventricular ejection fraction, by estimation, is 60 to 65%. The  left ventricle has normal function. The left ventricle has no regional  wall motion abnormalities. There is mild left ventricular hypertrophy.  Left ventricular diastolic parameters  are consistent with Grade I diastolic dysfunction (impaired relaxation).   2. Right ventricular systolic function is normal. The right ventricular  size is normal.   3. The mitral valve is normal in structure. No evidence of mitral valve  regurgitation. No evidence of mitral stenosis.   4. The aortic valve is tricuspid. Aortic valve regurgitation is not  visualized. No aortic stenosis is present.   5. Aortic dilatation noted. There is mild dilatation of the ascending  aorta, measuring 36 mm.   6. The inferior vena cava is normal  in size with greater than 50%  respiratory variability, suggesting right atrial pressure of 3 mmHg.    Assessment and Plan   1. Palpitations -rare ectopy on recent monitor, rare short episodes SVT - overall doing well without sigifnicant symptosm, continue to monitor - EKG today shows NSR   2. Low blood pressure/dizziness/ adrenal insufficiency - followed by endocrine, symptoms have improved on prednisone - continue current therapy per endocrine.    F/u 6 months  Antoine Poche, M.D.

## 2023-06-05 NOTE — Patient Instructions (Signed)

## 2023-06-08 ENCOUNTER — Encounter: Payer: Self-pay | Admitting: Family Medicine

## 2023-06-13 ENCOUNTER — Ambulatory Visit: Payer: Medicare Other | Admitting: "Endocrinology

## 2023-06-14 ENCOUNTER — Ambulatory Visit (INDEPENDENT_AMBULATORY_CARE_PROVIDER_SITE_OTHER): Admitting: "Endocrinology

## 2023-06-14 ENCOUNTER — Encounter: Payer: Self-pay | Admitting: "Endocrinology

## 2023-06-14 VITALS — BP 138/80 | HR 68 | Ht 65.0 in | Wt 200.4 lb

## 2023-06-14 DIAGNOSIS — E2740 Unspecified adrenocortical insufficiency: Secondary | ICD-10-CM | POA: Diagnosis not present

## 2023-06-14 DIAGNOSIS — Z7985 Long-term (current) use of injectable non-insulin antidiabetic drugs: Secondary | ICD-10-CM | POA: Diagnosis not present

## 2023-06-14 DIAGNOSIS — E119 Type 2 diabetes mellitus without complications: Secondary | ICD-10-CM

## 2023-06-14 DIAGNOSIS — E782 Mixed hyperlipidemia: Secondary | ICD-10-CM

## 2023-06-14 NOTE — Progress Notes (Signed)
 06/14/2023, 10:26 AM   Endocrinology follow-up note  Subjective:    Patient ID: Taylor Leach, female    DOB: 06-Oct-1955, PCP Raliegh Ip, DO   Past Medical History:  Diagnosis Date   Arthritis    hands   Atypical chest pain 04/14/2022   See cardiology OV note from 04/14/22 in Epic. Atypical chest pain thought to be related to a hiatal hernia.   Autoimmune hepatitis (HCC)    Follows w/ gastroenterology. LOV 12/14/21 with Annie Sable, AGNP (as of 04/18/22) in Epic.   Chest pain    Stress echo normal, October, 2012   COVID-19 03/2021   flu like symptoms, patient was prescribed an antiviral   Diabetes mellitus (HCC) 07/28/2016   Type 2, follows w/ PCP, Dr. Doylene Canard, LOV 01/28/22 (as of 0/15/24.) HgbA1c 6.8 on 01/28/22.   Dyslipidemia    Follows w/ PCP.   Ejection fraction    EF 55-60%, echo, October, 2012   GERD (gastroesophageal reflux disease)    Hiatal hernia    found during EGD   Idiopathic neuropathy    feet   NASH (nonalcoholic steatohepatitis)    chronic, follows w/ gastro, lov 12/14/21 in Epic   Wears glasses    Past Surgical History:  Procedure Laterality Date   CHOLECYSTECTOMY  2008   COLONOSCOPY  06/10/2019   multiple colon polyps   CYSTOSCOPY N/A 04/20/2022   Procedure: CYSTOSCOPY;  Surgeon: Marguerita Beards, MD;  Location: [facility];  Service: Gynecology;  Laterality: N/A;   ESOPHAGOGASTRODUODENOSCOPY     around 2018   EYE SURGERY Bilateral 07/15/2021   eye lids   LIVER BIOPSY  2005   LIVER BIOPSY  2019   steatohepatitis & moderate lymphoplasmacytic infiltrate & stage 2 -3 fibrosis   NASAL SINUS SURGERY     around 2001   RECTOCELE REPAIR  2014   [facility]   ROBOTIC ASSISTED LAPAROSCOPIC SACROCOLPOPEXY N/A 04/20/2022   Procedure: XI ROBOTIC ASSISTED LAPAROSCOPIC SACROCOLPOPEXY;  Surgeon: Marguerita Beards, MD;  Location: [facility];  Service: Gynecology;  Laterality: N/A;   VAGINAL HYSTERECTOMY  1999   Social History   Socioeconomic History   Marital status: Married    Spouse name: Not on file   Number of children: Not on file   Years of education: Not on file   Highest education level: Associate degree: occupational, Scientist, product/process development, or vocational program  Occupational History   Not on file  Tobacco Use   Smoking status: Former    Current packs/day: 0.00    Average packs/day: 1 pack/day for 10.0 years (10.0 ttl pk-yrs)    Types: Cigarettes    Start date: 99    Quit date: 36    Years since quitting: 37.2   Smokeless tobacco: Never  Vaping Use   Vaping status: Never Used  Substance and Sexual Activity   Alcohol use: Not Currently   Drug use: No   Sexual activity: Yes    Birth control/protection: Surgical  Other Topics Concern   Not on file  Social History Narrative   Patient resides with her spouse.  She has a son and a daughter, both of whom live  within 1 hour of her home.  She also has pets.  No fall risks however.  They are well-behaved.  She retired in June 2023.   Social Drivers of Corporate investment banker Strain: Low Risk  (05/30/2023)   Overall Financial Resource Strain (CARDIA)    Difficulty of Paying Living Expenses: Not very hard  Food Insecurity: No Food Insecurity (05/30/2023)   Hunger Vital Sign    Worried About Running Out of Food in the Last Year: Never true    Ran Out of Food in the Last Year: Never true  Transportation Needs: No Transportation Needs (05/30/2023)   PRAPARE - Administrator, Civil Service (Medical): No    Lack of Transportation (Non-Medical): No  Physical Activity: Insufficiently Active (05/30/2023)   Exercise Vital Sign    Days of Exercise per Week: 2 days    Minutes of Exercise per Session: 20 min  Stress: No Stress Concern Present (05/30/2023)   [employer] of Occupational Health - Occupational Stress Questionnaire    Feeling  of Stress : Not at all  Social Connections: Socially Integrated (05/30/2023)   Social Connection and Isolation Panel [NHANES]    Frequency of Communication with Friends and Family: More than three times a week    Frequency of Social Gatherings with Friends and Family: Twice a week    Attends Religious Services: More than 4 times per year    Active Member of [employer] or Organizations: Yes    Attends Engineer, structural: More than 4 times per year    Marital Status: Married   Family History  Problem Relation Age of Onset   Thyroid disease Mother    Diabetes Mother    Cancer Mother    Cancer Father    Lupus Daughter    Arthritis/Rheumatoid Daughter    Diabetes type II Daughter    Outpatient Encounter Medications as of 06/14/2023  Medication Sig   atorvastatin (LIPITOR) 10 MG tablet Take 1 tablet (10 mg total) by mouth daily.   blood glucose meter kit and supplies KIT Dispense based on patient and insurance preference. Use up to four times daily as directed. (FOR ICD-9 250.00, 250.01). E11.9   cetirizine (ZYRTEC) 10 MG tablet Take by mouth as needed.   Cholecalciferol (VITAMIN D-3 PO) Take 1 tablet by mouth daily.   ciclopirox (PENLAC) 8 % solution Apply topically at bedtime. Apply over nail and surrounding skin. Apply daily over previous coat. After seven (7) days, may remove with alcohol and continue cycle.   fluorouracil (EFUDEX) 5 % cream Apply topically 2 (two) times daily. X2-4 weeks   fluticasone (FLONASE) 50 MCG/ACT nasal spray Place 1 spray into both nostrils 2 (two) times daily as needed for allergies or rhinitis.   mycophenolate (MYFORTIC) 360 MG TBEC EC tablet Take 360 mg by mouth 2 (two) times daily.   pantoprazole (PROTONIX) 40 MG tablet Take 1 tablet (40 mg total) by mouth daily.   predniSONE (DELTASONE) 5 MG tablet Take 1 tablet (5 mg total) by mouth daily with breakfast.   tirzepatide (MOUNJARO) 2.5 MG/0.5ML Pen Inject 2.5 mg into the skin once a week.    tirzepatide [facility]) 5 MG/0.5ML Pen Inject 5 mg into the skin once a week. Month #2   triamcinolone cream (KENALOG) 0.1 % Apply 1 Application topically 2 (two) times daily as needed (eczema flare for up to 7 days).   [DISCONTINUED] CONTOUR NEXT TEST test strip CHECK BLOOD SUGAR UP TO 4 TIMES A  DAY OR AS DIRECTED   [DISCONTINUED] ofloxacin (FLOXIN) 0.3 % OTIC solution Place 5 drops into the left ear daily.   No facility-administered encounter medications on file as of 06/14/2023.   ALLERGIES: Allergies  Allergen Reactions   Fenofibrate Micronized Other (See Comments)    Caused increased LFT's Caused increased LFT's   Glipizide     Hypoglycemia    Codeine Nausea And Vomiting   Farxiga [Dapagliflozin]     Bladder pains   Hydrocodone Nausea And Vomiting and Other (See Comments)    Makes body hot.    Janumet [Sitagliptin-Metformin Hcl]     Extremely bad gas    Tape     Can cause skin to tear    VACCINATION STATUS: Immunization History  Administered Date(s) Administered   Hep A / Hep B 03/24/2008, 04/23/2008, 09/08/2008   Influenza, Seasonal, Injecte, Preservative Fre 01/10/2008   Influenza,inj,Quad PF,6+ Mos 05/18/2016   Influenza-Unspecified 02/02/2018   PFIZER(Purple Top)SARS-COV-2 Vaccination 11/08/2019, 11/29/2019   PNEUMOCOCCAL CONJUGATE-20 10/29/2021   Pneumococcal Polysaccharide-23 11/30/2017   Zoster Recombinant(Shingrix) 12/12/2018, 06/12/2019    HPI Taylor Leach is 68 y.o. female who presents today with a medical history as above. she is being seen in follow-up after she was seen in consultation for hypercortisolemia requested by Raliegh Ip, DO.   She gives history of exposure to high-dose steroids related to her autoimmune hepatitis in the past.  Although her exposure ranges between 6 to 7 years Mizera or prednisone as high as 60 mg daily at times. Due to her labs  ( ACTH stim test) showing partial primary adrenal insuff , she was started on prednisone 5mg   po daily. She was recently taken off of her budesonide 3 mg p.o. daily. She does not report new complaints.  She denies family history of adrenal dysfunction.  Her other medical problems include type 2 diabetes and hyperlipidemia on treatment.  She does not have hypertension.   She was initiated on Mounjaro by  her PMD. Her other current medications include Myfortic acid, Protonix, budesonide, vitamin D.  Her most recent A1c measurements range between 6-8%. She did not tolerate metformin in the past.   Review of Systems  Constitutional: +mildly fluctuating body weight ,  + fatigue, no subjective hyperthermia, no subjective hypothermia Eyes: no blurry vision, no xerophthalmia   Objective:       06/14/2023    9:30 AM 06/05/2023    1:53 PM 06/02/2023   12:57 PM  Vitals with BMI  Height 5\' 5"  5\' 5"  5\' 5"   Weight 200 lbs 6 oz 201 lbs 10 oz 199 lbs 13 oz  BMI 33.35 33.55 33.25  Systolic 138 138 621  Diastolic 80 84 74  Pulse 68 74 85    BP 138/80   Pulse 68   Ht 5\' 5"  (1.651 m)   Wt 200 lb 6.4 oz (90.9 kg)   BMI 33.35 kg/m   Wt Readings from Last 3 Encounters:  06/14/23 200 lb 6.4 oz (90.9 kg)  06/05/23 201 lb 9.6 oz (91.4 kg)  06/02/23 199 lb 12.8 oz (90.6 kg)    Physical Exam  Constitutional:  Body mass index is 33.35 kg/m.,  not in acute distress, normal state of mind Eyes: PERRLA, EOMI, no exophthalmos ENT: moist mucous membranes, no gross thyromegaly, no gross cervical lymphadenopathy   CMP ( most recent) CMP     Component Value Date/Time   NA 140 05/30/2023 0825   K 3.8 05/30/2023 0825   CL  103 05/30/2023 0825   CO2 20 05/30/2023 0825   GLUCOSE 106 (H) 05/30/2023 0825   GLUCOSE 120 (H) 06/06/2022 1848   BUN 12 05/30/2023 0825   CREATININE 0.89 05/30/2023 0825   CALCIUM 9.7 05/30/2023 0825   PROT 7.1 05/30/2023 0825   ALBUMIN 4.2 05/30/2023 0825   AST 23 05/30/2023 0825   ALT 30 05/30/2023 0825   ALKPHOS 53 05/30/2023 0825   BILITOT 0.5 05/30/2023 0825    EGFR 71 05/30/2023 0825   GFRNONAA >60 06/06/2022 1848     Diabetic Labs (most recent): Lab Results  Component Value Date   HGBA1C 8.0 (H) 05/30/2023   HGBA1C 7.9 (H) 02/22/2023   HGBA1C 7.0 (H) 11/09/2022     Lipid Panel ( most recent) Lipid Panel     Component Value Date/Time   CHOL 148 08/05/2022 0934   TRIG 126 08/05/2022 0934   HDL 52 08/05/2022 0934   CHOLHDL 2.8 08/05/2022 0934   CHOLHDL 4.1 10/23/2010 1748   VLDL 40 10/23/2010 1748   LDLCALC 74 08/05/2022 0934   LABVLDL 22 08/05/2022 0934      Lab Results  Component Value Date   TSH 2.440 08/05/2022   TSH 1.747 06/06/2022   TSH 1.990 07/16/2019   TSH 4.050 06/07/2019   TSH 2.640 08/10/2018   FREET4 1.33 08/05/2022      Assessment & Plan:   Hypocortisolism  2.  Type 2 diabetes  3. Hyperlipidemia  - Taylor Leach  is being seen at a kind request of Delynn Flavin M, DO. - I have reviewed her new and available  records and clinically evaluated the patient. 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 daily at breakfast.  I advised her to wear a medic alert.     She has several years of exposure to high-dose steroids orally to treat autoimmune hepatitis.  This seems to be her highest risk for adrenal insufficiency.  She will have AM cortisol before next visit ( will hold off prednisone for 4 days). -Regarding her diagnosis of type 2 diabetes with A1c ranging between 6-8%.  She is initiated on  Mounjaro currently 5 mg Nuevo weekly, advised to continue.   I discussed dietary approach for her to avoid processed carbs and processed meat . She will have A1c measurement subsequently. Her LDL is controlled at 74. She is on Atorvastatin 10 mg po at bedtime.   - she is advised to maintain close follow up with Raliegh Ip, DO  for primary care needs.   I spent  25 minutes in the care of the patient today including review of labs from Thyroid Function, CMP, and other relevant labs ; imaging/biopsy records (current and previous including abstractions from other facilities); face-to-face time discussing  her lab results and symptoms, medications doses, her options of short and long term treatment based on the latest standards of care / guidelines;   and documenting the encounter.  Taylor Leach  participated in the discussions, expressed understanding, and voiced agreement with the above plans.  All questions were answered to her satisfaction. she is encouraged to contact clinic should she have any questions or concerns prior to her return visit.    Follow up plan: Return in about 3 months (around 09/14/2023), or Hold prednisone for 4 days before your next  blood draw, for Fasting Labs  in AM B4 8.   Marquis Lunch, MD [facility] [address] [place], [place] [address] Phone: [phone]  Fax: [phone]     06/14/2023, 10:26 AM  This note was partially dictated with voice recognition software. Similar sounding words can be transcribed inadequately or may not  be corrected upon review.

## 2023-06-14 NOTE — Patient Instructions (Signed)

## 2023-06-28 ENCOUNTER — Encounter: Payer: Self-pay | Admitting: "Endocrinology

## 2023-06-29 ENCOUNTER — Other Ambulatory Visit: Payer: Self-pay

## 2023-06-29 DIAGNOSIS — E2740 Unspecified adrenocortical insufficiency: Secondary | ICD-10-CM

## 2023-06-29 MED ORDER — PREDNISONE 5 MG PO TABS: 5.0000 mg | ORAL_TABLET | Freq: Every day | ORAL | 1 refills | Status: DC

## 2023-08-24 ENCOUNTER — Ambulatory Visit: Admitting: Family Medicine

## 2023-08-24 ENCOUNTER — Encounter: Payer: Self-pay | Admitting: Family Medicine

## 2023-08-24 VITALS — BP 152/75 | HR 71 | Temp 97.3°F | Ht 65.0 in | Wt 197.0 lb

## 2023-08-24 DIAGNOSIS — M2550 Pain in unspecified joint: Secondary | ICD-10-CM | POA: Diagnosis not present

## 2023-08-24 DIAGNOSIS — W57XXXA Bitten or stung by nonvenomous insect and other nonvenomous arthropods, initial encounter: Secondary | ICD-10-CM | POA: Diagnosis not present

## 2023-08-24 DIAGNOSIS — H6593 Unspecified nonsuppurative otitis media, bilateral: Secondary | ICD-10-CM | POA: Diagnosis not present

## 2023-08-24 DIAGNOSIS — S20461A Insect bite (nonvenomous) of right back wall of thorax, initial encounter: Secondary | ICD-10-CM

## 2023-08-24 MED ORDER — FLUTICASONE PROPIONATE 50 MCG/ACT NA SUSP: 2.0000 | Freq: Every day | NASAL | 6 refills | Status: DC

## 2023-08-24 MED ORDER — LEVOCETIRIZINE DIHYDROCHLORIDE 5 MG PO TABS: 5.0000 mg | ORAL_TABLET | Freq: Every evening | ORAL | 1 refills | Status: AC

## 2023-08-24 NOTE — Progress Notes (Signed)
 Subjective:  Patient ID: Taylor Leach, female    DOB: 1955/07/19, 68 y.o.   MRN: [id number]  Patient Care Team: Eliodoro Guerin, DO as PCP - General (Family Medicine) Amanda Jungling, Joyceann No, MD as PCP - Cardiology (Cardiology) Delilah Fend, [facility] as Pharmacist (Family Medicine) Yoxheimer, Myrtie Atkinson, NP as Nurse Practitioner (Nurse Practitioner)   Chief Complaint:  ear pressure  (Bilateral on and off for a few months ) and Labs Only (Patient had a tick bite on right side x 1 1/2 months ago and would like to be tested. )   HPI: Taylor Leach is a 68 y.o. female presenting on 08/24/2023 for ear pressure  (Bilateral on and off for a few months ) and Labs Only (Patient had a tick bite on right side x 1 1/2 months ago and would like to be tested. )   Taylor Leach is a 68 year old female who presents with ear fullness and joint pain following a tick bite.  She experiences intermittent ear fullness, described as a sensation of being 'in a barrel' with echoing sounds, for the past couple of weeks. There is also significant tinnitus. She has been taking Zyrtec  and over-the-counter Mucinex sinus medicine.  Approximately a month and a half ago, she was bitten by a tick, resulting in a persistent red spot that is still faintly visible. Severe joint pain in her knees, hands, and shoulders began a couple of weeks after the tick bite. No fever or rash followed the bite.  Her husband has alpha-gal syndrome, and she follows a similar diet, avoiding beef. She has a history of sinus issues and has seen an ENT for sinus polyps in the past. She uses Flonase  intermittently, which she obtains through [facility]. She also mentions having an autoimmune disease affecting her liver, which she feels might be contributing to her symptoms.       Relevant past medical, surgical, family, and social history reviewed and updated as indicated.  Allergies and medications reviewed and updated. Data reviewed: Chart in  Epic.   Past Medical History:  Diagnosis Date   Arthritis    hands   Atypical chest pain 04/14/2022   See cardiology OV note from 04/14/22 in Epic. Atypical chest pain thought to be related to a hiatal hernia.   Autoimmune hepatitis (HCC)    Follows w/ gastroenterology. LOV 12/14/21 with Haskell Linker, AGNP (as of 04/18/22) in Epic.   Chest pain    Stress echo normal, October, 2012   COVID-19 03/2021   flu like symptoms, patient was prescribed an antiviral   Diabetes mellitus (HCC) 07/28/2016   Type 2, follows w/ PCP, Dr. Jannell Memo, LOV 01/28/22 (as of 0/15/24.) HgbA1c 6.8 on 01/28/22.   Dyslipidemia    Follows w/ PCP.   Ejection fraction    EF 55-60%, echo, October, 2012   GERD (gastroesophageal reflux disease)    Hiatal hernia    found during EGD   Idiopathic neuropathy    feet   NASH (nonalcoholic steatohepatitis)    chronic, follows w/ gastro, lov 12/14/21 in Epic   Wears glasses     Past Surgical History:  Procedure Laterality Date   CHOLECYSTECTOMY  2008   COLONOSCOPY  06/10/2019   multiple colon polyps   CYSTOSCOPY N/A 04/20/2022   Procedure: CYSTOSCOPY;  Surgeon: Arma Lamp, MD;  Location: [facility];  Service: Gynecology;  Laterality: N/A;   ESOPHAGOGASTRODUODENOSCOPY     around 2018  EYE SURGERY Bilateral 07/15/2021   eye lids   LIVER BIOPSY  2005   LIVER BIOPSY  2019   steatohepatitis & moderate lymphoplasmacytic infiltrate & stage 2 -3 fibrosis   NASAL SINUS SURGERY     around 2001   [facility]OCELE [facility]PA[facility]  2014   [facility]   ROBOTIC ASSISTED LAPAROSCOPIC SACROCOLPOPEXY N/A 04/20/2022   Procedure: XI ROBOTIC ASSISTED LAPAROSCOPIC SACROCOLPOPEXY;  Surgeon: Arma Lamp, MD;  Location: [facility];  Service: Gynecology;  Laterality: N/A;   VAGINAL HYSTERECTOMY  1999    Social History   Socioeconomic History   Marital status: Married    Spouse name: Not on file   Number  of children: Not on file   Years of education: Not on file   Highest education level: Associate degree: occupational, Scientist, product/process development, or vocational program  Occupational History   Not on file  Tobacco Use   Smoking status: Former    Current packs/day: 0.00    Average packs/day: 1 pack/day for 10.0 years (10.0 ttl pk-yrs)    Types: Cigarettes    Start date: 70    Quit date: 72    Years since quitting: 37.4   Smokeless tobacco: Never  Vaping Use   Vaping status: Never Used  Substance and Sexual Activity   Alcohol use: Not Currently   Drug use: No   Sexual activity: Yes    Birth control/protection: Surgical  Other Topics Concern   Not on file  Social History Narrative   Patient resides with her spouse.  She has a son and a daughter, both of whom live within 1 hour of her home.  She also has pets.  No fall risks however.  They are well-behaved.  She retired in June 2023.   Social Drivers of Corporate investment banker Strain: Low Risk  (05/30/2023)   Overall Financial Resource Strain (CARDIA)    Difficulty of Paying Living Expenses: Not very hard  Food Insecurity: No Food Insecurity (05/30/2023)   Hunger Vital Sign    Worried About Running Out of Food in the Last Year: Never true    Ran Out of Food in the Last Year: Never true  Transportation Needs: No Transportation Needs (05/30/2023)   PRAPARE - Administrator, Civil Service (Medical): No    Lack of Transportation (Non-Medical): No  Physical Activity: Insufficiently Active (05/30/2023)   Exercise Vital Sign    Days of Exercise per Week: 2 days    Minutes of Exercise per Session: 20 min  Stress: No Stress Concern Present (05/30/2023)   [employer] of Occupational Health - Occupational Stress Questionnaire    Feeling of Stress : Not at all  Social Connections: Socially Integrated (05/30/2023)   Social Connection and Isolation Panel [NHANES]    Frequency of Communication with Friends and Family: More than three  times a week    Frequency of Social Gatherings with Friends and Family: Twice a week    Attends Religious Services: More than 4 times per year    Active Member of [employer] or Organizations: Yes    Attends Banker Meetings: More than 4 times per year    Marital Status: Married  Catering manager Violence: Not At Risk (05/05/2023)   Humiliation, Afraid, Rape, and Kick questionnaire    Fear of Current or Ex-Partner: No    Emotionally Abused: No    Physically Abused: No    Sexually Abused: No    Outpatient  Encounter Medications as of 08/24/2023  Medication Sig   atorvastatin  (LIPITOR) 10 MG tablet Take 1 tablet (10 mg total) by mouth daily.   blood glucose meter kit and supplies KIT Dispense based on patient and insurance preference. Use up to four times daily as directed. (FOR ICD-9 250.00, 250.01). E11.9   cetirizine  (ZYRTEC ) 10 MG tablet Take by mouth as needed.   Cholecalciferol (VITAMIN D-3 PO) Take 1 tablet by mouth daily.   ciclopirox  (PENLAC ) 8 % solution Apply topically at bedtime. Apply over nail and surrounding skin. Apply daily over previous coat. After seven (7) days, may remove with alcohol and continue cycle.   fluorouracil  (EFUDEX ) 5 % cream Apply topically 2 (two) times daily. X2-4 weeks   fluticasone  (FLONASE ) 50 MCG/ACT nasal spray Place 2 sprays into both nostrils daily.   levocetirizine (XYZAL) 5 MG tablet Take 1 tablet (5 mg total) by mouth every evening.   mycophenolate (MYFORTIC) 360 MG TBEC EC tablet Take 360 mg by mouth 2 (two) times daily.   pantoprazole  (PROTONIX ) 40 MG tablet Take 1 tablet (40 mg total) by mouth daily.   predniSONE  (DELTASONE ) 5 MG tablet Take 1 tablet (5 mg total) by mouth daily with breakfast.   tirzepatide (MOUNJARO) 2.5 MG/0.5ML Pen Inject 2.5 mg into the skin once a week.   tirzepatide (MOUNJARO) 5 MG/0.5ML Pen Inject 5 mg into the skin once a week. Month #2   triamcinolone  cream (KENALOG ) 0.1 % Apply 1 Application topically 2 (two)  times daily as needed (eczema flare for up to 7 days).   [DISCONTINUED] fluticasone  (FLONASE ) 50 MCG/ACT nasal spray Place 1 spray into both nostrils 2 (two) times daily as needed for allergies or rhinitis.   No facility-administered encounter medications on file as of 08/24/2023.    Allergies  Allergen Reactions   Fenofibrate Micronized Other (See Comments)    Caused increased LFT's  Caused increased LFT's    Caused increased LFT's, Caused increased LFT's, Caused increased LFT's, Caused increased LFT's   Glipizide      Hypoglycemia    Hydrocodone Nausea And Vomiting and Other (See Comments)    Makes body hot.  Makes body hot.     "Makes body hot"    Makes body hot. , "Makes body hot"   Codeine  Nausea And Vomiting   Farxiga [Dapagliflozin]     Bladder pains   Janumet  [Sitagliptin  Phos-Metformin  Hcl]     Extremely bad gas    Tape     Can cause skin to tear    Pertinent ROS per HPI, otherwise unremarkable      Objective:  BP (!) 152/75   Pulse 71   Temp (!) 97.3 F (36.3 C)   Ht 5\' 5"  (1.651 m)   Wt 197 lb (89.4 kg)   SpO2 97%   BMI 32.78 kg/m    Wt Readings from Last 3 Encounters:  08/24/23 197 lb (89.4 kg)  06/14/23 200 lb 6.4 oz (90.9 kg)  06/05/23 201 lb 9.6 oz (91.4 kg)    Physical Exam Vitals and nursing note reviewed.  Constitutional:      General: She is not in acute distress.    Appearance: Normal appearance. She is obese. She is not ill-appearing, toxic-appearing or diaphoretic.  HENT:     Head: Normocephalic and atraumatic.     Right Ear: A middle ear effusion is present. Tympanic membrane is not erythematous.     Left Ear: A middle ear effusion is present. Tympanic membrane is not erythematous.  Nose: No congestion or rhinorrhea.     Mouth/Throat:     Mouth: Mucous membranes are moist.     Pharynx: Postnasal drip present.     Comments: Cobblestoning to posterior oropharynx Eyes:     General: Lids are normal.     Conjunctiva/sclera:  Conjunctivae normal.     Pupils: Pupils are equal, round, and reactive to light.  Neck:     Trachea: Trachea and phonation normal.  Cardiovascular:     Rate and Rhythm: Normal rate and regular rhythm.     Heart sounds: Normal heart sounds.  Pulmonary:     Effort: Pulmonary effort is normal.     Breath sounds: Normal breath sounds.  Musculoskeletal:     Cervical back: Full passive range of motion without pain and neck supple.     Right lower leg: No edema.     Left lower leg: No edema.  Skin:    General: Skin is warm and dry.     Capillary Refill: Capillary refill takes less than 2 seconds.  Neurological:     General: No focal deficit present.     Mental Status: She is alert and oriented to person, place, and time.  Psychiatric:        Mood and Affect: Mood normal.        Behavior: Behavior normal. Behavior is cooperative.        Thought Content: Thought content normal.        Judgment: Judgment normal.    Physical Exam   HEENT: Fluid buildup behind ears with improper drainage of eustachian tubes. Cobblestoning in the back of the throat.        Results for orders placed or performed in visit on 05/30/23  Bayer DCA Hb A1c Waived   Collection Time: 05/30/23  8:18 AM  Result Value Ref Range   HB A1C (BAYER DCA - WAIVED) 8.0 (H) 4.8 - 5.6 %  CBC   Collection Time: 05/30/23  8:20 AM  Result Value Ref Range   WBC 6.1 3.4 - 10.8 x10E3/uL   RBC 4.44 3.77 - 5.28 x10E6/uL   Hemoglobin 14.1 11.1 - 15.9 g/dL   Hematocrit 40.9 81.1 - 46.6 %   MCV 96 79 - 97 fL   MCH 31.8 26.6 - 33.0 pg   MCHC 33.3 31.5 - 35.7 g/dL   RDW 91.4 78.2 - 95.6 %   Platelets 165 150 - 450 x10E3/uL  Hepatic function panel   Collection Time: 05/30/23  8:20 AM  Result Value Ref Range   Total Protein 6.9 6.0 - 8.5 g/dL   Albumin 4.4 3.9 - 4.9 g/dL   Bilirubin Total 0.5 0.0 - 1.2 mg/dL   Bilirubin, Direct 2.13 0.00 - 0.40 mg/dL   Alkaline Phosphatase 56 44 - 121 IU/L   AST 22 0 - 40 IU/L   ALT 28 0 -  32 IU/L  IGG   Collection Time: 05/30/23  8:20 AM  Result Value Ref Range   IgG (Immunoglobin G), Serum 1,352 586 - 1,602 mg/dL       Pertinent labs & imaging results that were available during my care of the patient were reviewed by me and considered in my medical decision making.  Assessment & Plan:  Maeleigh was seen today for ear pressure  and labs only.  Diagnoses and all orders for this visit:  Tick bite of right back wall of thorax, initial encounter -     Alpha-Gal Panel -  Spotted Fever Group Antibodies -     Lyme Disease Serology w/Reflex  Fluid level behind tympanic membrane of both ears -     levocetirizine (XYZAL) 5 MG tablet; Take 1 tablet (5 mg total) by mouth every evening. -     fluticasone  (FLONASE ) 50 MCG/ACT nasal spray; Place 2 sprays into both nostrils daily.  Acute joint pain -     Alpha-Gal Panel -     Spotted Fever Group Antibodies -     Lyme Disease Serology w/Reflex      Joint pain post tick bite Joint pain in knees, hands, and shoulders, onset approximately two weeks after tick bite. Differential diagnosis includes tick-borne illnesses such as [facility] spotted fever, Lyme disease, and alpha-gal syndrome. No fever or rash reported post tick bite. - Order blood tests for [facility] spotted fever, Lyme disease, and alpha-gal syndrome. - Notify if tick-borne illness panels are positive.  Allergic rhinitis with eustachian tube dysfunction Intermittent ear fullness and tinnitus for several weeks, likely due to eustachian tube dysfunction associated with allergic rhinitis. Fluid buildup observed behind the ears, with cobblestoning in the throat indicating postnasal drip and allergic rhinitis. Current antihistamine (Zyrtec ) may have lost efficacy due to tolerance. - Discontinue Zyrtec  and start Xyzal, an alternative antihistamine, available over the counter. - Use Flonase  intranasally every morning for two weeks to reduce swelling and  inflammation. - Send prescriptions to [facility] for potential coverage. - Monitor symptoms for improvement over the next week and report if no improvement.     Continue all other maintenance medications.  Follow up plan: Return if symptoms worsen or fail to improve.   Continue healthy lifestyle choices, including diet (rich in fruits, vegetables, and lean proteins, and low in salt and simple carbohydrates) and exercise (at least 30 minutes of moderate physical activity daily).  Educational handout given for OM with effusion   The above assessment and management plan was discussed with the patient. The patient verbalized understanding of and has agreed to the management plan. Patient is aware to call the clinic if they develop any new symptoms or if symptoms persist or worsen. Patient is aware when to return to the clinic for a follow-up visit. Patient educated on when it is appropriate to go to the emergency department.   Kattie Parrot, FNP-C [facility] [phone]

## 2023-08-26 LAB — SPOTTED FEVER GROUP ANTIBODIES
Spotted Fever Group IgG: <1:64 {titer}
Spotted Fever Group IgM: <1:64 {titer}

## 2023-08-26 LAB — ALPHA-GAL PANEL
Allergen Lamb IgE: 5.76 kU/L — AB
Beef IgE: 13.2 kU/L — AB
IgE (Immunoglobulin E), Serum: 175 [IU]/mL (ref 6–495)
O215-IgE Alpha-Gal: 20.2 kU/L — AB
Pork IgE: 4.09 kU/L — AB

## 2023-08-26 LAB — LYME DISEASE SEROLOGY W/REFLEX: Lyme Total Antibody EIA: NEGATIVE

## 2023-08-28 ENCOUNTER — Ambulatory Visit: Payer: Self-pay | Admitting: Family Medicine

## 2023-08-31 ENCOUNTER — Ambulatory Visit: Payer: Medicare Other | Admitting: Cardiology

## 2023-09-04 ENCOUNTER — Encounter: Payer: Self-pay | Admitting: Pharmacist

## 2023-09-04 NOTE — Progress Notes (Signed)
 Patient was identified as falling into the [place] Measure - Diabetes.   Patient was: Appointment already scheduled for:  09/06/23 with PCP. Last A1C 8.0% on 05/30/23. She has since started on Mounjaro which was increased to 5 mg once weekly. Suspect A1C will be much improved at upcoming PCP appointment this week.  Georga Killings, PharmD PGY-1 Pharmacy Resident

## 2023-09-06 ENCOUNTER — Encounter: Payer: Self-pay | Admitting: Family Medicine

## 2023-09-06 ENCOUNTER — Other Ambulatory Visit (HOSPITAL_COMMUNITY)

## 2023-09-06 ENCOUNTER — Ambulatory Visit: Payer: Medicare Other | Admitting: Family Medicine

## 2023-09-06 VITALS — BP 124/75 | HR 88 | Temp 97.9°F | Ht 65.0 in | Wt 194.0 lb

## 2023-09-06 DIAGNOSIS — S91352A Open bite, left foot, initial encounter: Secondary | ICD-10-CM

## 2023-09-06 DIAGNOSIS — E785 Hyperlipidemia, unspecified: Secondary | ICD-10-CM

## 2023-09-06 DIAGNOSIS — W540XXA Bitten by dog, initial encounter: Secondary | ICD-10-CM

## 2023-09-06 DIAGNOSIS — Z7985 Long-term (current) use of injectable non-insulin antidiabetic drugs: Secondary | ICD-10-CM

## 2023-09-06 DIAGNOSIS — E1169 Type 2 diabetes mellitus with other specified complication: Secondary | ICD-10-CM

## 2023-09-06 DIAGNOSIS — K754 Autoimmune hepatitis: Secondary | ICD-10-CM

## 2023-09-06 DIAGNOSIS — I152 Hypertension secondary to endocrine disorders: Secondary | ICD-10-CM

## 2023-09-06 DIAGNOSIS — E119 Type 2 diabetes mellitus without complications: Secondary | ICD-10-CM

## 2023-09-06 DIAGNOSIS — E1159 Type 2 diabetes mellitus with other circulatory complications: Secondary | ICD-10-CM

## 2023-09-06 DIAGNOSIS — E270 Other adrenocortical overactivity: Secondary | ICD-10-CM | POA: Diagnosis not present

## 2023-09-06 DIAGNOSIS — Z91018 Allergy to other foods: Secondary | ICD-10-CM

## 2023-09-06 LAB — HM MAMMOGRAPHY

## 2023-09-06 LAB — LIPID PANEL

## 2023-09-06 LAB — BAYER DCA HB A1C WAIVED: HB A1C (BAYER DCA - WAIVED): 6.7 % — ABNORMAL HIGH (ref 4.8–5.6)

## 2023-09-06 MED ORDER — TIRZEPATIDE 2.5 MG/0.5ML ~~LOC~~ SOAJ: 2.5000 mg | SUBCUTANEOUS | 4 refills | Status: DC

## 2023-09-06 MED ORDER — FLUCONAZOLE 150 MG PO TABS: 150.0000 mg | ORAL_TABLET | Freq: Once | ORAL | 0 refills | Status: AC

## 2023-09-06 MED ORDER — ONDANSETRON 4 MG PO TBDP: 4.0000 mg | ORAL_TABLET | Freq: Three times a day (TID) | ORAL | 0 refills | Status: DC | PRN

## 2023-09-06 MED ORDER — AMOXICILLIN-POT CLAVULANATE 875-125 MG PO TABS: 1.0000 | ORAL_TABLET | Freq: Two times a day (BID) | ORAL | 0 refills | Status: DC

## 2023-09-06 NOTE — Progress Notes (Signed)
 Subjective: CC:DM PCP: Eliodoro Guerin, DO ZOX:WRUE Taylor Leach is a 68 y.o. female presenting to clinic today for:  1. Type 2 Diabetes with hypertension, hyperlipidemia w/ autoimmune hepatitis:  Patient reports compliance with her Mounjaro 2.5 mg weekly.  She did try to go up to the 5 mg but just did not feel well on it so she de-escalated dose.  Needs refills on this.  Denies any hypoglycemic episodes.  She has had a couple of blood sugars in the 200s that were postprandial but otherwise most blood sugars tend to run between 90 and 1 10 in the morning.  She does report occasional nausea and would like to have something on hand for this.  She notes that her prednisone  was recently held in anticipation of getting some labs with her endocrinologist for adrenal insufficiency.  She subsequently has experienced more joint pain   Diabetes Health Maintenance Due  Topic Date Due   OPHTHALMOLOGY EXAM  10/17/2023   FOOT EXAM  11/09/2023   HEMOGLOBIN A1C  11/27/2023    Last A1c:  Lab Results  Component Value Date   HGBA1C 8.0 (H) 05/30/2023    ROS: No chest pain, shortness of breath or falls.  2.  Dog bite She reports about 2 nights ago she sustained a dog bite to her left heel.  She suffers from restless leg and they had a little dog in their bed and she think she excellently kicked it and he bit her.  It did puncture the skin.  She reports some tenderness and redness at that area but reports no fevers or purulence.  She has been trying keep it covered   ROS: Per HPI  Allergies  Allergen Reactions   Fenofibrate Micronized Other (See Comments)    Caused increased LFT's  Caused increased LFT's    Caused increased LFT's, Caused increased LFT's, Caused increased LFT's, Caused increased LFT's   Glipizide      Hypoglycemia    Hydrocodone Nausea And Vomiting and Other (See Comments)    Makes body hot.  Makes body hot.     "Makes body hot"    Makes body hot. , "Makes body hot"    Codeine  Nausea And Vomiting   Farxiga [Dapagliflozin]     Bladder pains   Janumet  [Sitagliptin  Phos-Metformin  Hcl]     Extremely bad gas    Tape     Can cause skin to tear   Past Medical History:  Diagnosis Date   Arthritis    hands   Atypical chest pain 04/14/2022   See cardiology OV note from 04/14/22 in Epic. Atypical chest pain thought to be related to a hiatal hernia.   Autoimmune hepatitis (HCC)    Follows w/ gastroenterology. LOV 12/14/21 with Haskell Linker, AGNP (as of 04/18/22) in Epic.   Chest pain    Stress echo normal, October, 2012   COVID-19 03/2021   flu like symptoms, patient was prescribed an antiviral   Diabetes mellitus (HCC) 07/28/2016   Type 2, follows w/ PCP, Dr. Jannell Memo, LOV 01/28/22 (as of 0/15/24.) HgbA1c 6.8 on 01/28/22.   Dyslipidemia    Follows w/ PCP.   Ejection fraction    EF 55-60%, echo, October, 2012   GERD (gastroesophageal reflux disease)    Hiatal hernia    found during EGD   Idiopathic neuropathy    feet   NASH (nonalcoholic steatohepatitis)    chronic, follows w/ gastro, lov 12/14/21 in Epic   Wears glasses  Current Outpatient Medications:    atorvastatin  (LIPITOR) 10 MG tablet, Take 1 tablet (10 mg total) by mouth daily., Disp: 90 tablet, Rfl: 3   blood glucose meter kit and supplies KIT, Dispense based on patient and insurance preference. Use up to four times daily as directed. (FOR ICD-9 250.00, 250.01). E11.9, Disp: 1 each, Rfl: 0   cetirizine  (ZYRTEC ) 10 MG tablet, Take by mouth as needed., Disp: , Rfl:    Cholecalciferol (VITAMIN D-3 PO), Take 1 tablet by mouth daily., Disp: , Rfl:    ciclopirox  (PENLAC ) 8 % solution, Apply topically at bedtime. Apply over nail and surrounding skin. Apply daily over previous coat. After seven (7) days, may remove with alcohol and continue cycle., Disp: 6.6 mL, Rfl: 1   fluorouracil  (EFUDEX ) 5 % cream, Apply topically 2 (two) times daily. X2-4 weeks, Disp: 40 g, Rfl: 1   fluticasone   (FLONASE ) 50 MCG/ACT nasal spray, Place 2 sprays into both nostrils daily., Disp: 16 g, Rfl: 6   levocetirizine (XYZAL ) 5 MG tablet, Take 1 tablet (5 mg total) by mouth every evening., Disp: 90 tablet, Rfl: 1   mycophenolate (MYFORTIC) 360 MG TBEC EC tablet, Take 360 mg by mouth 2 (two) times daily., Disp: , Rfl:    pantoprazole  (PROTONIX ) 40 MG tablet, Take 1 tablet (40 mg total) by mouth daily., Disp: 90 tablet, Rfl: 3   predniSONE  (DELTASONE ) 5 MG tablet, Take 1 tablet (5 mg total) by mouth daily with breakfast., Disp: 90 tablet, Rfl: 1   tirzepatide (MOUNJARO) 2.5 MG/0.5ML Pen, Inject 2.5 mg into the skin once a week., Disp: 6 mL, Rfl: 0   tirzepatide (MOUNJARO) 5 MG/0.5ML Pen, Inject 5 mg into the skin once a week. Month #2, Disp: 6 mL, Rfl: 0   triamcinolone  cream (KENALOG ) 0.1 %, Apply 1 Application topically 2 (two) times daily as needed (eczema flare for up to 7 days)., Disp: 80 g, Rfl: 1 Social History   Socioeconomic History   Marital status: Married    Spouse name: Not on file   Number of children: Not on file   Years of education: Not on file   Highest education level: Associate degree: occupational, Scientist, product/process development, or vocational program  Occupational History   Not on file  Tobacco Use   Smoking status: Former    Current packs/day: 0.00    Average packs/day: 1 pack/day for 10.0 years (10.0 ttl pk-yrs)    Types: Cigarettes    Start date: 18    Quit date: 39    Years since quitting: 37.4   Smokeless tobacco: Never  Vaping Use   Vaping status: Never Used  Substance and Sexual Activity   Alcohol use: Not Currently   Drug use: No   Sexual activity: Yes    Birth control/protection: Surgical  Other Topics Concern   Not on file  Social History Narrative   Patient resides with her spouse.  She has a son and a daughter, both of whom live within 1 hour of her home.  She also has pets.  No fall risks however.  They are well-behaved.  She retired in June 2023.   Social Drivers  of Corporate investment banker Strain: Low Risk  (05/30/2023)   Overall Financial Resource Strain (CARDIA)    Difficulty of Paying Living Expenses: Not very hard  Food Insecurity: No Food Insecurity (05/30/2023)   Hunger Vital Sign    Worried About Running Out of Food in the Last Year: Never true  Ran Out of Food in the Last Year: Never true  Transportation Needs: No Transportation Needs (05/30/2023)   PRAPARE - Administrator, Civil Service (Medical): No    Lack of Transportation (Non-Medical): No  Physical Activity: Insufficiently Active (05/30/2023)   Exercise Vital Sign    Days of Exercise per Week: 2 days    Minutes of Exercise per Session: 20 min  Stress: No Stress Concern Present (05/30/2023)   [employer] of Occupational Health - Occupational Stress Questionnaire    Feeling of Stress : Not at all  Social Connections: Socially Integrated (05/30/2023)   Social Connection and Isolation Panel [NHANES]    Frequency of Communication with Friends and Family: More than three times a week    Frequency of Social Gatherings with Friends and Family: Twice a week    Attends Religious Services: More than 4 times per year    Active Member of [employer] or Organizations: Yes    Attends Engineer, structural: More than 4 times per year    Marital Status: Married  Catering manager Violence: Not At Risk (05/05/2023)   Humiliation, Afraid, Rape, and Kick questionnaire    Fear of Current or Ex-Partner: No    Emotionally Abused: No    Physically Abused: No    Sexually Abused: No   Family History  Problem Relation Age of Onset   Thyroid  disease Mother    Diabetes Mother    Cancer Mother    Cancer Father    Lupus Daughter    Arthritis/Rheumatoid Daughter    Diabetes type II Daughter     Objective: Office vital signs reviewed. BP 124/75   Pulse 88   Temp 97.9 F (36.6 C)   Ht 5\' 5"  (1.651 m)   Wt 194 lb (88 kg)   SpO2 97%   BMI 32.28 kg/m   Physical  Examination:  General: Awake, alert, obese, No acute distress HEENT: Sclera white.  Moist mucous membranes Cardio: regular rate and rhythm, S1S2 heard, no murmurs appreciated Pulm: clear to auscultation bilaterally, no wheezes, rhonchi or rales; normal work of breathing on room air Skin: Left medial heel with puncture wound.  No drainage but she has quite a bit of warmth, erythema and soft tissue swelling   Assessment/ Plan: 68 y.o. female   Diabetes mellitus treated with injections of non-insulin medication (HCC) - Plan: Bayer DCA Hb A1c Waived, Microalbumin / creatinine urine ratio, ondansetron  (ZOFRAN -ODT) 4 MG disintegrating tablet, tirzepatide (MOUNJARO) 2.5 MG/0.5ML Pen  Hypertension associated with diabetes (HCC)  Hyperlipidemia associated with type 2 diabetes mellitus (HCC) - Plan: Lipid panel  Autoimmune hepatitis (HCC) - Plan: CBC, IGG, Hepatic Function Panel, CBC with Differential  Hypercortisolemia (HCC) - Plan: ACTH  stimulation, 3 time points  Dog bite of left foot, initial encounter - Plan: amoxicillin -clavulanate (AUGMENTIN ) 875-125 MG tablet, fluconazole  (DIFLUCAN ) 150 MG tablet  Allergy to alpha-gal   A1c now at goal.  I have renewed her Mounjaro 2.5 mg weekly.  Urine microalbumin collected.  Zofran  prescribed for as needed use  Blood pressure controlled.  No changes  Lipid panel collected.  Check CBC, IgG and hepatic function panel.  Will send this to her hepatologist once resulted  She had labs collected for her endocrinologist but I did not discuss in detail her hypercortisolemia  I have sent her in Augmentin  for the left foot dog bite.  Home care was reviewed and reasons for reevaluation discussed.  Handout provided  I have added allergy to alpha  gal to her current list of problems.  This is stable as she is avoiding offending foods  Eliodoro Guerin, DO [facility] [phone]

## 2023-09-06 NOTE — Patient Instructions (Signed)
Animal Bite, Adult Animal bites can be mild or serious. Small bites from house pets normally are mild. Bites from cats, strays, or wild animals can be serious. If a stray or wild animal bites you, you need to get medical help right away. You may also need a shot to prevent rabies infection. What increases the risk? Being near pets you do not know. Being near animals that are eating, sleeping, or caring for their babies. Being outside where small, wild animals move freely. What are the signs or symptoms? Pain. Bleeding. Swelling. Bruising. How is this treated? Treatment may include: Cleaning your wound. Rinsing out (flushing) your wound. This uses saline solution, which is made of salt and water. Putting a bandage on your wound. Closing your wound with stitches (sutures), staples, skin glue, or skin tape (adhesive strips). Antibiotic medicine. You may be given pills, cream, gel, or fluid through an IV. A tetanus shot. Rabies treatment, if the animal could have rabies. Surgery, if there is infection or damage that needs to be fixed. Follow these instructions at home: Medicines Take or apply over-the-counter and prescription medicines only as told by your doctor. If you were prescribed an antibiotic medicine, take or apply it as told by your doctor. Do not stop using it even if your wound gets better. Wound care  Follow instructions from your doctor about how to take care of your wound. Make sure you: Wash your hands with soap and water for at least 20 seconds before and after you change your bandage. If you cannot use soap and water, use hand sanitizer. Change your bandage. Leave stitches or skin glue in place for at least 2 weeks. Leave tape strips alone unless you are told to take them off. You may trim the edges of the tape strips if they curl up. Check your wound every day for signs of infection. Check for: More redness, swelling, or pain. More fluid or blood. Warmth. Pus or a  bad smell. General instructions  Raise (elevate) the injured area above the level of your heart while you are sitting or lying down. If told, put ice on the injured area. To do this: Put ice in a plastic bag. Place a towel between your skin and the bag. Leave the ice on for 20 minutes, 2-3 times per day. Take off the ice if your skin turns bright red. This is very important. If you cannot feel pain, heat, or cold, you have a greater risk of damage to the area. Keep all follow-up visits. Contact a doctor if: You have more redness, swelling, or pain around your wound. Your wound feels warm to the touch. You have a fever or chills. You have a general feeling of sickness (malaise). You feel like you may vomit. You vomit. You have pain that does not get better. Get help right away if: You have a red streak going away from your wound. You have any of these coming from your wound: Non-clear fluid. More blood. Pus or a bad smell. You have trouble moving your injured area. You lose feeling (have numbness) or feel tingling anywhere on your body. Summary Animal bites can be mild or serious. If a stray or wild animal bites you, you need to get medical help right away. Your doctor will look at the wound and may ask about how the animal bite happened. Treatment may include wound care, antibiotic medicine, a tetanus shot, and rabies treatment. This information is not intended to replace advice given to you   by your health care provider. Make sure you discuss any questions you have with your health care provider. Document Revised: 03/26/2021 Document Reviewed: 03/26/2021 Elsevier Patient Education  2024 [employer]  

## 2023-09-07 ENCOUNTER — Other Ambulatory Visit: Payer: Self-pay | Admitting: Family Medicine

## 2023-09-07 DIAGNOSIS — Z7985 Long-term (current) use of injectable non-insulin antidiabetic drugs: Secondary | ICD-10-CM

## 2023-09-07 LAB — COMPREHENSIVE METABOLIC PANEL WITH GFR
ALT: 26 IU/L (ref 0–32)
AST: 21 IU/L (ref 0–40)
Albumin: 4 g/dL (ref 3.9–4.9)
Alkaline Phosphatase: 71 IU/L (ref 44–121)
BUN/Creatinine Ratio: 10 — ABNORMAL LOW (ref 12–28)
BUN: 10 mg/dL (ref 8–27)
Bilirubin Total: 0.9 mg/dL (ref 0.0–1.2)
CO2: 20 mmol/L (ref 20–29)
Calcium: 9.5 mg/dL (ref 8.7–10.3)
Chloride: 104 mmol/L (ref 96–106)
Creatinine, Ser: 1.02 mg/dL — ABNORMAL HIGH (ref 0.57–1.00)
Globulin, Total: 3.1 g/dL (ref 1.5–4.5)
Glucose: 140 mg/dL — ABNORMAL HIGH (ref 70–99)
Potassium: 4.1 mmol/L (ref 3.5–5.2)
Sodium: 141 mmol/L (ref 134–144)
Total Protein: 7.1 g/dL (ref 6.0–8.5)
eGFR: 60 mL/min/{1.73_m2} (ref >59)

## 2023-09-07 LAB — MICROALBUMIN / CREATININE URINE RATIO
Creatinine, Urine: 421 mg/dL
Microalb/Creat Ratio: 11 mg/g{creat} (ref 0–29)
Microalbumin, Urine: 46.1 ug/mL

## 2023-09-07 LAB — CORTISOL-AM, BLOOD: Cortisol - AM: 14 ug/dL (ref 6.2–19.4)

## 2023-09-07 NOTE — Telephone Encounter (Signed)
 ondansetron  (ZOFRAN -ODT) 4 MG disintegrating tablet   Pharmacy comment: Please verify the quantity to dispense. Our filling sites are only able to provide this product in quantities of [30 TABLETS], which is more than the total quantity prescribed of 20 tablets. Yes, ok to dispense 30 tablets to the patient.

## 2023-09-11 ENCOUNTER — Ambulatory Visit: Payer: Self-pay | Admitting: Family Medicine

## 2023-09-12 ENCOUNTER — Other Ambulatory Visit (HOSPITAL_COMMUNITY)
Admission: RE | Admit: 2023-09-12 | Discharge: 2023-09-12 | Disposition: A | Discharge status: Home / Self Care | Source: Ambulatory Visit | Attending: Oncology | Admitting: Oncology

## 2023-09-12 ENCOUNTER — Encounter (INDEPENDENT_AMBULATORY_CARE_PROVIDER_SITE_OTHER): Payer: Self-pay | Admitting: Family Medicine

## 2023-09-12 DIAGNOSIS — R519 Headache, unspecified: Secondary | ICD-10-CM | POA: Diagnosis not present

## 2023-09-12 DIAGNOSIS — Z006 Encounter for examination for normal comparison and control in clinical research program: Secondary | ICD-10-CM | POA: Insufficient documentation

## 2023-09-12 DIAGNOSIS — G4734 Idiopathic sleep related nonobstructive alveolar hypoventilation: Secondary | ICD-10-CM

## 2023-09-12 DIAGNOSIS — R0683 Snoring: Secondary | ICD-10-CM

## 2023-09-13 NOTE — Telephone Encounter (Signed)

## 2023-09-14 ENCOUNTER — Encounter: Payer: Self-pay | Admitting: Family Medicine

## 2023-09-23 LAB — GENECONNECT MOLECULAR SCREEN: Genetic Analysis Overall Interpretation: NEGATIVE

## 2023-09-25 ENCOUNTER — Encounter: Payer: Self-pay | Admitting: "Endocrinology

## 2023-09-25 ENCOUNTER — Ambulatory Visit (INDEPENDENT_AMBULATORY_CARE_PROVIDER_SITE_OTHER): Admitting: "Endocrinology

## 2023-09-25 VITALS — BP 132/74 | HR 76 | Ht 65.0 in | Wt 197.4 lb

## 2023-09-25 DIAGNOSIS — E2740 Unspecified adrenocortical insufficiency: Secondary | ICD-10-CM

## 2023-09-25 NOTE — Progress Notes (Signed)
 09/25/2023, 1:13 PM   Endocrinology follow-up note  Subjective:    Patient ID: Taylor Leach, female    DOB: 18-Apr-1955, PCP [name], DO   Past Medical History:  Diagnosis Date   Arthritis    hands   Atypical chest pain 04/14/2022   See cardiology OV note from 04/14/22 in Epic. Atypical chest pain thought to be related to a hiatal hernia.   Autoimmune hepatitis (HCC)    Follows w/ gastroenterology. LOV 12/14/21 with [name], AGNP (as of 04/18/22) in Epic.   Chest pain    Stress echo normal, October, 2012   COVID-19 03/2021   flu like symptoms, patient was prescribed an antiviral   Diabetes mellitus (HCC) 07/28/2016   Type 2, follows w/ PCP, Dr. [name], LOV 01/28/22 (as of 0/15/24.) HgbA1c 6.8 on 01/28/22.   Dyslipidemia    Follows w/ PCP.   Ejection fraction    EF 55-60%, echo, October, 2012   GERD (gastroesophageal reflux disease)    Hiatal hernia    found during EGD   Idiopathic neuropathy    feet   NASH (nonalcoholic steatohepatitis)    chronic, follows w/ gastro, lov 12/14/21 in Epic   Wears glasses    Past Surgical History:  Procedure Laterality Date   CHOLECYSTECTOMY  2008   COLONOSCOPY  06/10/2019   multiple colon polyps   CYSTOSCOPY N/A 04/20/2022   Procedure: CYSTOSCOPY;  Surgeon: Marilynne Rosaline SAILOR, MD;  Location: [facility];  Service: Gynecology;  Laterality: N/A;   ESOPHAGOGASTRODUODENOSCOPY     around 2018   EYE SURGERY Bilateral 07/15/2021   eye lids   LIVER BIOPSY  2005   LIVER BIOPSY  2019   steatohepatitis & moderate lymphoplasmacytic infiltrate & stage 2 -3 fibrosis   NASAL SINUS SURGERY     around 2001   [facility]   ROBOTIC ASSISTED LAPAROSCOPIC SACROCOLPOPEXY N/A 04/20/2022   Procedure: XI ROBOTIC ASSISTED LAPAROSCOPIC SACROCOLPOPEXY;  Surgeon: Marilynne Rosaline SAILOR, MD;  Location: [facility];  Service: Gynecology;  Laterality: N/A;   VAGINAL HYSTERECTOMY  1999   Social History   Socioeconomic History   Marital status: Married    Spouse name: Not on file   Number of children: Not on file   Years of education: Not on file   Highest education level: Associate degree: occupational, Scientist, product/process development, or vocational program  Occupational History   Not on file  Tobacco Use   Smoking status: Former    Current packs/day: 0.00    Average packs/day: 1 pack/day for 10.0 years (10.0 ttl pk-yrs)    Types: Cigarettes    Start date: 46    Quit date: 77    Years since quitting: 37.5   Smokeless tobacco: Never  Vaping Use   Vaping status: Never Used  Substance and Sexual Activity   Alcohol use: Not Currently   Drug use: No   Sexual activity: Yes    Birth control/protection: Surgical  Other Topics Concern   Not on file  Social History Narrative   Patient resides with her spouse.  She has a son and a daughter, both of whom live  within 1 hour of her home.  She also has pets.  No fall risks however.  They are well-behaved.  She retired in June 2023.   Social Drivers of Corporate investment banker Strain: Low Risk  (05/30/2023)   Overall Financial Resource Strain (CARDIA)    Difficulty of Paying Living Expenses: Not very hard  Food Insecurity: No Food Insecurity (05/30/2023)   Hunger Vital Sign    Worried About Running Out of Food in the Last Year: Never true    Ran Out of Food in the Last Year: Never true  Transportation Needs: No Transportation Needs (05/30/2023)   PRAPARE - Administrator, Civil Service (Medical): No    Lack of Transportation (Non-Medical): No  Physical Activity: Insufficiently Active (05/30/2023)   Exercise Vital Sign    Days of Exercise per Week: 2 days    Minutes of Exercise per Session: 20 min  Stress: No Stress Concern Present (05/30/2023)   [employer] of Occupational Health - Occupational Stress Questionnaire    Feeling  of Stress : Not at all  Social Connections: Socially Integrated (05/30/2023)   Social Connection and Isolation Panel    Frequency of Communication with Friends and Family: More than three times a week    Frequency of Social Gatherings with Friends and Family: Twice a week    Attends Religious Services: More than 4 times per year    Active Member of [employer] or Organizations: Yes    Attends Engineer, structural: More than 4 times per year    Marital Status: Married   Family History  Problem Relation Age of Onset   Thyroid  disease Mother    Diabetes Mother    Cancer Mother    Cancer Father    Lupus Daughter    Arthritis/Rheumatoid Daughter    Diabetes type II Daughter    Outpatient Encounter Medications as of 09/25/2023  Medication Sig   amoxicillin -clavulanate (AUGMENTIN ) 875-125 MG tablet Take 1 tablet by mouth 2 (two) times daily. (Patient not taking: Reported on 09/25/2023)   atorvastatin  (LIPITOR) 10 MG tablet Take 1 tablet (10 mg total) by mouth daily.   blood glucose meter kit and supplies KIT Dispense based on patient and insurance preference. Use up to four times daily as directed. (FOR ICD-9 250.00, 250.01). E11.9   cetirizine  (ZYRTEC ) 10 MG tablet Take by mouth as needed.   Cholecalciferol (VITAMIN D-3 PO) Take 1 tablet by mouth daily.   ciclopirox  (PENLAC ) 8 % solution Apply topically at bedtime. Apply over nail and surrounding skin. Apply daily over previous coat. After seven (7) days, may remove with alcohol and continue cycle.   fluorouracil  (EFUDEX ) 5 % cream Apply topically 2 (two) times daily. X2-4 weeks   fluticasone  (FLONASE ) 50 MCG/ACT nasal spray Place 2 sprays into both nostrils daily.   levocetirizine (XYZAL ) 5 MG tablet Take 1 tablet (5 mg total) by mouth every evening.   mycophenolate (MYFORTIC) 360 MG TBEC EC tablet Take 360 mg by mouth 2 (two) times daily.   ondansetron  (ZOFRAN -ODT) 4 MG disintegrating tablet Take 1 tablet (4 mg total) by mouth every 8  (eight) hours as needed for nausea or vomiting.   pantoprazole  (PROTONIX ) 40 MG tablet Take 1 tablet (40 mg total) by mouth daily.   predniSONE  (DELTASONE ) 5 MG tablet Take 1 tablet (5 mg total) by mouth daily with breakfast. (Patient not taking: Reported on 09/25/2023)   tirzepatide  (MOUNJARO ) 2.5 MG/0.5ML Pen Inject 2.5 mg into the skin  once a week.   triamcinolone  cream (KENALOG ) 0.1 % Apply 1 Application topically 2 (two) times daily as needed (eczema flare for up to 7 days).   No facility-administered encounter medications on file as of 09/25/2023.   ALLERGIES: Allergies  Allergen Reactions   Fenofibrate Micronized Other (See Comments)    Caused increased LFT's  Caused increased LFT's    Caused increased LFT's, Caused increased LFT's, Caused increased LFT's, Caused increased LFT's   Glipizide      Hypoglycemia    Hydrocodone Nausea And Vomiting and Other (See Comments)    Makes body hot.  Makes body hot.     Makes body hot    Makes body hot. , Makes body hot   Alpha-Gal    Codeine  Nausea And Vomiting   Farxiga [Dapagliflozin]     Bladder pains   Janumet  [Sitagliptin  Phos-Metformin  Hcl]     Extremely bad gas    Tape     Can cause skin to tear    VACCINATION STATUS: Immunization History  Administered Date(s) Administered   Hep A / Hep B 03/24/2008, 04/23/2008, 09/08/2008   Influenza, Seasonal, Injecte, Preservative Fre 01/10/2008   Influenza,inj,Quad PF,6+ Mos 05/18/2016   Influenza-Unspecified 02/02/2018   PFIZER(Purple Top)SARS-COV-2 Vaccination 11/08/2019, 11/29/2019   PNEUMOCOCCAL CONJUGATE-20 10/29/2021   Pneumococcal Polysaccharide-23 11/30/2017   Zoster Recombinant(Shingrix) 12/12/2018, 06/12/2019    HPI Taylor Leach is 68 y.o. female who presents today with a medical history as above. she is being seen in follow-up after she was seen in consultation for hypercortisolemia requested by [name], DO.   She gives history of exposure to high-dose  steroids related to her autoimmune hepatitis in the past.  Although her exposure ranges between 6 to 7 years Mizera or prednisone  as high as 60 mg daily at times. Due to her labs  ( ACTH  stim test) showing partial primary adrenal insuff , she was started on prednisone  5mg  po daily.  2 weeks after she stopped herself off of prednisone , her a.m. cortisol was 14 mg per DL.  She has no new complaints.   She denies family history of adrenal dysfunction.  Her other medical problems include type 2 diabetes and hyperlipidemia on treatment.  She does not have hypertension.   She was initiated on Mounjaro  by  her PMD. Her other current medications include Myfortic acid, Protonix , budesonide, vitamin D.  Her most recent A1c measurements range between 6-8%. She did not tolerate metformin  in the past.   Review of Systems  Constitutional: +mildly fluctuating body weight ,  + fatigue, no subjective hyperthermia, no subjective hypothermia Eyes: no blurry vision, no xerophthalmia   Objective:       09/25/2023   11:14 AM 09/06/2023    8:18 AM 08/24/2023    8:40 AM  Vitals with BMI  Height 5' 5 5' 5   Weight 197 lbs 6 oz 194 lbs   BMI 32.85 32.28   Systolic 132 124 847  Diastolic 74 75 75  Pulse 76 88     BP 132/74   Pulse 76   Ht 5' 5 (1.651 m)   Wt 197 lb 6.4 oz (89.5 kg)   BMI 32.85 kg/m   Wt Readings from Last 3 Encounters:  09/25/23 197 lb 6.4 oz (89.5 kg)  09/06/23 194 lb (88 kg)  08/24/23 197 lb (89.4 kg)    Physical Exam  Constitutional:  Body mass index is 32.85 kg/m.,  not in acute distress, normal state of mind Eyes:  PERRLA, EOMI, no exophthalmos ENT: moist mucous membranes, no gross thyromegaly, no gross cervical lymphadenopathy   CMP ( most recent) CMP     Component Value Date/Time   NA 141 09/06/2023 0844   K 4.1 09/06/2023 0844   CL 104 09/06/2023 0844   CO2 20 09/06/2023 0844   GLUCOSE 140 (H) 09/06/2023 0844   GLUCOSE 120 (H) 06/06/2022 1848   BUN 10  09/06/2023 0844   CREATININE 1.02 (H) 09/06/2023 0844   CALCIUM  9.5 09/06/2023 0844   PROT 7.2 09/06/2023 0858   ALBUMIN 4.0 09/06/2023 0858   AST 28 09/06/2023 0858   ALT 28 09/06/2023 0858   ALKPHOS 70 09/06/2023 0858   BILITOT 0.9 09/06/2023 0858   EGFR 60 09/06/2023 0844   GFRNONAA >60 06/06/2022 1848     Diabetic Labs (most recent): Lab Results  Component Value Date   HGBA1C 6.7 (H) 09/06/2023   HGBA1C 8.0 (H) 05/30/2023   HGBA1C 7.9 (H) 02/22/2023     Lipid Panel ( most recent) Lipid Panel     Component Value Date/Time   CHOL 135 09/06/2023 0858   TRIG 158 (H) 09/06/2023 0858   HDL 38 (L) 09/06/2023 0858   CHOLHDL 3.6 09/06/2023 0858   CHOLHDL 4.1 10/23/2010 1748   VLDL 40 10/23/2010 1748   LDLCALC 70 09/06/2023 0858   LABVLDL 27 09/06/2023 0858      Lab Results  Component Value Date   TSH 2.440 08/05/2022   TSH 1.747 06/06/2022   TSH 1.990 07/16/2019   TSH 4.050 06/07/2019   TSH 2.640 08/10/2018   FREET4 1.33 08/05/2022      Assessment & Plan:   Hypocortisolism  2.  Type 2 diabetes  3. Hyperlipidemia  - Taylor Leach  is being seen at a kind request of Jolinda Potter M, DO. - I have reviewed her new and available  records and clinically evaluated the patient. 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 daily until 2 weeks before her last blood draw which showed cortisol of 14.   This gives her a chance to come off of prednisone  for now-at least until next measurement.    She has several years of exposure to high-dose steroids orally to treat autoimmune hepatitis.  This seems to be her highest risk for adrenal insufficiency.  -Regarding her diagnosis of type 2 diabetes with A1c ranging between 6-8%.  She is initiated on  Mounjaro  currently 5 mg [place] weekly, advised to  continue.   I discussed dietary approach for her to avoid processed carbs and processed meat . She will have A1c measurement subsequently. Her LDL is controlled at 74. She is on Atorvastatin  10 mg po at bedtime.   - she is advised to maintain close follow up with Jolinda Potter HERO, DO for primary care needs.   I spent  21  minutes in the care of the patient today including review of labs from Thyroid  Function, CMP, and other relevant labs ; imaging/biopsy records (current and previous including abstractions from other facilities); face-to-face time discussing  her lab results and symptoms, medications doses, her options of short and long term treatment based on the latest standards of care / guidelines;   and documenting the encounter.  Taylor Leach  participated in the discussions, expressed understanding, and voiced agreement with the above  plans.  All questions were answered to her satisfaction. she is encouraged to contact clinic should she have any questions or concerns prior to her return visit.    Follow up plan: Return in about 6 months (around 03/26/2024) for Fasting Labs  in AM B4 8.   Ranny Earl, MD [facility] [address] [place], [place] [address] Phone: [phone]  Fax: [phone]     09/25/2023, 1:13 PM  This note was partially dictated with voice recognition software. Similar sounding words can be transcribed inadequately or may not  be corrected upon review.

## 2023-09-28 LAB — ACTH STIMULATION, 3 TIME POINTS

## 2023-09-29 LAB — HEPATIC FUNCTION PANEL
ALT: 28 IU/L (ref 0–32)
AST: 28 IU/L (ref 0–40)
Albumin: 4 g/dL (ref 3.9–4.9)
Alkaline Phosphatase: 70 IU/L (ref 44–121)
Bilirubin Total: 0.9 mg/dL (ref 0.0–1.2)
Bilirubin, Direct: 0.19 mg/dL (ref 0.00–0.40)
Total Protein: 7.2 g/dL (ref 6.0–8.5)

## 2023-09-29 LAB — CBC WITH DIFFERENTIAL/PLATELET
Basophils Absolute: 0 10*3/uL (ref 0.0–0.2)
Basos: 0 %
EOS (ABSOLUTE): 0.1 10*3/uL (ref 0.0–0.4)
Eos: 1 %
Hematocrit: 44.1 % (ref 34.0–46.6)
Hemoglobin: 14.5 g/dL (ref 11.1–15.9)
Immature Grans (Abs): 0 10*3/uL (ref 0.0–0.1)
Immature Granulocytes: 0 %
Lymphocytes Absolute: 1.4 10*3/uL (ref 0.7–3.1)
Lymphs: 18 %
MCH: 32.1 pg (ref 26.6–33.0)
MCHC: 32.9 g/dL (ref 31.5–35.7)
MCV: 98 fL — ABNORMAL HIGH (ref 79–97)
Monocytes Absolute: 0.9 10*3/uL (ref 0.1–0.9)
Monocytes: 11 %
Neutrophils Absolute: 5.5 10*3/uL (ref 1.4–7.0)
Neutrophils: 70 %
Platelets: 201 10*3/uL (ref 150–450)
RBC: 4.52 x10E6/uL (ref 3.77–5.28)
RDW: 11.9 % (ref 11.7–15.4)
WBC: 7.9 10*3/uL (ref 3.4–10.8)

## 2023-09-29 LAB — LIPID PANEL
Chol/HDL Ratio: 3.6 ratio (ref 0.0–4.4)
Cholesterol, Total: 135 mg/dL (ref 100–199)
HDL: 38 mg/dL — ABNORMAL LOW (ref >39)
LDL Chol Calc (NIH): 70 mg/dL (ref 0–99)
Triglycerides: 158 mg/dL — ABNORMAL HIGH (ref 0–149)
VLDL Cholesterol Cal: 27 mg/dL (ref 5–40)

## 2023-09-29 LAB — ACTH STIMULATION, 3 TIME POINTS

## 2023-09-29 LAB — IGG: IgG (Immunoglobin G), Serum: 1403 mg/dL (ref 586–1602)

## 2023-10-13 ENCOUNTER — Telehealth: Payer: Self-pay | Admitting: Family Medicine

## 2023-10-17 LAB — HM DIABETES EYE EXAM

## 2023-10-23 ENCOUNTER — Telehealth (INDEPENDENT_AMBULATORY_CARE_PROVIDER_SITE_OTHER): Admitting: Family Medicine

## 2023-10-23 ENCOUNTER — Encounter: Payer: Self-pay | Admitting: Family Medicine

## 2023-10-23 DIAGNOSIS — R600 Localized edema: Secondary | ICD-10-CM | POA: Diagnosis not present

## 2023-10-23 DIAGNOSIS — M2550 Pain in unspecified joint: Secondary | ICD-10-CM | POA: Diagnosis not present

## 2023-10-23 MED ORDER — FUROSEMIDE 20 MG PO TABS: 20.0000 mg | ORAL_TABLET | Freq: Every day | ORAL | 3 refills | Status: DC | PRN

## 2023-10-23 NOTE — Progress Notes (Signed)
 MyChart Video visit  Subjective: CC: leg swelling PCP: [name], DO Taylor Leach is a 68 y.o. female. Patient provides verbal consent for consult held via video.  Due to COVID-19 pandemic this visit was conducted virtually. This visit type was conducted due to national recommendations for restrictions regarding the COVID-19 Pandemic (e.g. social distancing, sheltering in place) in an effort to limit this patient's exposure and mitigate transmission in our community. All issues noted in this document were discussed and addressed.  A physical exam was not performed with this format.   Location of patient: home Location of provider: [facility] Others present for call: none  1. Leg edema She reports chronic issue that is worsening. She reports UOP increases the night time and fluid goes down typically at night time.  She restricts salt.  Denies SOB, orthopnea. Off prednisone .  She does report some increasing joint pain but is not sure if this is due to coming off of prednisone  or due to leg edema   ROS: Per HPI  Allergies  Allergen Reactions   Fenofibrate Micronized Other (See Comments)    Caused increased LFT's  Caused increased LFT's    Caused increased LFT's, Caused increased LFT's, Caused increased LFT's, Caused increased LFT's   Glipizide      Hypoglycemia    Hydrocodone Nausea And Vomiting and Other (See Comments)    Makes body hot.  Makes body hot.     Makes body hot    Makes body hot. , Makes body hot   Alpha-Gal    Codeine  Nausea And Vomiting   Farxiga [Dapagliflozin]     Bladder pains   Janumet  [Sitagliptin  Phos-Metformin  Hcl]     Extremely bad gas    Tape     Can cause skin to tear   Past Medical History:  Diagnosis Date   Arthritis    hands   Atypical chest pain 04/14/2022   See cardiology OV note from 04/14/22 in Epic. Atypical chest pain thought to be related to a hiatal hernia.   Autoimmune hepatitis (HCC)    Follows w/ gastroenterology. LOV  12/14/21 with [name], AGNP (as of 04/18/22) in Epic.   Chest pain    Stress echo normal, October, 2012   COVID-19 03/2021   flu like symptoms, patient was prescribed an antiviral   Diabetes mellitus (HCC) 07/28/2016   Type 2, follows w/ PCP, Dr. [name], LOV 01/28/22 (as of 0/15/24.) HgbA1c 6.8 on 01/28/22.   Dyslipidemia    Follows w/ PCP.   Ejection fraction    EF 55-60%, echo, October, 2012   GERD (gastroesophageal reflux disease)    Hiatal hernia    found during EGD   Idiopathic neuropathy    feet   NASH (nonalcoholic steatohepatitis)    chronic, follows w/ gastro, lov 12/14/21 in Epic   Wears glasses     Current Outpatient Medications:    amoxicillin -clavulanate (AUGMENTIN ) 875-125 MG tablet, Take 1 tablet by mouth 2 (two) times daily. (Patient not taking: Reported on 09/25/2023), Disp: 20 tablet, Rfl: 0   atorvastatin  (LIPITOR) 10 MG tablet, Take 1 tablet (10 mg total) by mouth daily., Disp: 90 tablet, Rfl: 3   blood glucose meter kit and supplies KIT, Dispense based on patient and insurance preference. Use up to four times daily as directed. (FOR ICD-9 250.00, 250.01). E11.9, Disp: 1 each, Rfl: 0   cetirizine  (ZYRTEC ) 10 MG tablet, Take by mouth as needed., Disp: , Rfl:    Cholecalciferol (VITAMIN D-3 PO),  Take 1 tablet by mouth daily., Disp: , Rfl:    ciclopirox  (PENLAC ) 8 % solution, Apply topically at bedtime. Apply over nail and surrounding skin. Apply daily over previous coat. After seven (7) days, may remove with alcohol and continue cycle., Disp: 6.6 mL, Rfl: 1   fluorouracil  (EFUDEX ) 5 % cream, Apply topically 2 (two) times daily. X2-4 weeks, Disp: 40 g, Rfl: 1   fluticasone  (FLONASE ) 50 MCG/ACT nasal spray, Place 2 sprays into both nostrils daily., Disp: 16 g, Rfl: 6   levocetirizine (XYZAL ) 5 MG tablet, Take 1 tablet (5 mg total) by mouth every evening., Disp: 90 tablet, Rfl: 1   mycophenolate (MYFORTIC) 360 MG TBEC EC tablet, Take 360 mg by mouth 2  (two) times daily., Disp: , Rfl:    ondansetron  (ZOFRAN -ODT) 4 MG disintegrating tablet, Take 1 tablet (4 mg total) by mouth every 8 (eight) hours as needed for nausea or vomiting., Disp: 30 tablet, Rfl: 1   pantoprazole  (PROTONIX ) 40 MG tablet, Take 1 tablet (40 mg total) by mouth daily., Disp: 90 tablet, Rfl: 3   tirzepatide  (MOUNJARO ) 2.5 MG/0.5ML Pen, Inject 2.5 mg into the skin once a week., Disp: 6 mL, Rfl: 4   triamcinolone  cream (KENALOG ) 0.1 %, Apply 1 Application topically 2 (two) times daily as needed (eczema flare for up to 7 days)., Disp: 80 g, Rfl: 1  Gen: Well-appearing female.  No acute distress Pulm: Normal work of breathing on room air.  Assessment/ Plan: 68 y.o. female   Bilateral leg edema - Plan: furosemide  (LASIX ) 20 MG tablet  Polyarthralgia  Trial of Lasix  for leg edema.  Discussed compression hose.  Will do autoimmune workup for polyarthralgia if this is persistent despite improvement in fluid status.  She will contact me in a couple of weeks and let me know  Start time: 11:05a End time: 11:12a  Total time spent on patient care (including video visit/ documentation): 10 minutes  Iszabella Hebenstreit CHRISTELLA Fielding, DO [facility] [phone]

## 2023-10-26 ENCOUNTER — Other Ambulatory Visit: Payer: Self-pay | Admitting: Family Medicine

## 2023-10-26 NOTE — Telephone Encounter (Signed)
 Patient request Epi Pen not on profile Last OV: 10/23/2023

## 2023-10-26 NOTE — Telephone Encounter (Signed)
 Multiple allergies okay to send in?

## 2023-10-26 NOTE — Telephone Encounter (Unsigned)
 Copied from CRM [id number]. Topic: Clinical - Medication Refill >> Oct 26, 2023 10:03 AM [place] B wrote: Medication: Epi Pen  Has the patient contacted their pharmacy? No, Pharmacy called directly in for this request Its not on pt med list, she was just dx with GS syndrome, she doesn't know exactly when dx was made, but says she was dx by Dr Jolinda. Pt isnt having symptoms now, just wants on hand in case pt may need it  This is the patient's preferred pharmacy:  [facility] - [place], [place] - [address] [facility] [place] [address] [place] [address] [place] [address] Phone: [phone] Fax: [phone]   Is this the correct pharmacy for this prescription? yes   Has the prescription been filled recently? no  Is the patient out of the medication? yes  Has the patient been seen for an appointment in the last year OR does the patient have an upcoming appointment? yes  Can we respond through MyChart? yes  Agent: Please be advised that Rx refills may take up to 3 business days. We ask that you follow-up with your pharmacy.

## 2023-10-27 MED ORDER — EPINEPHRINE 0.3 MG/0.3ML IJ SOAJ: 0.3000 mg | INTRAMUSCULAR | 1 refills | Status: AC | PRN

## 2023-11-08 ENCOUNTER — Ambulatory Visit (INDEPENDENT_AMBULATORY_CARE_PROVIDER_SITE_OTHER): Admitting: Family Medicine

## 2023-11-08 ENCOUNTER — Encounter: Payer: Self-pay | Admitting: Family Medicine

## 2023-11-08 VITALS — BP 149/74 | HR 70 | Temp 96.5°F | Ht 65.0 in | Wt 190.4 lb

## 2023-11-08 DIAGNOSIS — S50911A Unspecified superficial injury of right forearm, initial encounter: Secondary | ICD-10-CM | POA: Diagnosis not present

## 2023-11-08 DIAGNOSIS — L089 Local infection of the skin and subcutaneous tissue, unspecified: Secondary | ICD-10-CM | POA: Diagnosis not present

## 2023-11-08 MED ORDER — DOXYCYCLINE HYCLATE 100 MG PO TABS: 100.0000 mg | ORAL_TABLET | Freq: Two times a day (BID) | ORAL | 0 refills | Status: AC

## 2023-11-08 MED ORDER — FLUCONAZOLE 150 MG PO TABS
150.0000 mg | ORAL_TABLET | Freq: Once | ORAL | 0 refills | Status: AC
Start: 2023-11-08 — End: 2023-11-08

## 2023-11-08 NOTE — Progress Notes (Signed)
 Subjective: CC: Skin tear PCP: Leach Leach HERO, DO YEP:Leach Leach is a 68 y.o. female presenting to clinic today for:  1.  Skin tear Patient reports that she tripped over something and ended up tearing the skin along the lateral aspect of her right forearm when she hit the threshold of the door.  It looks a little redder and has been oozing more than at onset.  She has been applying triple antibiotic ointment and keeping nonstick pads on it.  Used honey 1 time as well.  No fevers or chills reported   ROS: Per HPI  Allergies  Allergen Reactions   Fenofibrate Micronized Other (See Comments)    Caused increased LFT's  Caused increased LFT's    Caused increased LFT's, Caused increased LFT's, Caused increased LFT's, Caused increased LFT's   Glipizide      Hypoglycemia    Hydrocodone Nausea And Vomiting and Other (See Comments)    Makes body hot.  Makes body hot.     Makes body hot    Makes body hot. , Makes body hot   Alpha-Gal    Codeine  Nausea And Vomiting   Farxiga [Dapagliflozin]     Bladder pains   Janumet  [Sitagliptin  Phos-Metformin  Hcl]     Extremely bad gas    Tape     Can cause skin to tear   Past Medical History:  Diagnosis Date   Arthritis    hands   Atypical chest pain 04/14/2022   See cardiology OV note from 04/14/22 in Epic. Atypical chest pain thought to be related to a hiatal hernia.   Autoimmune hepatitis (HCC)    Follows w/ gastroenterology. LOV 12/14/21 with [name], AGNP (as of 04/18/22) in Epic.   Chest pain    Stress echo normal, October, 2012   COVID-19 03/2021   flu like symptoms, patient was prescribed an antiviral   Diabetes mellitus (HCC) 07/28/2016   Type 2, follows w/ PCP, Dr. Rosina Leach, LOV 01/28/22 (as of 0/15/24.) HgbA1c 6.8 on 01/28/22.   Dyslipidemia    Follows w/ PCP.   Ejection fraction    EF 55-60%, echo, October, 2012   GERD (gastroesophageal reflux disease)    Hiatal hernia    found during EGD    Idiopathic neuropathy    feet   NASH (nonalcoholic steatohepatitis)    chronic, follows w/ gastro, lov 12/14/21 in Epic   Wears glasses     Current Outpatient Medications:    atorvastatin  (LIPITOR) 10 MG tablet, Take 1 tablet (10 mg total) by mouth daily., Disp: 90 tablet, Rfl: 3   blood glucose meter kit and supplies KIT, Dispense based on patient and insurance preference. Use up to four times daily as directed. (FOR ICD-9 250.00, 250.01). E11.9, Disp: 1 each, Rfl: 0   cetirizine  (ZYRTEC ) 10 MG tablet, Take by mouth as needed., Disp: , Rfl:    Cholecalciferol (VITAMIN D-3 PO), Take 1 tablet by mouth daily., Disp: , Rfl:    ciclopirox  (PENLAC ) 8 % solution, Apply topically at bedtime. Apply over nail and surrounding skin. Apply daily over previous coat. After seven (7) days, may remove with alcohol and continue cycle., Disp: 6.6 mL, Rfl: 1   EPINEPHrine  0.3 mg/0.3 mL IJ SOAJ injection, Inject 0.3 mg into the muscle as needed for anaphylaxis., Disp: 1 each, Rfl: 1   fluorouracil  (EFUDEX ) 5 % cream, Apply topically 2 (two) times daily. X2-4 weeks, Disp: 40 g, Rfl: 1   fluticasone  (FLONASE ) 50 MCG/ACT nasal spray, Place  2 sprays into both nostrils daily., Disp: 16 g, Rfl: 6   furosemide  (LASIX ) 20 MG tablet, Take 1 tablet (20 mg total) by mouth daily as needed for edema or fluid., Disp: 90 tablet, Rfl: 3   levocetirizine (XYZAL ) 5 MG tablet, Take 1 tablet (5 mg total) by mouth every evening., Disp: 90 tablet, Rfl: 1   mycophenolate (MYFORTIC) 360 MG TBEC EC tablet, Take 360 mg by mouth 2 (two) times daily., Disp: , Rfl:    ondansetron  (ZOFRAN -ODT) 4 MG disintegrating tablet, Take 1 tablet (4 mg total) by mouth every 8 (eight) hours as needed for nausea or vomiting., Disp: 30 tablet, Rfl: 1   pantoprazole  (PROTONIX ) 40 MG tablet, Take 1 tablet (40 mg total) by mouth daily., Disp: 90 tablet, Rfl: 3   tirzepatide  (MOUNJARO ) 2.5 MG/0.5ML Pen, Inject 2.5 mg into the skin once a week., Disp: 6 mL, Rfl:  4   triamcinolone  cream (KENALOG ) 0.1 %, Apply 1 Application topically 2 (two) times daily as needed (eczema flare for up to 7 days)., Disp: 80 g, Rfl: 1 Social History   Socioeconomic History   Marital status: Married    Spouse name: Not on file   Number of children: Not on file   Years of education: Not on file   Highest education level: Associate degree: occupational, Scientist, product/process development, or vocational program  Occupational History   Not on file  Tobacco Use   Smoking status: Former    Current packs/day: 0.00    Average packs/day: 1 pack/day for 10.0 years (10.0 ttl pk-yrs)    Types: Cigarettes    Start date: 62    Quit date: 2    Years since quitting: 37.6   Smokeless tobacco: Never  Vaping Use   Vaping status: Never Used  Substance and Sexual Activity   Alcohol use: Not Currently   Drug use: No   Sexual activity: Yes    Birth control/protection: Surgical  Other Topics Concern   Not on file  Social History Narrative   Patient resides with her spouse.  She has a son and a daughter, both of whom live within 1 hour of her home.  She also has pets.  No fall risks however.  They are well-behaved.  She retired in June 2023.   Social Drivers of Corporate investment banker Strain: Low Risk  (11/07/2023)   Overall Financial Resource Strain (CARDIA)    Difficulty of Paying Living Expenses: Not hard at all  Food Insecurity: No Food Insecurity (11/07/2023)   Hunger Vital Sign    Worried About Running Out of Food in the Last Year: Never true    Ran Out of Food in the Last Year: Never true  Transportation Needs: No Transportation Needs (11/07/2023)   PRAPARE - Administrator, Civil Service (Medical): No    Lack of Transportation (Non-Medical): No  Physical Activity: Insufficiently Active (11/07/2023)   Exercise Vital Sign    Days of Exercise per Week: 3 days    Minutes of Exercise per Session: 20 min  Stress: No Stress Concern Present (11/07/2023)   [employer] of  Occupational Health - Occupational Stress Questionnaire    Feeling of Stress: Not at all  Social Connections: Socially Integrated (11/07/2023)   Social Connection and Isolation Panel    Frequency of Communication with Friends and Family: More than three times a week    Frequency of Social Gatherings with Friends and Family: Twice a week    Attends Religious Services:  More than 4 times per year    Active Member of Clubs or Organizations: Yes    Attends Banker Meetings: More than 4 times per year    Marital Status: Married  Catering manager Violence: Not At Risk (05/05/2023)   Humiliation, Afraid, Rape, and Kick questionnaire    Fear of Current or Ex-Partner: No    Emotionally Abused: No    Physically Abused: No    Sexually Abused: No   Family History  Problem Relation Age of Onset   Thyroid  disease Mother    Diabetes Mother    Cancer Mother    Cancer Father    Lupus Daughter    Arthritis/Rheumatoid Daughter    Diabetes type II Daughter     Objective: Office vital signs reviewed. BP (!) 149/74   Pulse 70   Temp (!) 96.5 F (35.8 C) (Temporal)   Ht 5' 5 (1.651 m)   Wt 190 lb 6.4 oz (86.4 kg)   SpO2 100%   BMI 31.68 kg/m   Physical Examination:  General: Awake, alert, well nourished, No acute distress Skin: Right forearm with 0.75 skin tear.  She has surrounding erythema.  There is yellow discharge on her bandage.  Mild tenderness to palpation.  No significant bleeding  Assessment/ Plan: 68 y.o. female   Infected skin tear - Plan: doxycycline  (VIBRA -TABS) 100 MG tablet, fluconazole  (DIFLUCAN ) 150 MG tablet  Declines tetanus shot due to alpha gal allergy.  Doxycycline  ordered for twice daily use.  Advised to take with food.  Diflucan  also sent for as needed use.  Home care instructions were reviewed and reasons for reevaluation discussed.  Follow-up as needed   Leach CHRISTELLA Fielding, DO [facility] [phone]

## 2023-11-17 ENCOUNTER — Ambulatory Visit: Admitting: Family Medicine

## 2023-11-28 ENCOUNTER — Encounter: Payer: Self-pay | Admitting: Family Medicine

## 2023-11-28 DIAGNOSIS — F17200 Nicotine dependence, unspecified, uncomplicated: Secondary | ICD-10-CM

## 2023-12-28 ENCOUNTER — Encounter: Payer: Self-pay | Admitting: Family Medicine

## 2023-12-29 NOTE — Telephone Encounter (Signed)
 Is patient supposed to be having another thyroid  ultrasound?

## 2024-02-07 ENCOUNTER — Ambulatory Visit: Payer: Self-pay | Admitting: Family Medicine

## 2024-02-07 ENCOUNTER — Encounter: Payer: Self-pay | Admitting: Family Medicine

## 2024-02-07 VITALS — BP 142/66 | HR 74 | Temp 97.5°F | Ht 65.0 in | Wt 178.5 lb

## 2024-02-07 DIAGNOSIS — K754 Autoimmune hepatitis: Secondary | ICD-10-CM

## 2024-02-07 DIAGNOSIS — E2740 Unspecified adrenocortical insufficiency: Secondary | ICD-10-CM | POA: Diagnosis not present

## 2024-02-07 DIAGNOSIS — K219 Gastro-esophageal reflux disease without esophagitis: Secondary | ICD-10-CM

## 2024-02-07 DIAGNOSIS — E1169 Type 2 diabetes mellitus with other specified complication: Secondary | ICD-10-CM

## 2024-02-07 DIAGNOSIS — E041 Nontoxic single thyroid nodule: Secondary | ICD-10-CM

## 2024-02-07 DIAGNOSIS — I152 Hypertension secondary to endocrine disorders: Secondary | ICD-10-CM

## 2024-02-07 DIAGNOSIS — H6593 Unspecified nonsuppurative otitis media, bilateral: Secondary | ICD-10-CM

## 2024-02-07 DIAGNOSIS — E663 Overweight: Secondary | ICD-10-CM

## 2024-02-07 DIAGNOSIS — Z7985 Long-term (current) use of injectable non-insulin antidiabetic drugs: Secondary | ICD-10-CM

## 2024-02-07 DIAGNOSIS — Z780 Asymptomatic menopausal state: Secondary | ICD-10-CM

## 2024-02-07 DIAGNOSIS — E66811 Obesity, class 1: Secondary | ICD-10-CM | POA: Insufficient documentation

## 2024-02-07 DIAGNOSIS — E1159 Type 2 diabetes mellitus with other circulatory complications: Secondary | ICD-10-CM | POA: Diagnosis not present

## 2024-02-07 DIAGNOSIS — B351 Tinea unguium: Secondary | ICD-10-CM

## 2024-02-07 DIAGNOSIS — E785 Hyperlipidemia, unspecified: Secondary | ICD-10-CM

## 2024-02-07 LAB — BAYER DCA HB A1C WAIVED: HB A1C (BAYER DCA - WAIVED): 4.6 % — ABNORMAL LOW (ref 4.8–5.6)

## 2024-02-07 MED ORDER — ATORVASTATIN CALCIUM 10 MG PO TABS: 10.0000 mg | ORAL_TABLET | Freq: Every day | ORAL | 3 refills | Status: AC

## 2024-02-07 MED ORDER — PANTOPRAZOLE SODIUM 40 MG PO TBEC: 40.0000 mg | DELAYED_RELEASE_TABLET | Freq: Every day | ORAL | 3 refills | Status: AC

## 2024-02-07 MED ORDER — CICLOPIROX 8 % EX SOLN: Freq: Every day | CUTANEOUS | 1 refills | Status: AC

## 2024-02-07 MED ORDER — TIRZEPATIDE 2.5 MG/0.5ML ~~LOC~~ SOAJ: 2.5000 mg | SUBCUTANEOUS | 4 refills | Status: AC

## 2024-02-07 MED ORDER — FUROSEMIDE 20 MG PO TABS: 20.0000 mg | ORAL_TABLET | Freq: Every day | ORAL | 3 refills | Status: AC | PRN

## 2024-02-07 MED ORDER — FLUTICASONE PROPIONATE 50 MCG/ACT NA SUSP: 2.0000 | Freq: Every day | NASAL | 6 refills | Status: AC

## 2024-02-07 NOTE — Telephone Encounter (Signed)
 Pt needs future orders for lab apt on 05/02/2024. Pt has an apt with DR G on 05/06/2024.

## 2024-02-07 NOTE — Progress Notes (Signed)
 Will add future lab order once time gets closer to appt.

## 2024-02-07 NOTE — Progress Notes (Signed)
 Taylor Leach is a 68 y.o. female presents to office today for annual physical exam examination.     Type 2 Diabetes with hypertension, hyperlipidemia associated with autoimmune hepatitis:  Compliant with Mounjaro  every 2 to 3 weeks.  Not having any GI upset with the medication.  She is actually lost about 20 pounds since her last visit and this has been primarily because of her new diagnosis of alpha gal.  She and her husband have really changed their diet dramatically and this seems to have been for the better.  She is feeling well.  She continue to follow-up with her hepatologist and there have been no medication changes.  They have recommended ongoing treatment with both the GIP and the statin.  Patient occasionally does get breakthrough GERD but notes that she utilizes Pepcid  in addition to her Protonix  which alleviates symptoms.  She has persistent onychomycotic changes to the toenails and would like to have renewal on the Penlac  solution.  Edema may be coming from her liver but she is not had any a ascites.  Her hepatologist recommended continued as needed use of Lasix  for lower extremity edema  Last eye exam: UTD Last foot exam: needs Last A1c:  Lab Results  Component Value Date   HGBA1C 6.7 (H) 09/06/2023   Nephropathy screen indicated?: UTD Last flu, zoster and/or pneumovax:  Immunization History  Administered Date(s) Administered   Hep A / Hep B 03/24/2008, 04/23/2008, 09/08/2008   Influenza, Seasonal, Injecte, Preservative Fre 01/10/2008   Influenza,inj,Quad PF,6+ Mos 05/18/2016   Influenza-Unspecified 02/02/2018   PFIZER(Purple Top)SARS-COV-2 Vaccination 11/08/2019, 11/29/2019   PNEUMOCOCCAL CONJUGATE-20 10/29/2021   Pneumococcal Polysaccharide-23 11/30/2017   Zoster Recombinant(Shingrix) 12/12/2018, 06/12/2019    ROS: Denies dizziness, LOC, polyuria, polydipsia, unintended weight loss/gain, foot ulcerations, shortness of breath or chest pain.  She will see her dermatologist  next week.  Occupation: retired, Marital status: married, Substance use: none Health Maintenance Due  Topic Date Due   COVID-19 Vaccine (3 - Pfizer risk series) 12/27/2019   DEXA SCAN  06/16/2023   Influenza Vaccine  11/03/2023   FOOT EXAM  11/09/2023    Immunization History  Administered Date(s) Administered   Hep A / Hep B 03/24/2008, 04/23/2008, 09/08/2008   Influenza, Seasonal, Injecte, Preservative Fre 01/10/2008   Influenza,inj,Quad PF,6+ Mos 05/18/2016   Influenza-Unspecified 02/02/2018   PFIZER(Purple Top)SARS-COV-2 Vaccination 11/08/2019, 11/29/2019   PNEUMOCOCCAL CONJUGATE-20 10/29/2021   Pneumococcal Polysaccharide-23 11/30/2017   Zoster Recombinant(Shingrix) 12/12/2018, 06/12/2019   Past Medical History:  Diagnosis Date   Arthritis    hands   Atypical chest pain 04/14/2022   See cardiology OV note from 04/14/22 in Epic. Atypical chest pain thought to be related to a hiatal hernia.   Autoimmune hepatitis (HCC)    Follows w/ gastroenterology. LOV 12/14/21 with [name], AGNP (as of 04/18/22) in Epic.   Chest pain    Stress echo normal, October, 2012   COVID-19 03/2021   flu like symptoms, patient was prescribed an antiviral   Diabetes mellitus (HCC) 07/28/2016   Type 2, follows w/ PCP, Dr. Rosina Fielding, LOV 01/28/22 (as of 0/15/24.) HgbA1c 6.8 on 01/28/22.   Dyslipidemia    Follows w/ PCP.   Ejection fraction    EF 55-60%, echo, October, 2012   GERD (gastroesophageal reflux disease)    Hiatal hernia    found during EGD   Idiopathic neuropathy    feet   NASH (nonalcoholic steatohepatitis)    chronic, follows w/ gastro, lov 12/14/21 in  Epic   Wears glasses    Social History   Socioeconomic History   Marital status: Married    Spouse name: Not on file   Number of children: Not on file   Years of education: Not on file   Highest education level: Some college, no degree  Occupational History   Not on file  Tobacco Use   Smoking status: Former     Current packs/day: 0.00    Average packs/day: 1 pack/day for 10.0 years (10.0 ttl pk-yrs)    Types: Cigarettes    Start date: 34    Quit date: 76    Years since quitting: 37.8   Smokeless tobacco: Never  Vaping Use   Vaping status: Never Used  Substance and Sexual Activity   Alcohol use: Not Currently   Drug use: No   Sexual activity: Yes    Birth control/protection: Surgical  Other Topics Concern   Not on file  Social History Narrative   Patient resides with her spouse.  She has a son and a daughter, both of whom live within 1 hour of her home.  She also has pets.  No fall risks however.  They are well-behaved.  She retired in June 2023.   Social Drivers of Corporate Investment Banker Strain: Low Risk  (02/06/2024)   Overall Financial Resource Strain (CARDIA)    Difficulty of Paying Living Expenses: Not hard at all  Food Insecurity: No Food Insecurity (02/06/2024)   Hunger Vital Sign    Worried About Running Out of Food in the Last Year: Never true    Ran Out of Food in the Last Year: Never true  Transportation Needs: No Transportation Needs (11/07/2023)   PRAPARE - Administrator, Civil Service (Medical): No    Lack of Transportation (Non-Medical): No  Physical Activity: Insufficiently Active (02/06/2024)   Exercise Vital Sign    Days of Exercise per Week: 4 days    Minutes of Exercise per Session: 20 min  Stress: No Stress Concern Present (02/06/2024)   [employer] of Occupational Health - Occupational Stress Questionnaire    Feeling of Stress: Not at all  Social Connections: Socially Integrated (02/06/2024)   Social Connection and Isolation Panel    Frequency of Communication with Friends and Family: More than three times a week    Frequency of Social Gatherings with Friends and Family: Twice a week    Attends Religious Services: More than 4 times per year    Active Member of [employer] or Organizations: Yes    Attends Banker Meetings: More  than 4 times per year    Marital Status: Married  Catering Manager Violence: Not At Risk (05/05/2023)   Humiliation, Afraid, Rape, and Kick questionnaire    Fear of Current or Ex-Partner: No    Emotionally Abused: No    Physically Abused: No    Sexually Abused: No   Past Surgical History:  Procedure Laterality Date   CHOLECYSTECTOMY  2008   COLONOSCOPY  06/10/2019   multiple colon polyps   CYSTOSCOPY N/A 04/20/2022   Procedure: CYSTOSCOPY;  Surgeon: Marilynne Rosaline SAILOR, MD;  Location: [facility];  Service: Gynecology;  Laterality: N/A;   ESOPHAGOGASTRODUODENOSCOPY     around 2018   EYE SURGERY Bilateral 07/15/2021   eye lids   LIVER BIOPSY  2005   LIVER BIOPSY  2019   steatohepatitis & moderate lymphoplasmacytic infiltrate & stage 2 -3 fibrosis   NASAL SINUS SURGERY  around 2001   RECTOCELE REPAIR  2014   Dr. Thyra- [place]   ROBOTIC ASSISTED LAPAROSCOPIC SACROCOLPOPEXY N/A 04/20/2022   Procedure: XI ROBOTIC ASSISTED LAPAROSCOPIC SACROCOLPOPEXY;  Surgeon: Marilynne Rosaline SAILOR, MD;  Location: [facility];  Service: Gynecology;  Laterality: N/A;   VAGINAL HYSTERECTOMY  1999   Family History  Problem Relation Age of Onset   Thyroid  disease Mother    Diabetes Mother    Cancer Mother    Cancer Father    Lupus Daughter    Arthritis/Rheumatoid Daughter    Diabetes type II Daughter     Current Outpatient Medications:    atorvastatin  (LIPITOR) 10 MG tablet, Take 1 tablet (10 mg total) by mouth daily., Disp: 90 tablet, Rfl: 3   blood glucose meter kit and supplies KIT, Dispense based on patient and insurance preference. Use up to four times daily as directed. (FOR ICD-9 250.00, 250.01). E11.9, Disp: 1 each, Rfl: 0   cetirizine  (ZYRTEC ) 10 MG tablet, Take by mouth as needed., Disp: , Rfl:    Cholecalciferol (VITAMIN D-3 PO), Take 1 tablet by mouth daily., Disp: , Rfl:    ciclopirox  (PENLAC ) 8 % solution, Apply topically at  bedtime. Apply over nail and surrounding skin. Apply daily over previous coat. After seven (7) days, may remove with alcohol and continue cycle., Disp: 6.6 mL, Rfl: 1   EPINEPHrine  0.3 mg/0.3 mL IJ SOAJ injection, Inject 0.3 mg into the muscle as needed for anaphylaxis., Disp: 1 each, Rfl: 1   fluorouracil  (EFUDEX ) 5 % cream, Apply topically 2 (two) times daily. X2-4 weeks, Disp: 40 g, Rfl: 1   fluticasone  (FLONASE ) 50 MCG/ACT nasal spray, Place 2 sprays into both nostrils daily., Disp: 16 g, Rfl: 6   furosemide  (LASIX ) 20 MG tablet, Take 1 tablet (20 mg total) by mouth daily as needed for edema or fluid., Disp: 90 tablet, Rfl: 3   levocetirizine (XYZAL ) 5 MG tablet, Take 1 tablet (5 mg total) by mouth every evening., Disp: 90 tablet, Rfl: 1   mycophenolate (MYFORTIC) 360 MG TBEC EC tablet, Take 360 mg by mouth 2 (two) times daily., Disp: , Rfl:    ondansetron  (ZOFRAN -ODT) 4 MG disintegrating tablet, Take 1 tablet (4 mg total) by mouth every 8 (eight) hours as needed for nausea or vomiting., Disp: 30 tablet, Rfl: 1   pantoprazole  (PROTONIX ) 40 MG tablet, Take 1 tablet (40 mg total) by mouth daily., Disp: 90 tablet, Rfl: 3   tirzepatide  (MOUNJARO ) 2.5 MG/0.5ML Pen, Inject 2.5 mg into the skin once a week., Disp: 6 mL, Rfl: 4   triamcinolone  cream (KENALOG ) 0.1 %, Apply 1 Application topically 2 (two) times daily as needed (eczema flare for up to 7 days)., Disp: 80 g, Rfl: 1  Allergies  Allergen Reactions   Fenofibrate Micronized Other (See Comments)    Caused increased LFT's  Caused increased LFT's    Caused increased LFT's, Caused increased LFT's, Caused increased LFT's, Caused increased LFT's   Glipizide      Hypoglycemia    Hydrocodone Nausea And Vomiting and Other (See Comments)    Makes body hot.  Makes body hot.     Makes body hot    Makes body hot. , Makes body hot   Alpha-Gal    Codeine  Nausea And Vomiting   Farxiga [Dapagliflozin]     Bladder pains   Janumet  [Sitagliptin   Phos-Metformin  Hcl]     Extremely bad gas    Tape     Can  cause skin to tear     ROS: Review of Systems Pertinent items noted in HPI and remainder of comprehensive ROS otherwise negative.    Physical exam BP (!) 142/66   Pulse 74   Temp (!) 97.5 F (36.4 C)   Ht 5' 5 (1.651 m)   Wt 178 lb 8 oz (81 kg)   SpO2 99%   BMI 29.70 kg/m  General appearance: alert, cooperative, appears stated age, and no distress Head: Normocephalic, without obvious abnormality, atraumatic Eyes: negative findings: lids and lashes normal, conjunctivae and sclerae normal, corneas clear, pupils equal, round, reactive to light and accomodation, and wears glasses Ears: Right external ear with dermatitis noted.  TMs intact bilaterally with normal light reflex Nose: Nares normal. Septum midline. Mucosa normal. No drainage or sinus tenderness. Throat: lips, mucosa, and tongue normal; teeth and gums normal Neck: no adenopathy, no carotid bruit, supple, symmetrical, trachea midline, and thyroid  not enlarged, symmetric, no tenderness/mass/nodules Back: symmetric, no curvature. ROM normal. No CVA tenderness. Lungs: clear to auscultation bilaterally Heart: regular rate and rhythm, S1, S2 normal, no murmur, click, rub or gallop Abdomen: soft, non-tender; bowel sounds normal; no masses,  no organomegaly Extremities: extremities normal, atraumatic, no cyanosis or edema Pulses: 2+ and symmetric Skin: Dermatitis along the anterior right shin as well as the right external ear Lymph nodes: Anterior cervical lymph node and supraclavicular lymph nodes without enlargement Neurologic: Alert and oriented X 3, normal strength and tone. Normal symmetric reflexes. Normal coordination and gait      11/08/2023    1:58 PM 09/06/2023    8:24 AM 08/24/2023    8:41 AM  Depression screen PHQ 2/9  Decreased Interest 0 0 0  Down, Depressed, Hopeless 0 0 0  PHQ - 2 Score 0 0 0  Altered sleeping  0 0  Tired, decreased energy  0 0   Change in appetite  0 0  Feeling bad or failure about yourself   0 0  Trouble concentrating  0 0  Moving slowly or fidgety/restless  0 0  Suicidal thoughts  0 0  PHQ-9 Score  0 0  Difficult doing work/chores  Not difficult at all Not difficult at all      11/08/2023    1:58 PM 09/06/2023    8:24 AM 08/24/2023    8:41 AM 06/02/2023   12:56 PM  GAD 7 : Generalized Anxiety Score  Nervous, Anxious, on Edge 0 0 0 0  Control/stop worrying 0 0 0 0  Worry too much - different things 0 0 0 0  Trouble relaxing 0 0 0 0  Restless 0 0 0 0  Easily annoyed or irritable 0 0 0 0  Afraid - awful might happen 0 0 0 0  Total GAD 7 Score 0 0 0 0  Anxiety Difficulty Not difficult at all Not difficult at all Not difficult at all Not difficult at all    No results found for this or any previous visit (from the past 2160 hours).   Assessment/ Plan: Taylor Leach here for annual physical exam.   Diabetes mellitus treated with injections of non-insulin medication (HCC) - Plan: CMP14+EGFR, Bayer DCA Hb A1c Waived, tirzepatide  (MOUNJARO ) 2.5 MG/0.5ML Pen  Hypertension associated with diabetes (HCC) - Plan: CMP14+EGFR  Hyperlipidemia associated with type 2 diabetes mellitus (HCC) - Plan: CMP14+EGFR, Lipid Panel, atorvastatin  (LIPITOR) 10 MG tablet  Autoimmune hepatitis (HCC) - Plan: CMP14+EGFR, CBC with Differential, Gamma GT  Adrenal insufficiency - Plan: CMP14+EGFR, DG  [facility] DEXA  Thyroid  nodule - Plan: TSH + free T4  Gastroesophageal reflux disease without esophagitis - Plan: CBC with Differential, pantoprazole  (PROTONIX ) 40 MG tablet  Asymptomatic postmenopausal estrogen deficiency - Plan: DG [facility] DEXA  Fluid level behind tympanic membrane of both ears - Plan: fluticasone  (FLONASE ) 50 MCG/ACT nasal spray  Onychomycosis - Plan: ciclopirox  (PENLAC ) 8 % solution  Overweight (BMI 25.0-29.9)   Sugar-excellent control and down to 4.6.  This is the lowest its ever been and I suspect this is primarily  due to her excellent changes from a dietary standpoint.  Will check fasting labs for today and I will make sure that her hepatologist gets specialty labs faxed to them.  She is to keep appointment with her endocrinologist for follow-up on adrenal insufficiency but symptomatically she seems to be doing well  Check thyroid  levels given history of nodule that requires no further follow-up with imaging  GERD stable.  PPI renewed.  Check CBC  DEXA ordered for follow-up on bone density  Did not discuss URI symptoms today as she did not have any but needed Flonase  renewed  Onychomycotic changes of the nails on exam and Penlac  solution renewed.  BMI has gone from obesity to overweight  She declined influenza vaccination today  Counseled on healthy lifestyle choices, including diet (rich in fruits, vegetables and lean meats and low in salt and simple carbohydrates) and exercise (at least 30 minutes of moderate physical activity daily).  Patient to follow up 20m  Achillies Buehl M. Jolinda, DO

## 2024-02-07 NOTE — Progress Notes (Signed)
 Once your lab work that was collected today, has been reviewed by your PCP, we will know what future lab orders to place for your next visit.

## 2024-02-08 LAB — LIPID PANEL
Chol/HDL Ratio: 3.4 ratio (ref 0.0–4.4)
Cholesterol, Total: 133 mg/dL (ref 100–199)
HDL: 39 mg/dL — ABNORMAL LOW (ref >39)
LDL Chol Calc (NIH): 63 mg/dL (ref 0–99)
Triglycerides: 187 mg/dL — ABNORMAL HIGH (ref 0–149)
VLDL Cholesterol Cal: 31 mg/dL (ref 5–40)

## 2024-02-08 LAB — CBC WITH DIFFERENTIAL/PLATELET
Basophils Absolute: 0 x10E3/uL (ref 0.0–0.2)
Basos: 0 %
EOS (ABSOLUTE): 0.1 x10E3/uL (ref 0.0–0.4)
Eos: 2 %
Hematocrit: 42 % (ref 34.0–46.6)
Hemoglobin: 14 g/dL (ref 11.1–15.9)
Immature Grans (Abs): 0 x10E3/uL (ref 0.0–0.1)
Immature Granulocytes: 0 %
Lymphocytes Absolute: 0.9 x10E3/uL (ref 0.7–3.1)
Lymphs: 19 %
MCH: 31.3 pg (ref 26.6–33.0)
MCHC: 33.3 g/dL (ref 31.5–35.7)
MCV: 94 fL (ref 79–97)
Monocytes Absolute: 0.6 x10E3/uL (ref 0.1–0.9)
Monocytes: 12 %
Neutrophils Absolute: 3.1 x10E3/uL (ref 1.4–7.0)
Neutrophils: 67 %
Platelets: 148 x10E3/uL — ABNORMAL LOW (ref 150–450)
RBC: 4.48 x10E6/uL (ref 3.77–5.28)
RDW: 13.1 % (ref 11.7–15.4)
WBC: 4.7 x10E3/uL (ref 3.4–10.8)

## 2024-02-08 LAB — CMP14+EGFR
ALT: 33 IU/L — ABNORMAL HIGH (ref 0–32)
AST: 36 IU/L (ref 0–40)
Albumin: 4 g/dL (ref 3.9–4.9)
Alkaline Phosphatase: 92 IU/L (ref 49–135)
BUN/Creatinine Ratio: 14 (ref 12–28)
BUN: 14 mg/dL (ref 8–27)
Bilirubin Total: 0.9 mg/dL (ref 0.0–1.2)
CO2: 24 mmol/L (ref 20–29)
Calcium: 9.4 mg/dL (ref 8.7–10.3)
Chloride: 101 mmol/L (ref 96–106)
Creatinine, Ser: 0.97 mg/dL (ref 0.57–1.00)
Globulin, Total: 3.8 g/dL (ref 1.5–4.5)
Glucose: 98 mg/dL (ref 70–99)
Potassium: 4.1 mmol/L (ref 3.5–5.2)
Sodium: 138 mmol/L (ref 134–144)
Total Protein: 7.8 g/dL (ref 6.0–8.5)
eGFR: 64 mL/min/1.73 (ref >59)

## 2024-02-08 LAB — TSH+FREE T4
Free T4: 1.27 ng/dL (ref 0.82–1.77)
TSH: 1.78 u[IU]/mL (ref 0.450–4.500)

## 2024-02-08 LAB — GAMMA GT: GGT: 33 IU/L (ref 0–60)

## 2024-02-09 ENCOUNTER — Ambulatory Visit: Payer: Self-pay | Admitting: Family Medicine

## 2024-03-07 ENCOUNTER — Other Ambulatory Visit

## 2024-03-08 LAB — COMPREHENSIVE METABOLIC PANEL WITH GFR
ALT: 27 IU/L (ref 0–32)
AST: 31 IU/L (ref 0–40)
Albumin: 4.1 g/dL (ref 3.9–4.9)
Alkaline Phosphatase: 83 IU/L (ref 49–135)
BUN/Creatinine Ratio: 15 (ref 12–28)
BUN: 13 mg/dL (ref 8–27)
Bilirubin Total: 0.6 mg/dL (ref 0.0–1.2)
CO2: 22 mmol/L (ref 20–29)
Calcium: 9.5 mg/dL (ref 8.7–10.3)
Chloride: 105 mmol/L (ref 96–106)
Creatinine, Ser: 0.89 mg/dL (ref 0.57–1.00)
Globulin, Total: 3.7 g/dL (ref 1.5–4.5)
Glucose: 98 mg/dL (ref 70–99)
Potassium: 3.7 mmol/L (ref 3.5–5.2)
Sodium: 140 mmol/L (ref 134–144)
Total Protein: 7.8 g/dL (ref 6.0–8.5)
eGFR: 71 mL/min/1.73 (ref >59)

## 2024-03-08 LAB — CORTISOL-AM, BLOOD: Cortisol - AM: 7.3 ug/dL (ref 6.2–19.4)

## 2024-03-11 ENCOUNTER — Telehealth: Admitting: Family Medicine

## 2024-03-11 ENCOUNTER — Encounter: Payer: Self-pay | Admitting: Family Medicine

## 2024-03-11 DIAGNOSIS — M2550 Pain in unspecified joint: Secondary | ICD-10-CM

## 2024-03-11 MED ORDER — MELOXICAM 15 MG PO TABS: 7.5000 mg | ORAL_TABLET | Freq: Every day | ORAL | 3 refills | Status: AC | PRN

## 2024-03-11 NOTE — Progress Notes (Signed)
 MyChart Video visit  Subjective: Taylor Leach PCP: Taylor Norene HERO, DO YEP:Fjmb DUHA ABAIR is a 68 y.o. female. Patient provides verbal consent for consult held via video.  Due to COVID-19 pandemic this visit was conducted virtually. This visit type was conducted due to national recommendations for restrictions regarding the COVID-19 Pandemic (e.g. social distancing, sheltering in place) in an effort to limit this patient's exposure and mitigate transmission in our community. All issues noted in this document were discussed and addressed.  A physical exam was not performed with this format.   Location of patient: home Location of provider: [facility] Others present for call: none  1. Polyarthralgia/ hand pain Reports little knots along the DIP/ PIP joints in bilateral hands left>right.  She notes that they become hard to bend when they get really swollen and have really not improved substantially with ibuprofen  though ibuprofen  does help to some degree.  She has known autoimmune hepatitis but has not been evaluated for autoimmune joint disease in the past.  ROS: Per HPI  Allergies  Allergen Reactions   Fenofibrate Micronized Other (See Comments)    Caused increased LFT's  Caused increased LFT's    Caused increased LFT's, Caused increased LFT's, Caused increased LFT's, Caused increased LFT's   Glipizide      Hypoglycemia    Hydrocodone Nausea And Vomiting and Other (See Comments)    Makes body hot.  Makes body hot.     Makes body hot    Makes body hot. , Makes body hot   Alpha-Gal    Codeine  Nausea And Vomiting   Farxiga [Dapagliflozin]     Bladder pains   Janumet  [Sitagliptin  Phos-Metformin  Hcl]     Extremely bad gas    Tape     Can cause skin to tear   Past Medical History:  Diagnosis Date   Arthritis    hands   Atypical chest pain 04/14/2022   See cardiology OV note from 04/14/22 in Epic. Atypical chest pain thought to be related to a hiatal hernia.   Autoimmune  hepatitis (HCC)    Follows w/ gastroenterology. LOV 12/14/21 with Taylor Leach, AGNP (as of 04/18/22) in Epic.   Chest pain    Stress echo normal, October, 2012   COVID-19 03/2021   flu like symptoms, patient was prescribed an antiviral   Diabetes mellitus (HCC) 07/28/2016   Type 2, follows w/ PCP, Dr. Rosina Taylor, LOV 01/28/22 (as of 0/15/24.) HgbA1c 6.8 on 01/28/22.   Dyslipidemia    Follows w/ PCP.   Ejection fraction    EF 55-60%, echo, October, 2012   GERD (gastroesophageal reflux disease)    Hiatal hernia    found during EGD   Idiopathic neuropathy    feet   NASH (nonalcoholic steatohepatitis)    chronic, follows w/ gastro, lov 12/14/21 in Epic   Wears glasses     Current Outpatient Medications:    atorvastatin  (LIPITOR) 10 MG tablet, Take 1 tablet (10 mg total) by mouth daily., Disp: 90 tablet, Rfl: 3   blood glucose meter kit and supplies KIT, Dispense based on patient and insurance preference. Use up to four times daily as directed. (FOR ICD-9 250.00, 250.01). E11.9, Disp: 1 each, Rfl: 0   cetirizine  (ZYRTEC ) 10 MG tablet, Take by mouth as needed., Disp: , Rfl:    Cholecalciferol (VITAMIN D-3 PO), Take 1 tablet by mouth daily. (Patient not taking: Reported on 02/07/2024), Disp: , Rfl:    ciclopirox  (PENLAC ) 8 % solution, Apply topically at bedtime. Apply  over nail and surrounding skin. Apply daily over previous coat. After seven (7) days, may remove with alcohol and continue cycle., Disp: 6.6 mL, Rfl: 1   EPINEPHrine  0.3 mg/0.3 mL IJ SOAJ injection, Inject 0.3 mg into the muscle as needed for anaphylaxis., Disp: 1 each, Rfl: 1   fluorouracil  (EFUDEX ) 5 % cream, Apply topically 2 (two) times daily. X2-4 weeks, Disp: 40 g, Rfl: 1   fluticasone  (FLONASE ) 50 MCG/ACT nasal spray, Place 2 sprays into both nostrils daily., Disp: 16 g, Rfl: 6   furosemide  (LASIX ) 20 MG tablet, Take 1 tablet (20 mg total) by mouth daily as needed for edema or fluid., Disp: 90 tablet, Rfl: 3    levocetirizine (XYZAL ) 5 MG tablet, Take 1 tablet (5 mg total) by mouth every evening., Disp: 90 tablet, Rfl: 1   mycophenolate (MYFORTIC) 360 MG TBEC EC tablet, Take 360 mg by mouth 2 (two) times daily., Disp: , Rfl:    ondansetron  (ZOFRAN -ODT) 4 MG disintegrating tablet, Take 1 tablet (4 mg total) by mouth every 8 (eight) hours as needed for nausea or vomiting., Disp: 30 tablet, Rfl: 1   pantoprazole  (PROTONIX ) 40 MG tablet, Take 1 tablet (40 mg total) by mouth daily., Disp: 90 tablet, Rfl: 3   tirzepatide  (MOUNJARO ) 2.5 MG/0.5ML Pen, Inject 2.5 mg into the skin once a week., Disp: 6 mL, Rfl: 4   triamcinolone  cream (KENALOG ) 0.1 %, Apply 1 Application topically 2 (two) times daily as needed (eczema flare for up to 7 days)., Disp: 80 g, Rfl: 1  MSK: She has some deformity at the DIP of the hands bilaterally left greater than right.  There does appear to be mild redness and mild soft tissue swelling.  Has somewhat limited active range of motion of the hands to form a fist  Assessment/ Plan: 68 y.o. female   Polyarthralgia - Plan: meloxicam  (MOBIC ) 15 MG tablet, Arthritis Panel, ANA w/Reflex if Positive  Discussed holding ibuprofen , trial of meloxicam .  Avoiding Tylenol  containing products due to autoimmune hepatitis at this time.  Encourage p.o. hydration and taking medication with food.  I have placed order for arthritis panel as well to evaluate for potential autoimmune etiology  Start time: 11:44am End time: 11:51a  Total time spent on patient care (including video visit/ documentation): 12 minutes  Mccormick Macon CHRISTELLA Fielding, DO [facility] [phone]

## 2024-03-18 ENCOUNTER — Ambulatory Visit: Admitting: "Endocrinology

## 2024-03-18 ENCOUNTER — Encounter: Payer: Self-pay | Admitting: "Endocrinology

## 2024-03-18 VITALS — BP 138/76 | HR 68 | Ht 65.0 in | Wt 177.6 lb

## 2024-03-18 DIAGNOSIS — E2740 Unspecified adrenocortical insufficiency: Secondary | ICD-10-CM

## 2024-03-18 DIAGNOSIS — E119 Type 2 diabetes mellitus without complications: Secondary | ICD-10-CM

## 2024-03-18 DIAGNOSIS — E782 Mixed hyperlipidemia: Secondary | ICD-10-CM

## 2024-03-18 DIAGNOSIS — Z7985 Long-term (current) use of injectable non-insulin antidiabetic drugs: Secondary | ICD-10-CM

## 2024-03-18 NOTE — Progress Notes (Signed)
 03/18/2024, 12:38 PM   Endocrinology follow-up note  Subjective:    Patient ID: Taylor Leach, female    DOB: November 28, 1955, PCP Taylor Norene HERO, DO   Past Medical History:  Diagnosis Date   Arthritis    hands   Atypical chest pain 04/14/2022   See cardiology OV note from 04/14/22 in Epic. Atypical chest pain thought to be related to a hiatal hernia.   Autoimmune hepatitis (HCC)    Follows w/ gastroenterology. LOV 12/14/21 with Taylor Leach, AGNP (as of 04/18/22) in Epic.   Chest pain    Stress echo normal, October, 2012   COVID-19 03/2021   flu like symptoms, patient was prescribed an antiviral   Diabetes mellitus (HCC) 07/28/2016   Type 2, follows w/ PCP, Taylor Leach, LOV 01/28/22 (as of 0/15/24.) HgbA1c 6.8 on 01/28/22.   Dyslipidemia    Follows w/ PCP.   Ejection fraction    EF 55-60%, echo, October, 2012   GERD (gastroesophageal reflux disease)    Hiatal hernia    found during EGD   Idiopathic neuropathy    feet   NASH (nonalcoholic steatohepatitis)    chronic, follows w/ gastro, lov 12/14/21 in Epic   Wears glasses    Past Surgical History:  Procedure Laterality Date   CHOLECYSTECTOMY  2008   COLONOSCOPY  06/10/2019   multiple colon polyps   CYSTOSCOPY N/A 04/20/2022   Procedure: CYSTOSCOPY;  Surgeon: Taylor Rosaline SAILOR, MD;  Location: [facility];  Service: Gynecology;  Laterality: N/A;   ESOPHAGOGASTRODUODENOSCOPY     around 2018   EYE SURGERY Bilateral 07/15/2021   eye lids   LIVER BIOPSY  2005   LIVER BIOPSY  2019   steatohepatitis & moderate lymphoplasmacytic infiltrate & stage 2 -3 fibrosis   NASAL SINUS SURGERY     around 2001   [facility]   ROBOTIC ASSISTED LAPAROSCOPIC SACROCOLPOPEXY N/A 04/20/2022   Procedure: XI ROBOTIC ASSISTED LAPAROSCOPIC SACROCOLPOPEXY;  Surgeon: Taylor Rosaline SAILOR, MD;  Location: [facility];  Service: Gynecology;  Laterality: N/A;   VAGINAL HYSTERECTOMY  1999   Social History   Socioeconomic History   Marital status: Married    Spouse name: Not on file   Number of children: Not on file   Years of education: Not on file   Highest education level: Some college, no degree  Occupational History   Not on file  Tobacco Use   Smoking status: Former    Current packs/day: 0.00    Average packs/day: 1 pack/day for 10.0 years (10.0 ttl pk-yrs)    Types: Cigarettes    Start date: 13    Quit date: 64    Years since quitting: 37.9   Smokeless tobacco: Never  Vaping Use   Vaping status: Never Used  Substance and Sexual Activity   Alcohol use: Not Currently   Drug use: No   Sexual activity: Yes    Birth control/protection: Surgical  Other Topics Concern   Not on file  Social History Narrative   Patient resides with her spouse.  She has a son and a daughter, both of whom live within 1 hour  of her home.  She also has pets.  No fall risks however.  They are well-behaved.  She retired in June 2023.   Social Drivers of Health   Tobacco Use: Medium Risk (03/18/2024)   Patient History    Smoking Tobacco Use: Former    Smokeless Tobacco Use: Never    Passive Exposure: Not on file  Financial Resource Strain: Low Risk (02/06/2024)   Overall Financial Resource Strain (CARDIA)    Difficulty of Paying Living Expenses: Not hard at all  Food Insecurity: No Food Insecurity (02/06/2024)   Epic    Worried About Programme Researcher, Broadcasting/film/video in the Last Year: Never true    Ran Out of Food in the Last Year: Never true  Transportation Needs: No Transportation Needs (11/07/2023)   Epic    Lack of Transportation (Medical): No    Lack of Transportation (Non-Medical): No  Physical Activity: Insufficiently Active (02/06/2024)   Exercise Vital Sign    Days of Exercise per Week: 4 days    Minutes of Exercise per Session: 20 min  Stress: No Stress Concern Present (02/06/2024)    [employer] of Occupational Health - Occupational Stress Questionnaire    Feeling of Stress: Not at all  Social Connections: Socially Integrated (02/06/2024)   Social Connection and Isolation Panel    Frequency of Communication with Friends and Family: More than three times a week    Frequency of Social Gatherings with Friends and Family: Twice a week    Attends Religious Services: More than 4 times per year    Active Member of Clubs or Organizations: Yes    Attends Banker Meetings: More than 4 times per year    Marital Status: Married  Depression (PHQ2-9): Low Risk (02/07/2024)   Depression (PHQ2-9)    PHQ-2 Score: 0  Alcohol Screen: Low Risk (05/05/2023)   Alcohol Screen    Last Alcohol Screening Score (AUDIT): 0  Housing: Low Risk (02/06/2024)   Epic    Unable to Pay for Housing in the Last Year: No    Number of Times Moved in the Last Year: 0    Homeless in the Last Year: No  Utilities: Not At Risk (05/05/2023)   AHC Utilities    Threatened with loss of utilities: No  Health Literacy: Adequate Health Literacy (05/05/2023)   B1300 Health Literacy    Frequency of need for help with medical instructions: Never   Family History  Problem Relation Age of Onset   Thyroid  disease Mother    Diabetes Mother    Cancer Mother    Cancer Father    Lupus Daughter    Arthritis/Rheumatoid Daughter    Diabetes type II Daughter    Outpatient Encounter Medications as of 03/18/2024  Medication Sig   atorvastatin  (LIPITOR) 10 MG tablet Take 1 tablet (10 mg total) by mouth daily.   blood glucose meter kit and supplies KIT Dispense based on patient and insurance preference. Use up to four times daily as directed. (FOR ICD-9 250.00, 250.01). E11.9   cetirizine  (ZYRTEC ) 10 MG tablet Take by mouth as needed.   Cholecalciferol (VITAMIN D-3 PO) Take 1 tablet by mouth daily. (Patient not taking: Reported on 02/07/2024)   ciclopirox  (PENLAC ) 8 % solution Apply topically at bedtime.  Apply over nail and surrounding skin. Apply daily over previous coat. After seven (7) days, may remove with alcohol and continue cycle.   EPINEPHrine  0.3 mg/0.3 mL IJ SOAJ injection Inject 0.3 mg into the muscle as  needed for anaphylaxis.   fluorouracil  (EFUDEX ) 5 % cream Apply topically 2 (two) times daily. X2-4 weeks   fluticasone  (FLONASE ) 50 MCG/ACT nasal spray Place 2 sprays into both nostrils daily.   furosemide  (LASIX ) 20 MG tablet Take 1 tablet (20 mg total) by mouth daily as needed for edema or fluid.   levocetirizine (XYZAL ) 5 MG tablet Take 1 tablet (5 mg total) by mouth every evening.   meloxicam  (MOBIC ) 15 MG tablet Take 0.5-1 tablets (7.5-15 mg total) by mouth daily as needed for pain (arthritis).   mycophenolate (MYFORTIC) 360 MG TBEC EC tablet Take 360 mg by mouth 2 (two) times daily.   ondansetron  (ZOFRAN -ODT) 4 MG disintegrating tablet Take 1 tablet (4 mg total) by mouth every 8 (eight) hours as needed for nausea or vomiting.   pantoprazole  (PROTONIX ) 40 MG tablet Take 1 tablet (40 mg total) by mouth daily.   tirzepatide  (MOUNJARO ) 2.5 MG/0.5ML Pen Inject 2.5 mg into the skin once a week.   triamcinolone  cream (KENALOG ) 0.1 % Apply 1 Application topically 2 (two) times daily as needed (eczema flare for up to 7 days).   No facility-administered encounter medications on file as of 03/18/2024.   ALLERGIES: Allergies  Allergen Reactions   Fenofibrate Micronized Other (See Comments)    Caused increased LFT's  Caused increased LFT's    Caused increased LFT's, Caused increased LFT's, Caused increased LFT's, Caused increased LFT's   Glipizide      Hypoglycemia    Hydrocodone Nausea And Vomiting and Other (See Comments)    Makes body hot.  Makes body hot.     Makes body hot    Makes body hot. , Makes body hot   Alpha-Gal    Codeine  Nausea And Vomiting   Farxiga [Dapagliflozin]     Bladder pains   Janumet  [Sitagliptin  Phos-Metformin  Hcl]     Extremely bad gas     Tape     Can cause skin to tear    VACCINATION STATUS: Immunization History  Administered Date(s) Administered   Hep A / Hep B 03/24/2008, 04/23/2008, 09/08/2008   Influenza, Seasonal, Injecte, Preservative Fre 01/10/2008   Influenza,inj,Quad PF,6+ Mos 05/18/2016   Influenza-Unspecified 02/02/2018   PFIZER(Purple Top)SARS-COV-2 Vaccination 11/08/2019, 11/29/2019   PNEUMOCOCCAL CONJUGATE-20 10/29/2021   Pneumococcal Polysaccharide-23 11/30/2017   Zoster Recombinant(Shingrix) 12/12/2018, 06/12/2019    HPI Taylor Leach is 68 y.o. female who presents today with a medical history as above. she is being seen in follow-up after she was seen in consultation for hypercortisolemia requested by Taylor Norene HERO, DO.   She gives history of exposure to high-dose steroids related to her autoimmune hepatitis in the past.  Although her exposure ranges between 6 to 7 years Mizera or prednisone  as high as 60 mg daily at times. Due to her labs  ( ACTH  stim test) showing partial primary adrenal insuff , she was offered low-dose prednisone  5 mg p.o. daily which she took briefly but then she decided to stay away off of.   She has not taking any steroids over the last several months.  She presents with a to 20 pounds of weight loss mainly due to dietary changes and Mounjaro . She was initiated on Mounjaro  by  her PMD.  She denies family history of adrenal dysfunction.  Her other medical problems include type 2 diabetes and hyperlipidemia on treatment.  She does not have hypertension.    Her other current medications include Myfortic acid, Protonix , budesonide, vitamin D.  Her most recent A1c  measurements range between 5-8%. She did not tolerate metformin  in the past.   Review of Systems  Constitutional: + Weight loss,  - fatigue, no subjective hyperthermia, no subjective hypothermia Eyes: no blurry vision, no xerophthalmia   Objective:       03/18/2024    9:17 AM 02/07/2024    8:26 AM 02/07/2024     8:24 AM  Vitals with BMI  Height 5' 5  5' 5  Weight 177 lbs 10 oz  178 lbs 8 oz  BMI 29.55  29.7  Systolic 138 142 854  Diastolic 76 66 79  Pulse 68  74    BP 138/76   Pulse 68   Ht 5' 5 (1.651 m)   Wt 177 lb 9.6 oz (80.6 kg)   BMI 29.55 kg/m   Wt Readings from Last 3 Encounters:  03/18/24 177 lb 9.6 oz (80.6 kg)  02/07/24 178 lb 8 oz (81 kg)  11/08/23 190 lb 6.4 oz (86.4 kg)    Physical Exam  Constitutional:  Body mass index is 29.55 kg/m.,  not in acute distress, normal state of mind Eyes: PERRLA, EOMI, no exophthalmos ENT: moist mucous membranes, no gross thyromegaly, no gross cervical lymphadenopathy   CMP ( most recent) CMP     Component Value Date/Time   NA 140 03/07/2024 0844   K 3.7 03/07/2024 0844   CL 105 03/07/2024 0844   CO2 22 03/07/2024 0844   GLUCOSE 98 03/07/2024 0844   GLUCOSE 120 (H) 06/06/2022 1848   BUN 13 03/07/2024 0844   CREATININE 0.89 03/07/2024 0844   CALCIUM  9.5 03/07/2024 0844   PROT 7.8 03/07/2024 0844   ALBUMIN 4.1 03/07/2024 0844   AST 31 03/07/2024 0844   ALT 27 03/07/2024 0844   ALKPHOS 83 03/07/2024 0844   BILITOT 0.6 03/07/2024 0844   EGFR 71 03/07/2024 0844   GFRNONAA >60 06/06/2022 1848     Diabetic Labs (most recent): Lab Results  Component Value Date   HGBA1C 4.6 (L) 02/07/2024   HGBA1C 6.7 (H) 09/06/2023   HGBA1C 8.0 (H) 05/30/2023     Lipid Panel ( most recent) Lipid Panel     Component Value Date/Time   CHOL 133 02/07/2024 0812   TRIG 187 (H) 02/07/2024 0812   HDL 39 (L) 02/07/2024 0812   CHOLHDL 3.4 02/07/2024 0812   CHOLHDL 4.1 10/23/2010 1748   VLDL 40 10/23/2010 1748   LDLCALC 63 02/07/2024 0812   LABVLDL 31 02/07/2024 0812      Lab Results  Component Value Date   TSH 1.780 02/07/2024   TSH 2.440 08/05/2022   TSH 1.747 06/06/2022   TSH 1.990 07/16/2019   TSH 4.050 06/07/2019   FREET4 1.27 02/07/2024   FREET4 1.33 08/05/2022      Assessment & Plan:   Hypocortisolism  2.  Type 2  diabetes  3. Hyperlipidemia  - Taylor Leach  is being seen at a kind request of Taylor Potter M, DO. - I have reviewed her new and available  records and clinically evaluated the patient. 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 daily.  However, patient would like to stay off of it as long as she can.    She presents with a.m. cortisol 7.3 considered marginal.  She will have repeat labs and a.m. cortisol before  her next visit in 4 months.      She has several years of exposure to high-dose steroids orally to treat autoimmune hepatitis.  This seems to be her highest risk for adrenal insufficiency.  -Regarding her diagnosis of type 2 diabetes with A1c ranging between 5-8%.  She may continue to benefit from low-dose Mounjaro  being prescribed by her PCP.     I discussed dietary approach for her to avoid processed carbs and processed meat . She will have A1c measurement subsequently. Her LDL is controlled at 74. She is on Atorvastatin  10 mg po at bedtime.   - she is advised to maintain close follow up with Taylor Norene HERO, DO for primary care needs.    I spent  20  minutes in the care of the patient today including review of labs from Thyroid  Function, CMP, and other relevant labs ; imaging/biopsy records (current and previous including abstractions from other facilities); face-to-face time discussing  her lab results and symptoms, medications doses, her options of short and long term treatment based on the latest standards of care / guidelines;   and documenting the encounter.  Taylor Leach  participated in the discussions, expressed understanding, and voiced agreement with the above plans.  All questions were answered to her satisfaction. she is encouraged to contact clinic  should she have any questions or concerns prior to her return visit.   Follow up plan: Return in about 4 months (around 07/17/2024) for Fasting Labs  in AM B4 8.   Ranny Earl, MD [facility] [address] [place], [place] [address] Phone: [phone]  Fax: [phone]     03/18/2024, 12:38 PM  This note was partially dictated with voice recognition software. Similar sounding words can be transcribed inadequately or may not  be corrected upon review.

## 2024-04-15 ENCOUNTER — Encounter: Payer: Self-pay | Admitting: *Deleted

## 2024-05-02 ENCOUNTER — Other Ambulatory Visit

## 2024-05-02 DIAGNOSIS — Z780 Asymptomatic menopausal state: Secondary | ICD-10-CM

## 2024-05-02 DIAGNOSIS — M2550 Pain in unspecified joint: Secondary | ICD-10-CM

## 2024-05-02 DIAGNOSIS — E119 Type 2 diabetes mellitus without complications: Secondary | ICD-10-CM

## 2024-05-02 DIAGNOSIS — E2740 Unspecified adrenocortical insufficiency: Secondary | ICD-10-CM

## 2024-05-02 DIAGNOSIS — K754 Autoimmune hepatitis: Secondary | ICD-10-CM

## 2024-05-02 LAB — BAYER DCA HB A1C WAIVED: HB A1C (BAYER DCA - WAIVED): 5.7 % — ABNORMAL HIGH (ref 4.8–5.6)

## 2024-05-03 ENCOUNTER — Ambulatory Visit: Payer: Self-pay | Admitting: Family Medicine

## 2024-05-03 DIAGNOSIS — R7689 Other specified abnormal immunological findings in serum: Secondary | ICD-10-CM

## 2024-05-03 LAB — ANA W/REFLEX IF POSITIVE
Anti JO-1: <0.2 AI (ref 0.0–0.9)
Anti Nuclear Antibody (ANA): POSITIVE — AB
Centromere Ab Screen: 2.3 AI — ABNORMAL HIGH (ref 0.0–0.9)
Chromatin Ab SerPl-aCnc: <0.2 AI (ref 0.0–0.9)
ENA RNP Ab: <0.2 AI (ref 0.0–0.9)
ENA SM Ab Ser-aCnc: <0.2 AI (ref 0.0–0.9)
ENA SSA (RO) Ab: 0.2 AI (ref 0.0–0.9)
ENA SSB (LA) Ab: <0.2 AI (ref 0.0–0.9)
Scleroderma (Scl-70) (ENA) Antibody, IgG: <0.2 AI (ref 0.0–0.9)
dsDNA Ab: 2 [IU]/mL (ref 0–9)

## 2024-05-03 LAB — ARTHRITIS PANEL
Basophils Absolute: 0 10*3/uL (ref 0.0–0.2)
Basos: 0 %
EOS (ABSOLUTE): 0.1 10*3/uL (ref 0.0–0.4)
Eos: 3 %
Hematocrit: 42.3 % (ref 34.0–46.6)
Hemoglobin: 14.1 g/dL (ref 11.1–15.9)
Immature Grans (Abs): 0 10*3/uL (ref 0.0–0.1)
Immature Granulocytes: 0 %
Lymphocytes Absolute: 1.1 10*3/uL (ref 0.7–3.1)
Lymphs: 21 %
MCH: 31.1 pg (ref 26.6–33.0)
MCHC: 33.3 g/dL (ref 31.5–35.7)
MCV: 93 fL (ref 79–97)
Monocytes Absolute: 0.6 10*3/uL (ref 0.1–0.9)
Monocytes: 13 %
Neutrophils Absolute: 3.2 10*3/uL (ref 1.4–7.0)
Neutrophils: 63 %
Platelets: 142 10*3/uL — ABNORMAL LOW (ref 150–450)
RBC: 4.53 x10E6/uL (ref 3.77–5.28)
RDW: 13 % (ref 11.7–15.4)
Rheumatoid fact SerPl-aCnc: 10.8 [IU]/mL
Sed Rate: 43 mm/h — ABNORMAL HIGH (ref 0–40)
Uric Acid: 5.5 mg/dL (ref 3.0–7.2)
WBC: 5 10*3/uL (ref 3.4–10.8)

## 2024-05-03 LAB — HEPATIC FUNCTION PANEL
ALT: 24 [IU]/L (ref 0–32)
AST: 29 [IU]/L (ref 0–40)
Albumin: 3.8 g/dL — ABNORMAL LOW (ref 3.9–4.9)
Alkaline Phosphatase: 92 [IU]/L (ref 49–135)
Bilirubin Total: 0.5 mg/dL (ref 0.0–1.2)
Bilirubin, Direct: 0.15 mg/dL (ref 0.00–0.40)
Total Protein: 7.7 g/dL (ref 6.0–8.5)

## 2024-05-03 LAB — IGG: IgG (Immunoglobin G), Serum: 2458 mg/dL — ABNORMAL HIGH (ref 586–1602)

## 2024-05-06 ENCOUNTER — Telehealth: Payer: Self-pay | Admitting: Family Medicine

## 2024-05-06 ENCOUNTER — Ambulatory Visit: Admitting: Family Medicine

## 2024-05-06 ENCOUNTER — Other Ambulatory Visit

## 2024-05-06 NOTE — Telephone Encounter (Signed)
 Spoke with patient to change her AWV.   She asked that someone contact her to reschedule her DEXA that was scheduled for today. Thank you,  Corean,  AMB Clinical Support [facility] AWV Program Direct Dial ??[phone]

## 2024-05-07 ENCOUNTER — Ambulatory Visit: Payer: Medicare Other

## 2024-05-07 VITALS — BP 138/76 | HR 68 | Ht 65.0 in | Wt 177.0 lb

## 2024-05-07 DIAGNOSIS — Z0000 Encounter for general adult medical examination without abnormal findings: Secondary | ICD-10-CM | POA: Diagnosis not present

## 2024-05-07 NOTE — Progress Notes (Signed)
 "  Chief Complaint  Patient presents with   Medicare Wellness     Subjective:   Taylor Leach is a 69 y.o. female who presents for a Medicare Annual Wellness Visit.  Visit info / Clinical Intake: Medicare Wellness Visit Type:: Subsequent Annual Wellness Visit Persons participating in visit and providing information:: patient Medicare Wellness Visit Mode:: Video Since this visit was completed virtually, some vitals may be partially provided or unavailable. Missing vitals are due to the limitations of the virtual format.: Unable to obtain vitals - no equipment If Telephone or Video please confirm:: I connected with patient using audio/video enable telemedicine. I verified patient identity with two identifiers, discussed telehealth limitations, and patient agreed to proceed. Patient Location:: home Provider Location:: home office Interpreter Needed?: No Pre-visit prep was completed: yes AWV questionnaire completed by patient prior to visit?: no Living arrangements:: lives with spouse/significant other Patient's Overall Health Status Rating: very good Typical amount of pain: none Does pain affect daily life?: no Are you currently prescribed opioids?: no  Dietary Habits and Nutritional Risks How many meals a day?: 3 Eats fruit and vegetables daily?: yes Most meals are obtained by: preparing own meals In the last 2 weeks, have you had any of the following?: none Diabetic:: (!) yes Any non-healing wounds?: no How often do you check your BS?: 4 Would you like to be referred to a Nutritionist or for Diabetic Management? : no  Functional Status Activities of Daily Living (to include ambulation/medication): Independent Ambulation: Independent Medication Administration: Independent Home Management (perform basic housework or laundry): Independent Manage your own finances?: yes Primary transportation is: driving Concerns about vision?: no *vision screening is required for WTM*  (7/25) Concerns about hearing?: no  Fall Screening Falls in the past year?: 0 Number of falls in past year: 0 Was there an injury with Fall?: 0 Fall Risk Category Calculator: 0 Patient Fall Risk Level: Low Fall Risk  Fall Risk Patient at Risk for Falls Due to: No Fall Risks Fall risk Follow up: Falls evaluation completed; Education provided  Home and Transportation Safety: All rugs have non-skid backing?: yes All stairs or steps have railings?: yes Grab bars in the bathtub or shower?: yes Have non-skid surface in bathtub or shower?: yes Good home lighting?: yes Regular seat belt use?: yes Hospital stays in the last year:: no  Cognitive Assessment Difficulty concentrating, remembering, or making decisions? : no Will 6CIT or Mini Cog be Completed: yes What year is it?: 0 points What month is it?: 0 points Give patient an address phrase to remember (5 components): [address] [place] About what time is it?: 0 points Count backwards from 20 to 1: 0 points Say the months of the year in reverse: 0 points Repeat the address phrase from earlier: 0 points 6 CIT Score: 0 points  Advance Directives (For Healthcare) Does Patient Have a Medical Advance Directive?: No Would patient like information on creating a medical advance directive?: No - Patient declined  Reviewed/Updated  Reviewed/Updated: Reviewed All (Medical, Surgical, Family, Medications, Allergies, Care Teams, Patient Goals)    Allergies (verified) Fenofibrate micronized, Glipizide , Hydrocodone, Alpha-gal, Codeine , Farxiga [dapagliflozin], Janumet  [sitagliptin  phos-metformin  hcl], and Tape   Current Medications (verified) Outpatient Encounter Medications as of 05/07/2024  Medication Sig   atorvastatin  (LIPITOR) 10 MG tablet Take 1 tablet (10 mg total) by mouth daily.   blood glucose meter kit and supplies KIT Dispense based on patient and insurance preference. Use up to four times daily as directed. (  FOR  ICD-9 250.00, 250.01). E11.9   ciclopirox  (PENLAC ) 8 % solution Apply topically at bedtime. Apply over nail and surrounding skin. Apply daily over previous coat. After seven (7) days, may remove with alcohol and continue cycle.   EPINEPHrine  0.3 mg/0.3 mL IJ SOAJ injection Inject 0.3 mg into the muscle as needed for anaphylaxis.   fluorouracil  (EFUDEX ) 5 % cream Apply topically 2 (two) times daily. X2-4 weeks   fluticasone  (FLONASE ) 50 MCG/ACT nasal spray Place 2 sprays into both nostrils daily.   furosemide  (LASIX ) 20 MG tablet Take 1 tablet (20 mg total) by mouth daily as needed for edema or fluid.   levocetirizine (XYZAL ) 5 MG tablet Take 1 tablet (5 mg total) by mouth every evening.   meloxicam  (MOBIC ) 15 MG tablet Take 0.5-1 tablets (7.5-15 mg total) by mouth daily as needed for pain (arthritis).   mycophenolate (MYFORTIC) 360 MG TBEC EC tablet Take 360 mg by mouth 2 (two) times daily.   ondansetron  (ZOFRAN -ODT) 4 MG disintegrating tablet Take 1 tablet (4 mg total) by mouth every 8 (eight) hours as needed for nausea or vomiting.   pantoprazole  (PROTONIX ) 40 MG tablet Take 1 tablet (40 mg total) by mouth daily.   tirzepatide  (MOUNJARO ) 2.5 MG/0.5ML Pen Inject 2.5 mg into the skin once a week.   triamcinolone  cream (KENALOG ) 0.1 % Apply 1 Application topically 2 (two) times daily as needed (eczema flare for up to 7 days).   cetirizine  (ZYRTEC ) 10 MG tablet Take by mouth as needed.   Cholecalciferol (VITAMIN D-3 PO) Take 1 tablet by mouth daily. (Patient not taking: Reported on 02/07/2024)   [DISCONTINUED] atorvastatin  (LIPITOR) 10 MG tablet Take 1 tablet (10 mg total) by mouth daily.   [DISCONTINUED] ciclopirox  (PENLAC ) 8 % solution Apply topically at bedtime. Apply over nail and surrounding skin. Apply daily over previous coat. After seven (7) days, may remove with alcohol and continue cycle.   [DISCONTINUED] CONTOUR NEXT TEST test strip CHECK BLOOD SUGAR UP TO 4 TIMES A DAY OR AS DIRECTED    [DISCONTINUED] fluticasone  (FLONASE ) 50 MCG/ACT nasal spray Place 1 spray into both nostrils 2 (two) times daily as needed for allergies or rhinitis.   [DISCONTINUED] ofloxacin  (FLOXIN ) 0.3 % OTIC solution Place 5 drops into the left ear daily.   [DISCONTINUED] pantoprazole  (PROTONIX ) 40 MG tablet Take 1 tablet (40 mg total) by mouth daily.   [DISCONTINUED] predniSONE  (DELTASONE ) 5 MG tablet Take 1 tablet (5 mg total) by mouth daily with breakfast.   [DISCONTINUED] tirzepatide  (MOUNJARO ) 2.5 MG/0.5ML Pen Inject 2.5 mg into the skin once a week.   [DISCONTINUED] tirzepatide  (MOUNJARO ) 5 MG/0.5ML Pen Inject 5 mg into the skin once a week. Month #2   No facility-administered encounter medications on file as of 05/07/2024.    History: Past Medical History:  Diagnosis Date   Arthritis    hands   Atypical chest pain 04/14/2022   See cardiology OV note from 04/14/22 in Epic. Atypical chest pain thought to be related to a hiatal hernia.   Autoimmune hepatitis (HCC)    Follows w/ gastroenterology. LOV 12/14/21 with [name], AGNP (as of 04/18/22) in Epic.   Chest pain    Stress echo normal, October, 2012   COVID-19 03/2021   flu like symptoms, patient was prescribed an antiviral   Diabetes mellitus (HCC) 07/28/2016   Type 2, follows w/ PCP, Dr. Rosina Fielding, LOV 01/28/22 (as of 0/15/24.) HgbA1c 6.8 on 01/28/22.   Dyslipidemia    Follows w/  PCP.   Ejection fraction    EF 55-60%, echo, October, 2012   GERD (gastroesophageal reflux disease)    Hiatal hernia    found during EGD   Idiopathic neuropathy    feet   NASH (nonalcoholic steatohepatitis)    chronic, follows w/ gastro, lov 12/14/21 in Epic   Wears glasses    Past Surgical History:  Procedure Laterality Date   CHOLECYSTECTOMY  2008   COLONOSCOPY  06/10/2019   multiple colon polyps   CYSTOSCOPY N/A 04/20/2022   Procedure: CYSTOSCOPY;  Surgeon: Marilynne Rosaline SAILOR, MD;  [facility];  Service:  Gynecology;  Laterality: N/A;   ESOPHAGOGASTRODUODENOSCOPY     around 2018   EYE SURGERY Bilateral 07/15/2021   eye lids   LIVER BIOPSY  2005   LIVER BIOPSY  2019   steatohepatitis & moderate lymphoplasmacytic infiltrate & stage 2 -3 fibrosis   NASAL SINUS SURGERY     around 2001   [facility]   ROBOTIC ASSISTED LAPAROSCOPIC SACROCOLPOPEXY N/A 04/20/2022   Procedure: XI ROBOTIC ASSISTED LAPAROSCOPIC SACROCOLPOPEXY;  Surgeon: Marilynne Rosaline SAILOR, MD;  Location[facility];  Service: Gynecology;  Laterality: N/A;   VAGINAL HYSTERECTOMY  1999   Family History  Problem Relation Age of Onset   Thyroid  disease Mother    Diabetes Mother    Cancer Mother    Cancer Father    Lupus Daughter    Arthritis/Rheumatoid Daughter    Diabetes type II Daughter    Social History   Occupational History   Not on file  Tobacco Use   Smoking status: Former    Current packs/day: 0.00    Average packs/day: 1 pack/day for 10.0 years (10.0 ttl pk-yrs)    Types: Cigarettes    Start date: 31    Quit date: 59    Years since quitting: 38.1   Smokeless tobacco: Never  Vaping Use   Vaping status: Never Used  Substance and Sexual Activity   Alcohol use: Not Currently   Drug use: No   Sexual activity: Yes    Birth control/protection: Surgical   Tobacco Counseling Counseling given: Yes  SDOH Screenings   Food Insecurity: No Food Insecurity (05/07/2024)  Housing: Low Risk (05/07/2024)  Transportation Needs: No Transportation Needs (05/07/2024)  Utilities: Not At Risk (05/07/2024)  Alcohol Screen: Low Risk (05/05/2023)  Depression (PHQ2-9): Low Risk (05/07/2024)  Financial Resource Strain: Low Risk (02/06/2024)  Physical Activity: Insufficiently Active (05/07/2024)  Social Connections: Socially Integrated (05/07/2024)  Stress: No Stress Concern Present (05/07/2024)  Tobacco Use: Medium Risk (05/07/2024)  Health Literacy: Adequate Health  Literacy (05/07/2024)   See flowsheets for full screening details  Depression Screen PHQ 2 & 9 Depression Scale- Over the past 2 weeks, how often have you been bothered by any of the following problems? Little interest or pleasure in doing things: 0 Feeling down, depressed, or hopeless (PHQ Adolescent also includes...irritable): 0 PHQ-2 Total Score: 0 Trouble falling or staying asleep, or sleeping too much: 0 Feeling tired or having little energy: 0 Poor appetite or overeating (PHQ Adolescent also includes...weight loss): 0 Feeling bad about yourself - or that you are a failure or have let yourself or your family down: 0 Trouble concentrating on things, such as reading the newspaper or watching television (PHQ Adolescent also includes...like school work): 0 Moving or speaking so slowly that other people could have noticed. Or the opposite - being so fidgety  or restless that you have been moving around a lot more than usual: 0 Thoughts that you would be better off dead, or of hurting yourself in some way: 0 PHQ-9 Total Score: 0 If you checked off any problems, how difficult have these problems made it for you to do your work, take care of things at home, or get along with other people?: Not difficult at all     Goals Addressed   None          Objective:    Today's Vitals   05/07/24 1022  BP: 138/76  Pulse: 68  Weight: 177 lb (80.3 kg)  Height: 5' 5 (1.651 m)   Body mass index is 29.45 kg/m.  Hearing/Vision screen No results found. Immunizations and Health Maintenance Health Maintenance  Topic Date Due   Hepatitis C Screening  Never done   DTaP/Tdap/Td (1 - Tdap) Never done   COVID-19 Vaccine (3 - Pfizer risk series) 12/27/2019   Bone Density Scan  06/16/2023   Influenza Vaccine  07/02/2024 (Originally 11/03/2023)   Diabetic kidney evaluation - Urine ACR  09/05/2024   Mammogram  09/05/2024   OPHTHALMOLOGY EXAM  10/16/2024   HEMOGLOBIN A1C  10/30/2024   FOOT EXAM   02/06/2025   Diabetic kidney evaluation - eGFR measurement  03/07/2025   Medicare Annual Wellness (AWV)  05/07/2025   Colonoscopy  04/03/2033   Pneumococcal Vaccine: 50+ Years  Completed   Zoster Vaccines- Shingrix  Completed   Meningococcal B Vaccine  Aged Out   Hepatitis B Vaccines 19-59 Average Risk  Discontinued        Assessment/Plan:  This is a routine wellness examination for [facility].  Patient Care Team: [name], DO as PCP - General (Family Medicine) Alvan Dorn FALCON, MD as PCP - Cardiology (Cardiology) Billee Mliss BIRCH, RPH-CPP as Pharmacist (Family Medicine) Yoxheimer, Manuelita DASEN, NP as Nurse Practitioner (Nurse Practitioner) Dr. Selinda Reusing as Consulting Physician (Ophthalmology) Dr. Lenis as Consulting Physician (Endocrinology)  I have personally reviewed and noted the following in the patients chart:   Medical and social history Use of alcohol, tobacco or illicit drugs  Current medications and supplements including opioid prescriptions. Functional ability and status Nutritional status Physical activity Advanced directives List of other physicians Hospitalizations, surgeries, and ER visits in previous 12 months Vitals Screenings to include cognitive, depression, and falls Referrals and appointments  No orders of the defined types were placed in this encounter.  In addition, I have reviewed and discussed with patient certain preventive protocols, quality metrics, and best practice recommendations. A written personalized care plan for preventive services as well as general preventive health recommendations were provided to patient.   Ozie Ned, CMA   05/07/2024   Return in 1 year (on 05/07/2025).  After Visit Summary: (MyChart) Due to this being a telephonic visit, the after visit summary with patients personalized plan was offered to patient via MyChart   Nurse Notes: Pt is aware and due the following: pt declined-Covid and flu vaccine, pt will bring  update for Hep C next OV w/NP-in [facility], Pt had to reschedule apt for Dexa due to weather--will try to get it on 2/18, pt will discuss getting the DTAP w/provider due to her Alha-gal. "

## 2024-05-23 ENCOUNTER — Other Ambulatory Visit

## 2024-05-23 ENCOUNTER — Ambulatory Visit: Admitting: Family Medicine

## 2024-06-19 ENCOUNTER — Ambulatory Visit: Admitting: Family Medicine

## 2024-07-09 ENCOUNTER — Ambulatory Visit: Admitting: Cardiology

## 2024-07-17 ENCOUNTER — Ambulatory Visit: Admitting: "Endocrinology
